# Patient Record
Sex: Male | Born: 1935 | Race: White | Hispanic: No | Marital: Married | Smoking: Never smoker
Health system: Southern US, Community
[De-identification: ages and names within clinical notes are randomized; demographics above are authoritative.]

## PROBLEM LIST (undated history)

## (undated) DIAGNOSIS — I712 Thoracic aortic aneurysm, without rupture, unspecified: Secondary | ICD-10-CM

## (undated) DIAGNOSIS — Z952 Presence of prosthetic heart valve: Secondary | ICD-10-CM

## (undated) DIAGNOSIS — Z9289 Personal history of other medical treatment: Secondary | ICD-10-CM

## (undated) DIAGNOSIS — Z950 Presence of cardiac pacemaker: Secondary | ICD-10-CM

## (undated) DIAGNOSIS — I719 Aortic aneurysm of unspecified site, without rupture: Secondary | ICD-10-CM

## (undated) DIAGNOSIS — K922 Gastrointestinal hemorrhage, unspecified: Secondary | ICD-10-CM

## (undated) DIAGNOSIS — Z8739 Personal history of other diseases of the musculoskeletal system and connective tissue: Secondary | ICD-10-CM

## (undated) DIAGNOSIS — R972 Elevated prostate specific antigen [PSA]: Secondary | ICD-10-CM

## (undated) DIAGNOSIS — G453 Amaurosis fugax: Secondary | ICD-10-CM

## (undated) DIAGNOSIS — E039 Hypothyroidism, unspecified: Secondary | ICD-10-CM

## (undated) DIAGNOSIS — I471 Supraventricular tachycardia, unspecified: Secondary | ICD-10-CM

## (undated) DIAGNOSIS — I351 Nonrheumatic aortic (valve) insufficiency: Secondary | ICD-10-CM

## (undated) DIAGNOSIS — I509 Heart failure, unspecified: Secondary | ICD-10-CM

## (undated) DIAGNOSIS — G5600 Carpal tunnel syndrome, unspecified upper limb: Secondary | ICD-10-CM

## (undated) DIAGNOSIS — I4891 Unspecified atrial fibrillation: Secondary | ICD-10-CM

## (undated) DIAGNOSIS — N400 Enlarged prostate without lower urinary tract symptoms: Secondary | ICD-10-CM

## (undated) DIAGNOSIS — D649 Anemia, unspecified: Secondary | ICD-10-CM

## (undated) HISTORY — DX: Amaurosis fugax: G45.3

## (undated) HISTORY — PX: INGUINAL HERNIA REPAIR: SUR1180

## (undated) HISTORY — DX: Elevated prostate specific antigen (PSA): R97.20

## (undated) HISTORY — DX: Supraventricular tachycardia: I47.1

## (undated) HISTORY — DX: Hypothyroidism, unspecified: E03.9

## (undated) HISTORY — PX: JOINT REPLACEMENT: SHX530

## (undated) HISTORY — PX: CARPAL TUNNEL RELEASE: SHX101

## (undated) HISTORY — PX: TOTAL KNEE ARTHROPLASTY: SHX125

## (undated) HISTORY — DX: Carpal tunnel syndrome, unspecified upper limb: G56.00

## (undated) HISTORY — DX: Nonrheumatic aortic (valve) insufficiency: I35.1

## (undated) HISTORY — DX: Aortic aneurysm of unspecified site, without rupture: I71.9

## (undated) HISTORY — DX: Thoracic aortic aneurysm, without rupture: I71.2

## (undated) HISTORY — DX: Thoracic aortic aneurysm, without rupture, unspecified: I71.20

## (undated) HISTORY — DX: Supraventricular tachycardia, unspecified: I47.10

## (undated) HISTORY — PX: INSERT / REPLACE / REMOVE PACEMAKER: SUR710

## (undated) HISTORY — DX: Presence of prosthetic heart valve: Z95.2

## (undated) HISTORY — DX: Benign prostatic hyperplasia without lower urinary tract symptoms: N40.0

## (undated) HISTORY — PX: MEDIAN STERNOTOMY: SUR860

---

## 1998-04-20 ENCOUNTER — Other Ambulatory Visit: Admission: RE | Admit: 1998-04-20 | Discharge: 1998-04-20 | Payer: Self-pay | Admitting: Family Medicine

## 2000-07-26 ENCOUNTER — Ambulatory Visit (HOSPITAL_BASED_OUTPATIENT_CLINIC_OR_DEPARTMENT_OTHER): Admission: RE | Admit: 2000-07-26 | Discharge: 2000-07-26 | Payer: Self-pay | Admitting: Orthopedic Surgery

## 2001-04-30 ENCOUNTER — Other Ambulatory Visit: Admission: RE | Admit: 2001-04-30 | Discharge: 2001-04-30 | Payer: Self-pay | Admitting: Urology

## 2001-04-30 ENCOUNTER — Encounter (INDEPENDENT_AMBULATORY_CARE_PROVIDER_SITE_OTHER): Payer: Self-pay

## 2005-01-04 ENCOUNTER — Ambulatory Visit: Payer: Self-pay | Admitting: Cardiology

## 2005-01-13 ENCOUNTER — Ambulatory Visit: Payer: Self-pay

## 2005-01-14 ENCOUNTER — Encounter: Admission: RE | Admit: 2005-01-14 | Discharge: 2005-01-14 | Payer: Self-pay | Admitting: Cardiology

## 2005-01-17 ENCOUNTER — Ambulatory Visit: Payer: Self-pay | Admitting: Cardiology

## 2005-01-19 ENCOUNTER — Encounter (INDEPENDENT_AMBULATORY_CARE_PROVIDER_SITE_OTHER): Payer: Self-pay | Admitting: Specialist

## 2005-01-19 ENCOUNTER — Inpatient Hospital Stay (HOSPITAL_COMMUNITY): Admission: AD | Admit: 2005-01-19 | Discharge: 2005-01-27 | Payer: Self-pay | Admitting: Cardiology

## 2005-01-19 DIAGNOSIS — Z952 Presence of prosthetic heart valve: Secondary | ICD-10-CM

## 2005-01-19 HISTORY — PX: MECHANICAL AORTIC VALVE REPLACEMENT: SHX2013

## 2005-01-19 HISTORY — DX: Presence of prosthetic heart valve: Z95.2

## 2005-01-19 HISTORY — PX: ASCENDING AORTIC ANEURYSM REPAIR W/ MECHANICAL AORTIC VALVE REPLACEMENT: SHX1192

## 2005-01-24 ENCOUNTER — Encounter: Payer: Self-pay | Admitting: Cardiology

## 2005-02-10 ENCOUNTER — Ambulatory Visit: Payer: Self-pay | Admitting: Cardiology

## 2005-02-11 ENCOUNTER — Encounter: Payer: Self-pay | Admitting: Cardiology

## 2005-02-11 ENCOUNTER — Inpatient Hospital Stay (HOSPITAL_COMMUNITY): Admission: EM | Admit: 2005-02-11 | Discharge: 2005-02-17 | Payer: Self-pay | Admitting: Emergency Medicine

## 2005-02-21 ENCOUNTER — Ambulatory Visit: Payer: Self-pay | Admitting: Internal Medicine

## 2005-02-21 ENCOUNTER — Ambulatory Visit: Payer: Self-pay | Admitting: Cardiology

## 2005-03-23 ENCOUNTER — Ambulatory Visit: Payer: Self-pay | Admitting: Cardiology

## 2005-04-20 ENCOUNTER — Ambulatory Visit: Payer: Self-pay | Admitting: Cardiology

## 2005-06-09 ENCOUNTER — Ambulatory Visit: Payer: Self-pay | Admitting: Internal Medicine

## 2005-06-16 ENCOUNTER — Ambulatory Visit: Payer: Self-pay | Admitting: Cardiology

## 2005-07-11 ENCOUNTER — Ambulatory Visit: Payer: Self-pay | Admitting: Internal Medicine

## 2005-07-26 ENCOUNTER — Ambulatory Visit: Payer: Self-pay | Admitting: Internal Medicine

## 2005-08-16 ENCOUNTER — Ambulatory Visit: Payer: Self-pay | Admitting: Internal Medicine

## 2005-08-25 ENCOUNTER — Ambulatory Visit (HOSPITAL_COMMUNITY): Admission: RE | Admit: 2005-08-25 | Discharge: 2005-08-25 | Payer: Self-pay | Admitting: Neurology

## 2005-08-25 ENCOUNTER — Ambulatory Visit: Payer: Self-pay

## 2005-09-01 ENCOUNTER — Ambulatory Visit: Payer: Self-pay | Admitting: Cardiology

## 2005-09-15 ENCOUNTER — Ambulatory Visit: Payer: Self-pay | Admitting: Cardiology

## 2006-01-11 ENCOUNTER — Ambulatory Visit: Payer: Self-pay | Admitting: Cardiology

## 2006-03-23 ENCOUNTER — Ambulatory Visit: Payer: Self-pay

## 2006-08-21 ENCOUNTER — Ambulatory Visit: Payer: Self-pay | Admitting: Cardiology

## 2006-08-31 ENCOUNTER — Encounter: Payer: Self-pay | Admitting: Cardiology

## 2006-08-31 ENCOUNTER — Ambulatory Visit: Payer: Self-pay

## 2006-10-11 ENCOUNTER — Ambulatory Visit: Payer: Self-pay | Admitting: Internal Medicine

## 2007-01-03 ENCOUNTER — Ambulatory Visit: Payer: Self-pay | Admitting: Internal Medicine

## 2007-04-04 ENCOUNTER — Ambulatory Visit: Payer: Self-pay | Admitting: Internal Medicine

## 2007-07-04 ENCOUNTER — Ambulatory Visit: Payer: Self-pay | Admitting: Internal Medicine

## 2007-08-03 ENCOUNTER — Ambulatory Visit: Payer: Self-pay | Admitting: Cardiology

## 2007-08-10 ENCOUNTER — Ambulatory Visit: Payer: Self-pay

## 2007-08-10 ENCOUNTER — Encounter: Payer: Self-pay | Admitting: Cardiology

## 2007-08-23 ENCOUNTER — Ambulatory Visit: Payer: Self-pay | Admitting: Cardiology

## 2007-10-02 ENCOUNTER — Ambulatory Visit: Payer: Self-pay | Admitting: Internal Medicine

## 2008-01-02 ENCOUNTER — Ambulatory Visit: Payer: Self-pay | Admitting: Internal Medicine

## 2008-03-04 ENCOUNTER — Ambulatory Visit: Payer: Self-pay | Admitting: Cardiology

## 2008-04-02 ENCOUNTER — Ambulatory Visit: Payer: Self-pay | Admitting: Internal Medicine

## 2008-07-02 ENCOUNTER — Ambulatory Visit: Payer: Self-pay | Admitting: Internal Medicine

## 2008-08-01 ENCOUNTER — Ambulatory Visit: Payer: Self-pay | Admitting: Cardiology

## 2008-08-12 ENCOUNTER — Ambulatory Visit: Payer: Self-pay

## 2008-08-12 ENCOUNTER — Encounter: Payer: Self-pay | Admitting: Cardiology

## 2008-08-12 ENCOUNTER — Ambulatory Visit: Payer: Self-pay | Admitting: Cardiology

## 2008-10-01 ENCOUNTER — Ambulatory Visit: Payer: Self-pay

## 2008-12-31 ENCOUNTER — Ambulatory Visit: Payer: Self-pay | Admitting: Internal Medicine

## 2009-04-01 ENCOUNTER — Ambulatory Visit: Payer: Self-pay | Admitting: Internal Medicine

## 2009-04-07 ENCOUNTER — Encounter: Payer: Self-pay | Admitting: Internal Medicine

## 2009-04-10 ENCOUNTER — Ambulatory Visit: Payer: Self-pay | Admitting: Internal Medicine

## 2009-04-22 ENCOUNTER — Ambulatory Visit (HOSPITAL_COMMUNITY): Admission: RE | Admit: 2009-04-22 | Discharge: 2009-04-22 | Payer: Self-pay | Admitting: Cardiology

## 2009-05-26 ENCOUNTER — Telehealth: Payer: Self-pay | Admitting: Cardiology

## 2009-07-01 ENCOUNTER — Ambulatory Visit: Payer: Self-pay | Admitting: Internal Medicine

## 2009-07-09 ENCOUNTER — Encounter: Payer: Self-pay | Admitting: Internal Medicine

## 2009-09-05 DIAGNOSIS — H3400 Transient retinal artery occlusion, unspecified eye: Secondary | ICD-10-CM

## 2009-09-05 DIAGNOSIS — I359 Nonrheumatic aortic valve disorder, unspecified: Secondary | ICD-10-CM | POA: Insufficient documentation

## 2009-09-05 DIAGNOSIS — Z8679 Personal history of other diseases of the circulatory system: Secondary | ICD-10-CM | POA: Insufficient documentation

## 2009-09-05 DIAGNOSIS — E039 Hypothyroidism, unspecified: Secondary | ICD-10-CM | POA: Insufficient documentation

## 2009-09-05 DIAGNOSIS — Z9889 Other specified postprocedural states: Secondary | ICD-10-CM

## 2009-09-05 DIAGNOSIS — I509 Heart failure, unspecified: Secondary | ICD-10-CM | POA: Insufficient documentation

## 2009-09-05 DIAGNOSIS — I712 Thoracic aortic aneurysm, without rupture: Secondary | ICD-10-CM

## 2009-09-05 DIAGNOSIS — I079 Rheumatic tricuspid valve disease, unspecified: Secondary | ICD-10-CM | POA: Insufficient documentation

## 2009-09-05 DIAGNOSIS — I517 Cardiomegaly: Secondary | ICD-10-CM

## 2009-09-05 DIAGNOSIS — I10 Essential (primary) hypertension: Secondary | ICD-10-CM | POA: Insufficient documentation

## 2009-09-05 DIAGNOSIS — Z9861 Coronary angioplasty status: Secondary | ICD-10-CM

## 2009-09-05 DIAGNOSIS — Z87898 Personal history of other specified conditions: Secondary | ICD-10-CM

## 2009-09-05 DIAGNOSIS — R0602 Shortness of breath: Secondary | ICD-10-CM | POA: Insufficient documentation

## 2009-09-05 DIAGNOSIS — I498 Other specified cardiac arrhythmias: Secondary | ICD-10-CM

## 2009-09-05 DIAGNOSIS — I080 Rheumatic disorders of both mitral and aortic valves: Secondary | ICD-10-CM

## 2009-09-05 DIAGNOSIS — Z96659 Presence of unspecified artificial knee joint: Secondary | ICD-10-CM

## 2009-09-05 DIAGNOSIS — Z950 Presence of cardiac pacemaker: Secondary | ICD-10-CM

## 2009-09-07 ENCOUNTER — Ambulatory Visit: Payer: Self-pay | Admitting: Cardiology

## 2009-09-07 DIAGNOSIS — Z954 Presence of other heart-valve replacement: Secondary | ICD-10-CM | POA: Insufficient documentation

## 2009-09-07 DIAGNOSIS — I428 Other cardiomyopathies: Secondary | ICD-10-CM | POA: Insufficient documentation

## 2009-10-06 ENCOUNTER — Ambulatory Visit: Payer: Self-pay | Admitting: Internal Medicine

## 2009-10-06 ENCOUNTER — Ambulatory Visit (HOSPITAL_COMMUNITY): Admission: RE | Admit: 2009-10-06 | Discharge: 2009-10-06 | Payer: Self-pay | Admitting: Cardiology

## 2009-10-06 ENCOUNTER — Encounter: Payer: Self-pay | Admitting: Cardiology

## 2009-10-06 ENCOUNTER — Ambulatory Visit: Payer: Self-pay | Admitting: Cardiology

## 2009-10-06 ENCOUNTER — Ambulatory Visit: Payer: Self-pay

## 2010-01-06 ENCOUNTER — Ambulatory Visit: Payer: Self-pay | Admitting: Internal Medicine

## 2010-01-12 ENCOUNTER — Encounter: Payer: Self-pay | Admitting: Internal Medicine

## 2010-04-07 ENCOUNTER — Ambulatory Visit: Payer: Self-pay | Admitting: Internal Medicine

## 2010-04-07 ENCOUNTER — Encounter: Payer: Self-pay | Admitting: Internal Medicine

## 2010-04-14 ENCOUNTER — Encounter: Payer: Self-pay | Admitting: Internal Medicine

## 2010-07-08 ENCOUNTER — Ambulatory Visit: Payer: Self-pay | Admitting: Internal Medicine

## 2010-07-15 ENCOUNTER — Encounter: Payer: Self-pay | Admitting: Internal Medicine

## 2010-09-20 ENCOUNTER — Ambulatory Visit: Payer: Self-pay | Admitting: Cardiology

## 2010-10-19 ENCOUNTER — Ambulatory Visit: Payer: Self-pay | Admitting: Internal Medicine

## 2010-10-28 ENCOUNTER — Ambulatory Visit: Payer: Self-pay | Admitting: Cardiology

## 2010-10-28 ENCOUNTER — Encounter: Payer: Self-pay | Admitting: Cardiology

## 2010-10-28 ENCOUNTER — Ambulatory Visit (HOSPITAL_COMMUNITY): Admission: RE | Admit: 2010-10-28 | Discharge: 2010-10-28 | Payer: Self-pay | Admitting: Cardiology

## 2010-10-28 ENCOUNTER — Ambulatory Visit: Payer: Self-pay

## 2011-01-20 ENCOUNTER — Encounter: Payer: Self-pay | Admitting: Internal Medicine

## 2011-01-20 ENCOUNTER — Ambulatory Visit
Admission: RE | Admit: 2011-01-20 | Discharge: 2011-01-20 | Payer: Self-pay | Source: Home / Self Care | Attending: Internal Medicine | Admitting: Internal Medicine

## 2011-01-23 LAB — CONVERTED CEMR LAB
Basophils Relative: 0.8 % (ref 0.0–3.0)
Eosinophils Absolute: 0.2 10*3/uL (ref 0.0–0.7)
Eosinophils Relative: 2.6 % (ref 0.0–5.0)
Hemoglobin: 15.3 g/dL (ref 13.0–17.0)
Lymphocytes Relative: 22.9 % (ref 12.0–46.0)
MCHC: 34.2 g/dL (ref 30.0–36.0)
Monocytes Relative: 10.4 % (ref 3.0–12.0)
Neutro Abs: 4 10*3/uL (ref 1.4–7.7)
Neutrophils Relative %: 63.3 % (ref 43.0–77.0)
RBC: 4.82 M/uL (ref 4.22–5.81)
TSH: 2.84 microintl units/mL (ref 0.35–5.50)
WBC: 6.4 10*3/uL (ref 4.5–10.5)

## 2011-01-27 NOTE — Cardiovascular Report (Signed)
Summary: Office Visit Remote  Office Visit Remote   Imported By: Roderic Ovens 04/14/2010 14:57:25  _____________________________________________________________________  External Attachment:    Type:   Image     Comment:   External Document

## 2011-01-27 NOTE — Cardiovascular Report (Signed)
Summary: Office Visit   Office Visit   Imported By: Roderic Ovens 10/29/2010 09:52:15  _____________________________________________________________________  External Attachment:    Type:   Image     Comment:   External Document

## 2011-01-27 NOTE — Assessment & Plan Note (Signed)
Summary: [employer]/saf   Visit Type:  Follow-up   History of Present Illness: [name] [name] is a gentleman who has a history of Bentall procedure with a St. Jude aortic valve replacement as well as pacemaker placement  in 2006.  His most recent echocardiogram was performed in October of 2010.  At that time, he was found to have an ejection fraction of 50-55%.  He denies any dyspnea on exertion, orthopnea, PND, pedal edema, palpitations, syncope, chest pain or bleeding. He is without complaint.   Current Medications (verified): 1)  Aspirin 81 Mg Tbec (Aspirin) .... Take One Tablet By Mouth Daily 2)  Cardizem Cd 240 Mg Xr24h-Cap (Diltiazem Hcl Coated Beads) .... One Tablet By Mouth Once Daily 3)  Warfarin Sodium 6 Mg Tabs (Warfarin Sodium) .... Use As Directed By Anticoagulation Clinic 4)  Synthroid 50 Mcg Tabs (Levothyroxine Sodium) .... Take 1 By Mouth Once Daily 5)  Doxycycline Hyclate 100 Mg Solr (Doxycycline Hyclate) .... Two Times A Day  Allergies (verified): No Known Drug Allergies  Past History:  Past Medical History: Last updated: 09/20/2010 ELEVATED PROSTATE SPECIFIC ANTIGEN, HX OF (ICD-790.93) AORTIC INSUFFICIENCY (ICD-424.1) THORACIC AORTIC ANEURYSM,HX OF (ICD-441.2) CARPAL TUNNEL SYNDROME, RIGHT (ICD-354.0) SUPRAVENTRICULAR TACHYCARDIA, HX OF (ICD-V12.59) AMAUROSIS FUGAX (ICD-362.34) BENIGN PROSTATIC HYPERTROPHY, HX OF (ICD-V13.8) HYPOTHYROIDISM (ICD-244.9)  Past Surgical History: Last updated: 09/20/2010 PACEMAKER, PERMANENT (ICD-V45.01) 02/14/2005 * MEDIAN STERNOTOMY FOR BENTALL A//ORTIC ROOT REPLACEMENT,HX OF 01/19/2005 INGUINAL HERNIORRHAPHY , HX OF ( TIMES 3) (ICD-V45.89) TOTAL KNEE REPLACEMENT, RIGHT, HX OF (ICD-V43.65) CARPAL TUNNEL RELEASE, RIGHT, HX OF (ICD-V45.89) 07/26/2000  Review of Systems  The patient denies chest pain, syncope, dyspnea on exertion, and peripheral edema.    Vital Signs:  Patient profile:   77 year old male Height:      72  inches Weight:      187 pounds BMI:     25.45 Pulse rate:   67 / minute BP sitting:   120 / 70  (left arm)  Vitals Entered By: [name] [name] CMA (October 19, 2010 2:34 PM)  Physical Exam  General:  Well-developed well-nourished in no acute distress.  Skin is warm and dry.  HEENT is normal.  Neck is supple. No thyromegaly.  Chest is clear to auscultation with normal expansion.  Well healed PPM incision. Cardiovascular exam is regular rate and rhythm. crisp mechanical valve sounds Abdominal exam nontender or distended. No masses palpated. Extremities show no edema. neuro grossly intact    PPM Specifications Following MD:  [name] Bunting, MD     PPM Vendor:  [employer]     PPM Model Number:  [id number]     PPM Serial Number:  [id number] PPM DOI:  02/14/2005     PPM Implanting MD:  [name] Bunting, MD  Lead 1    Location: RA     DOI: 02/14/2005     Model #: [id number]     Serial #: [id number]     Status: active Lead 2    Location: RV     DOI: 02/14/2005     Model #: [id number]     Serial #: [id number]     Status: active  Magnet Response Rate:  BOL 85 ERI 65  Indications:  Tachy-brady syndrome  Explantation Comments:  Carelink  PPM Follow Up Battery Voltage:  2.99 V     Pacer Dependent:  No       PPM Device Measurements Atrium  Amplitude: 3.2 mV, Impedance: 640 ohms, Threshold: 0.5 V at 0.4 msec Right Ventricle  Amplitude:  7.5 mV, Impedance: 544 ohms, Threshold: 1.0 V at 0.4 msec  Episodes MS Episodes:  0     Coumadin:  Yes Ventricular High Rate:  39     Atrial Pacing:  89.1%     Ventricular Pacing:  1.1%  Parameters Mode:  MVP     Lower Rate Limit:  60     Upper Rate Limit:  130 Paced AV Delay:  180     Sensed AV Delay:  150 Next Remote Date:  01/20/2011     Next Cardiology Appt Due:  09/26/2011 Tech Comments:  39 MONITORED VT EPISODES--LONGEST WAS 2 SECONDS.  NORMAL DEVICE FUNCTION.  NO CHANGES MADE. CARELINK 01-20-11 AND ROV IN 12 MTHS W/GT. [name] [name]  October 19, 2010 2:44 PM MD  Comments:  Agree with above.  Impression & Recommendations:  Problem # 1:  PACEMAKER, PERMANENT (ICD-V45.01) His device is working normally.  Will recheck in several months. His AV conduction is present.  Problem # 2:  ATRIAL FIBRILLATION, PAROXYSMAL, HX OF (ICD-V12.50) HIs symptoms are well controlled and he is on coumadin.  Will follow.  Problem # 3:  HYPERTENSION (ICD-401.9) I have asked him to maintain a low sodium diet. His updated medication list for this problem includes:    Aspirin 81 Mg Tbec (Aspirin) .[name]... Take one tablet by mouth daily    Cardizem Cd 240 Mg Xr24h-cap (Diltiazem hcl coated beads) ..... One tablet by mouth once daily  Patient Instructions: 1)  Your physician wants you to follow-up in: 12 months with Dr Court Joy will receive a reminder letter in the mail two months in advance. If you don't receive a letter, please call our office to schedule the follow-up appointment. 2)  [place] transmission 01/20/10

## 2011-01-27 NOTE — Cardiovascular Report (Signed)
Summary: Office Visit Remote   Office Visit Remote   Imported By: Roderic Ovens 07/15/2010 16:07:20  _____________________________________________________________________  External Attachment:    Type:   Image     Comment:   External Document

## 2011-01-27 NOTE — Letter (Signed)
Summary: Remote Device Check  [employer], Main Office  [address]   [place], [place] [address]   Phone: [phone]  Fax: [phone]     January 12, 2010 MRN: [id number]   [facility] [address] [place], [place]  [address]   Dear Mr. Bolin,   Your remote transmission was recieved and reviewed by your physician.  All diagnostics were within normal limits for you.  __X___Your next transmission is scheduled for:  April 07, 2010.  Please transmit at any time this day.  If you have a wireless device your transmission will be sent automatically.     Sincerely,  Proofreader

## 2011-01-27 NOTE — Assessment & Plan Note (Signed)
Summary: F1M/ECHO AT 1PM/DM   History of Present Illness: [name] [name] is a gentleman who has a history of Bentall procedure with a St. Jude aortic valve replacement as well as pacemaker placement  in 2006.  His most recent echocardiogram was performed in October of 2010.  At that time, he was found to have an ejection fraction of 50-55%.  There was a St. Jude mechanical prosthesis with mild aortic insufficiency.  The left atrium was mildly dilated.  The right ventricle and right atrium were also mildly dilated, and there was mild MR. I last saw him in Sept 2011. He was complaining of fatigue. TSH and hemoglobin were normal. We weaned his Coreg to off and substituted Cardizem. Since then the patient denies any dyspnea on exertion, orthopnea, PND, pedal edema, palpitations, syncope or chest pain. He feels his fatigue may be somewhat better.   Current Medications (verified): 1)  Aspirin 81 Mg Tbec (Aspirin) .... Take One Tablet By Mouth Daily 2)  Cardizem Cd 240 Mg Xr24h-Cap (Diltiazem Hcl Coated Beads) .... One Tablet By Mouth Once Daily 3)  Warfarin Sodium 6 Mg Tabs (Warfarin Sodium) .... Use As Directed By Anticoagulation Clinic 4)  Synthroid 50 Mcg Tabs (Levothyroxine Sodium) .... Take 1 By Mouth Once Daily  Allergies: No Known Drug Allergies  Past History:  Past Medical History: Reviewed history from 09/20/2010 and no changes required. ELEVATED PROSTATE SPECIFIC ANTIGEN, HX OF (ICD-790.93) AORTIC INSUFFICIENCY (ICD-424.1) THORACIC AORTIC ANEURYSM,HX OF (ICD-441.2) CARPAL TUNNEL SYNDROME, RIGHT (ICD-354.0) SUPRAVENTRICULAR TACHYCARDIA, HX OF (ICD-V12.59) AMAUROSIS FUGAX (ICD-362.34) BENIGN PROSTATIC HYPERTROPHY, HX OF (ICD-V13.8) HYPOTHYROIDISM (ICD-244.9)  Past Surgical History: Reviewed history from 09/20/2010 and no changes required. PACEMAKER, PERMANENT (ICD-V45.01) 02/14/2005 * MEDIAN STERNOTOMY FOR BENTALL A//ORTIC ROOT REPLACEMENT,HX OF 01/19/2005 INGUINAL HERNIORRHAPHY , HX OF  ( TIMES 3) (ICD-V45.89) TOTAL KNEE REPLACEMENT, RIGHT, HX OF (ICD-V43.65) CARPAL TUNNEL RELEASE, RIGHT, HX OF (ICD-V45.89) 07/26/2000  Social History: Reviewed history from 09/04/2009 and no changes required. 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  Alcohol Use - no Regular Exercise - no  Review of Systems       no fevers or chills, productive cough, hemoptysis, dysphasia, odynophagia, melena, hematochezia, dysuria, hematuria, rash, seizure activity, orthopnea, PND, pedal edema, claudication. Remaining systems are negative.   Vital Signs:  Patient profile:   77 year old male Height:      72 inches Weight:      187 pounds BMI:     25.45 Pulse rate:   70 / minute Resp:     12 per minute BP sitting:   154 / 70  (left arm)  Vitals Entered By: Kem Parkinson (October 28, 2010 2:34 PM)  Physical Exam  General:  Well-developed well-nourished in no acute distress.  Skin is warm and dry.  HEENT is normal.  Neck is supple. No thyromegaly.  Chest is clear to auscultation with normal expansion.  Cardiovascular exam is regular rate and rhythm. Crisp mechanical valve sounds Abdominal exam nontender or distended. No masses palpated. Extremities show no edema. neuro grossly intact    PPM Specifications Following MD:  Lewayne Bunting, MD     PPM Vendor:  [employer]     PPM Model Number:  [id number]     PPM Serial Number:  [id number] PPM DOI:  02/14/2005     PPM Implanting MD:  Lewayne Bunting, MD  Lead 1    Location: RA     DOI: 02/14/2005     Model #: 8413  Serial #: [id number]     Status: active Lead 2    Location: RV     DOI: 02/14/2005     Model #: [id number]     Serial #: [id number]     Status: active  Magnet Response Rate:  BOL 85 ERI 65  Indications:  Tachy-brady syndrome  Explantation Comments:  Carelink  PPM Follow Up Pacer Dependent:  No      Episodes Coumadin:  Yes  Parameters Mode:  MVP     Lower Rate Limit:  60     Upper Rate Limit:  130 Paced AV Delay:   180     Sensed AV Delay:  150  Impression & Recommendations:  Problem # 1:  CARDIOMYOPATHY (ICD-425.4)  Most recent echo showed improved LV function. He is scheduled for repeat study today. His updated medication list for this problem includes:    Aspirin 81 Mg Tbec (Aspirin) .[name]... Take one tablet by mouth daily    Cardizem Cd 240 Mg Xr24h-cap (Diltiazem hcl coated beads) ..... One tablet by mouth once daily    Warfarin Sodium 6 Mg Tabs (Warfarin sodium) ..... Use as directed by anticoagulation clinic  His updated medication list for this problem includes:    Aspirin 81 Mg Tbec (Aspirin) .[name]... Take one tablet by mouth daily    Cardizem Cd 240 Mg Xr24h-cap (Diltiazem hcl coated beads) ..... One tablet by mouth once daily    Warfarin Sodium 6 Mg Tabs (Warfarin sodium) ..... Use as directed by anticoagulation clinic  Problem # 2:  AORTIC VALVE REPLACEMENT, HX OF (ICD-V43.3) Continue SBE prophylaxis. Repeat echocardiogram today.  Problem # 3:  PACEMAKER, PERMANENT (ICD-V45.01) Management per electrophysiology.  Problem # 4:  THORACIC AORTIC ANEURYSM,HX OF (ICD-441.2)  Problem # 5:  SUPRAVENTRICULAR TACHYCARDIA, HX OF (ICD-V12.59)  His updated medication list for this problem includes:    Aspirin 81 Mg Tbec (Aspirin) .[name]... Take one tablet by mouth daily    Cardizem Cd 240 Mg Xr24h-cap (Diltiazem hcl coated beads) ..... One tablet by mouth once daily    Warfarin Sodium 6 Mg Tabs (Warfarin sodium) ..... Use as directed by anticoagulation clinic  His updated medication list for this problem includes:    Aspirin 81 Mg Tbec (Aspirin) .[name]... Take one tablet by mouth daily    Cardizem Cd 240 Mg Xr24h-cap (Diltiazem hcl coated beads) ..... One tablet by mouth once daily    Warfarin Sodium 6 Mg Tabs (Warfarin sodium) ..... Use as directed by anticoagulation clinic  Problem # 6:  HYPERTENSION (ICD-401.9)  Blood pressure mildly elevated. He will follow this at home and we will increase Cardizem  as needed. His updated medication list for this problem includes:    Aspirin 81 Mg Tbec (Aspirin) .[name]... Take one tablet by mouth daily    Cardizem Cd 240 Mg Xr24h-cap (Diltiazem hcl coated beads) ..... One tablet by mouth once daily  His updated medication list for this problem includes:    Aspirin 81 Mg Tbec (Aspirin) .[name]... Take one tablet by mouth daily    Cardizem Cd 240 Mg Xr24h-cap (Diltiazem hcl coated beads) ..... One tablet by mouth once daily  Problem # 7:  HYPOTHYROIDISM (ICD-244.9)  His updated medication list for this problem includes:    Synthroid 50 Mcg Tabs (Levothyroxine sodium) .[name]... Take 1 by mouth once daily  His updated medication list for this problem includes:    Synthroid 50 Mcg Tabs (Levothyroxine sodium) .[name]... Take 1 by mouth once daily  Problem #  8:  COUMADIN THERAPY (ICD-V58.61) Followed by his primary care.  Patient Instructions: 1)  Your physician recommends that you schedule a follow-up appointment in: YEAR WITH DR CRENSHAW 2)  Your physician recommends that you continue on your current medications as directed. Please refer to the Current Medication list given to you today.

## 2011-01-27 NOTE — Letter (Signed)
Summary: Remote Device Check  [employer], Main Office  [address]   [place], [place] [address]   Phone: [phone]  Fax: [phone]     April 14, 2010 MRN: [id number]   [facility] [address] [place], [place]  [address]   Dear Mr. Meadow,   Your remote transmission was recieved and reviewed by your physician.  All diagnostics were within normal limits for you.  __X___Your next transmission is scheduled for:  July 08, 2010.  Please transmit at any time this day.  If you have a wireless device your transmission will be sent automatically.     Sincerely,  Proofreader

## 2011-01-27 NOTE — Letter (Signed)
Summary: Remote Device Check  [employer], Main Office  [address]   [place], [place] [address]   Phone: [phone]  Fax: [phone]     July 15, 2010 MRN: [id number]   [facility] [address] [place], [place]  [address]   Dear [name] [name],   Your remote transmission was recieved and reviewed by your physician.  All diagnostics were within normal limits for you.  __X___Your next transmission is scheduled for:   10-14-2010.  Please transmit at any time this day.  If you have a wireless device your transmission will be sent automatically.   Sincerely,  Vella Kohler

## 2011-01-27 NOTE — Assessment & Plan Note (Signed)
Summary: F1Y/DM   CC:  check up.  History of Present Illness: [name] [name] is a gentleman who has a history of Bentall procedure with a St. Jude aortic valve replacement as well as pacemaker placement  in 2006.  His most recent echocardiogram was performed in October of 2010.  At that time, he was found to have an ejection fraction of 50-55%.  There was a St. Jude mechanical prosthesis with mild aortic insufficiency.  The left atrium was mildly dilated.  The right ventricle and right atrium were also mildly dilated, and there was mild MR. I last saw him in Sept 2010. Since then he denies any dyspnea on exertion, orthopnea, PND, pedal edema, palpitations, syncope, chest pain or bleeding. He is complaining of increased fatigue.   Current Medications (verified): 1)  Aspirin 81 Mg Tbec (Aspirin) .... Take One Tablet By Mouth Daily 2)  Carvedilol 25 Mg Tabs (Carvedilol) .... Take One Tablets By Mouth Twice A Day 3)  Warfarin Sodium 5 Mg Tabs (Warfarin Sodium) .... Use As Directed By Anticoagulation Clinic 4)  Synthroid 50 Mcg Tabs (Levothyroxine Sodium) .... Take 1 By Mouth Once Daily  Allergies: No Known Drug Allergies  Past History:  Past Medical History: ELEVATED PROSTATE SPECIFIC ANTIGEN, HX OF (ICD-790.93) AORTIC INSUFFICIENCY (ICD-424.1) THORACIC AORTIC ANEURYSM,HX OF (ICD-441.2) CARPAL TUNNEL SYNDROME, RIGHT (ICD-354.0) SUPRAVENTRICULAR TACHYCARDIA, HX OF (ICD-V12.59) AMAUROSIS FUGAX (ICD-362.34) BENIGN PROSTATIC HYPERTROPHY, HX OF (ICD-V13.8) HYPOTHYROIDISM (ICD-244.9)  Past Surgical History: PACEMAKER, PERMANENT (ICD-V45.01) 02/14/2005 * MEDIAN STERNOTOMY FOR BENTALL A//ORTIC ROOT REPLACEMENT,HX OF 01/19/2005 INGUINAL HERNIORRHAPHY , HX OF ( TIMES 3) (ICD-V45.89) TOTAL KNEE REPLACEMENT, RIGHT, HX OF (ICD-V43.65) CARPAL TUNNEL RELEASE, RIGHT, HX OF (ICD-V45.89) 07/26/2000  Social History: Reviewed history from 09/04/2009 and no changes required. 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  Alcohol Use - no Regular Exercise - no  Review of Systems       Pain in his shoulders and fatigue but no fevers or chills, productive cough, hemoptysis, dysphasia, odynophagia, melena, hematochezia, dysuria, hematuria, rash, seizure activity, orthopnea, PND, pedal edema, claudication. Remaining systems are negative.   Vital Signs:  Patient profile:   77 year old male Height:      72 inches Weight:      186 pounds BMI:     25.32 Pulse rate:   66 / minute Resp:     12 per minute BP sitting:   154 / 89  (left arm)  Vitals Entered By: Kem Parkinson (September 20, 2010 3:14 PM)  Physical Exam  General:  Well-developed well-nourished in no acute distress.  Skin is warm and dry.  HEENT is normal.  Neck is supple. No thyromegaly.  Chest is clear to auscultation with normal expansion.  Cardiovascular exam is regular rate and rhythm. crisp mechanical valve sounds Abdominal exam nontender or distended. No masses palpated. Extremities show no edema. neuro grossly intact    EKG  Procedure date:  09/20/2010  Findings:      Atrial paced rhythm at a rate of 66. Left axis deviation. Nonspecific ST changes.  PPM Specifications Following MD:  Lewayne Bunting, MD     PPM Vendor:  [employer]     PPM Model Number:  [id number]     PPM Serial Number:  [id number] PPM DOI:  02/14/2005     PPM Implanting MD:  Lewayne Bunting, MD  Lead 1    Location: RA     DOI: 02/14/2005     Model #: 4098  Serial #: [id number]     Status: active Lead 2    Location: RV     DOI: 02/14/2005     Model #: 8469     Serial #: [id number]     Status: active  Magnet Response Rate:  BOL 85 ERI 65  Indications:  Tachy-brady syndrome  Explantation Comments:  Carelink  PPM Follow Up Pacer Dependent:  No      Episodes Coumadin:  Yes  Parameters Mode:  DDDR+     Lower Rate Limit:  60     Upper Rate Limit:  130 Paced AV Delay:  180     Sensed AV Delay:  150  Impression &  Recommendations:  Problem # 1:  AORTIC VALVE REPLACEMENT, HX OF (ICD-V43.3)  Continue SBE prophylaxis and Coumadin. Repeat echocardiogram.  Orders: Echocardiogram (Echo)  Problem # 2:  CARDIOMYOPATHY (ICD-425.4) LV function normal on most recent echocardiogram. He is complaining of fatigue. Check hemoglobin and TSH. Wean Coreg to off and substitute Cardizem. His updated medication list for this problem includes:    Aspirin 81 Mg Tbec (Aspirin) .[name]... Take one tablet by mouth daily    Cardizem Cd 240 Mg Xr24h-cap (Diltiazem hcl coated beads) ..... One tablet by mouth once daily    Warfarin Sodium 5 Mg Tabs (Warfarin sodium) ..... Use as directed by anticoagulation clinic  Problem # 3:  PACEMAKER, PERMANENT (ICD-V45.01) Management her EP.  Problem # 4:  THORACIC AORTIC ANEURYSM,HX OF (ICD-441.2)  Problem # 5:  COUMADIN THERAPY (ICD-V58.61) Managed per primary care.  Problem # 6:  HYPERTENSION (ICD-401.9) Wean Coreg to off and add Cardizem as described above. His updated medication list for this problem includes:    Aspirin 81 Mg Tbec (Aspirin) .[name]... Take one tablet by mouth daily    Cardizem Cd 240 Mg Xr24h-cap (Diltiazem hcl coated beads) ..... One tablet by mouth once daily  Problem # 7:  HYPOTHYROIDISM (ICD-244.9)  His updated medication list for this problem includes:    Synthroid 50 Mcg Tabs (Levothyroxine sodium) .[name]... Take 1 by mouth once daily  His updated medication list for this problem includes:    Synthroid 50 Mcg Tabs (Levothyroxine sodium) .[name]... Take 1 by mouth once daily  Other Orders: TLB-TSH (Thyroid Stimulating Hormone) (84443-TSH) TLB-CBC Platelet - w/Differential (85025-CBCD)  Patient Instructions: 1)  Your physician recommends that you schedule a follow-up appointment in: 4-6 WEEKS 2)  Your physician has recommended you make the following change in your medication:DECREASE CARVEDALOL 1/2 TABLET TWICE DAILY X 3 DAYS THEN DECREASE CARVEDALOL TO 1/4 TABLET  TWICE DAILY X 3 DAYS THEN 1/8 TABLET TWICE DAILY X 3 DAYS THEN STOP 3)  WHEN YOU START THE 1/4 TABLET TWICE DAILY START CARDIZEM CD 240MG  ONCE DAILY 4)  Your physician has requested that you have an echocardiogram.  Echocardiography is a painless test that uses sound waves to create images of your heart. It provides your doctor with information about the size and shape of your heart and how well your heart's chambers and valves are working.  This procedure takes approximately one hour. There are no restrictions for this procedure. Prescriptions: CARDIZEM CD 240 MG XR24H-CAP (DILTIAZEM HCL COATED BEADS) one tablet by mouth once daily  #30 x 12   Entered by:   Deliah Goody, RN   Authorized by:   Ferman Hamming, MD, [facility]   Signed by:   Deliah Goody, RN on 09/20/2010   Method used:   Faxed to .Marland KitchenMarland Kitchen  [facility] (retail)       [address]       [place], [place]  [address]       Ph: [phone] or [phone]       Fax: [phone]   RxID:   [id number] CARDIZEM CD 240 MG XR24H-CAP (DILTIAZEM HCL COATED BEADS) one tablet by mouth once daily  #30 x 12   Entered by:   Deliah Goody, RN   Authorized by:   Ferman Hamming, MD, [facility]   Signed by:   Deliah Goody, RN on 09/20/2010   Method used:   Print then Give to Patient   RxID:   [id number]

## 2011-01-27 NOTE — Cardiovascular Report (Signed)
Summary: Office Visit Remote   Office Visit Remote   Imported By: Roderic Ovens 01/13/2010 11:56:27  _____________________________________________________________________  External Attachment:    Type:   Image     Comment:   External Document

## 2011-02-06 ENCOUNTER — Encounter (INDEPENDENT_AMBULATORY_CARE_PROVIDER_SITE_OTHER): Payer: Self-pay | Admitting: *Deleted

## 2011-02-16 NOTE — Cardiovascular Report (Signed)
Summary: Office Visit Remote   Office Visit Remote   Imported By: Roderic Ovens 02/08/2011 14:54:47  _____________________________________________________________________  External Attachment:    Type:   Image     Comment:   External Document

## 2011-02-16 NOTE — Letter (Signed)
Summary: Remote Device Check  [employer], Main Office  [address]   [place], [place] [address]   Phone: [phone]  Fax: [phone]     February 06, 2011 MRN: [id number]   [facility] [address] [place], [place]  [address]   Dear Mr. Ventresca,   Your remote transmission was recieved and reviewed by your physician.  All diagnostics were within normal limits for you.  __X___Your next transmission is scheduled for: 04-21-2011.  Please transmit at any time this day.  If you have a wireless device your transmission will be sent automatically.   Sincerely,  Vella Kohler

## 2011-04-21 ENCOUNTER — Ambulatory Visit (INDEPENDENT_AMBULATORY_CARE_PROVIDER_SITE_OTHER): Payer: Medicare Other | Admitting: *Deleted

## 2011-04-21 DIAGNOSIS — Z950 Presence of cardiac pacemaker: Secondary | ICD-10-CM

## 2011-04-21 DIAGNOSIS — I495 Sick sinus syndrome: Secondary | ICD-10-CM

## 2011-05-01 NOTE — Progress Notes (Signed)
Pacer remote check  

## 2011-05-10 NOTE — Assessment & Plan Note (Signed)
[facility]                            CARDIOLOGY OFFICE NOTE   NAME:[name] [name]                       MRN:          [id number]  DATE:08/01/2008                            DOB:          25-Oct-1928    [name] [name] is a gentleman who has a history of Bentall procedure with a  St. Jude aortic valve replacement as well as pacemaker placement, this  was in 2006.  His most recent echocardiogram was performed on August 10, 2007.  At that time, he was found to have normal LV function.  There was  a St. Jude mechanical prosthesis with trivial aortic insufficiency.  It  was felt that the aortic arch was dilated at 5 cm.  The left atrium was  moderately dilated.  The right ventricle was also moderately dilated,  and there was mild pulmonic insufficiency.  There was a 4 x 6 cm  anechoic mass in the liver.  We subsequently scheduled the patient to  have a CTA of his aorta.  This showed mild ectasia of the ascending and  transverse aorta with no dissection.  There was scattered lung nodules  and a followup was recommended.  Note, limited abdominal imaging showed  left-sided hepatic cysts.  Since that time, he denies any dyspnea, chest  pain, palpitations, or syncope.  There is no pedal edema.   MEDICATIONS:  1. Aspirin 81 mg daily.  2. Jantoven.  3. Coumadin.  4. Coreg 25 mg p.o. b.i.d.  5. Synthroid 50 mcg p.o. daily.   PHYSICAL EXAMINATION:  GENERAL:  Shows a blood pressure of 136/85 and  his pulse is 75.  He weighs 187 pounds.  HEENT:  Normal.  NECK:  Supple.  CHEST:  Clear.  CARDIOVASCULAR:  Reveals a crisp mechanical valve sound.  I cannot  appreciate a diastolic murmur.  ABDOMEN:  Shows no tenderness.  EXTREMITIES:  Show no edema.   His electrocardiogram shows a sinus rhythm at a rate of 65.  There is  left axis deviation.  There is nonspecific T-wave changes.   DIAGNOSES:  1. History of Bentall procedure and aortic valve replacement - we  will      plan to repeat his echocardiogram to assess his aortic valve and      his aortic root.  Note, his recent CT did not show significant      dilatation of the aortic arch.  The proximal descending aorta      measured 3.4 cm.  He will continue with subacute bacterial      endocarditis prophylaxis.  2. Chronic Coumadin therapy - this is being managed by Dr. Purnell [name] with      goal INR of 2.5 to 3.  3. Hypothyroidism - he will continue on Synthroid.  4. Abnormal chest CT - we will plan to repeat his chest CT without      contrast and also scan through his liver given his history of      nodules.  5. History of benign prostatic hypertrophy.  6. Hypertension - his blood pressure is adequately controlled.  7. Dr. Purnell [name]  is following his laboratories.  We will see him back in 1      year.     Madolyn Frieze Jens Som, MD, [facility]  Electronically Signed    BSC/MedQ  DD: 08/01/2008  DT: 08/02/2008  Job #: [id number]   cc:   [name] [name], M.D.

## 2011-05-10 NOTE — Assessment & Plan Note (Signed)
[facility]                         ELECTROPHYSIOLOGY OFFICE NOTE   JAYLENN, ALTIER                       MRN:          [id number]  DATE:10/02/2007                            DOB:          11-03-1928    Mr. Gildner returns today for followup.  He is a very pleasant elderly  man with a history of symptomatic bradycardia and atrial fibrillation,  who has been suppressed very nicely in the last several months and has  maintained sinus rhythm.  He returns today for followup.  He denies  chest pain.  He denies shortness of breath.  He had no specific  complaints today.   MEDICATIONS:  1. Aspirin 81 mg daily.  2. Synthroid 50 mcg daily.  3. Jantoven as directed.  4. Coreg 25 twice daily.  5. He was previously on ACE inhibitor, but this was discontinued      secondary to cough.   EXAM:  .  He is a pleasant, elderly man in no distress.  Blood pressure was 140/90, the pulse 81 and regular.  Respirations were  16, and the weight was 190 pounds.  NECK:  No jugular venous distention.  LUNGS:  Clear bilaterally to auscultation.  No wheezes, rales, or  rhonchi are present.  CARDIOVASCULAR:  Regular rate and rhythm with normal S1 and S2.  EXTREMITIES:  No edema.   Interrogation of his pacemaker demonstrates a Medtronic EnRhythm with P  and R waves of 3 and 8, respectively.  The impedance 552 on the atrium  and 624 on the ventricle with a threshold volt of 0.4 in both atrium and  ventricle.  The battery voltage was 2.99 volts.  There were 14 mode  switches over 0% of the total rhythm.   IMPRESSION:  1. Symptomatic bradycardia.  2. Paroxysmal atrial fibrillation.  3. Status post pacemaker insertion.   DISCUSSION:  Overall, Mr. Richens is stable.  He has had no significant  atrial fibrillation in the last year.  We will plan to see him back in  the office for pacemaker followup in a year.     Doylene Canning. Ladona Ridgel, MD  Electronically Signed    GWT/MedQ  DD: 10/02/2007  DT: 10/02/2007  Job #: [id number]

## 2011-05-10 NOTE — Assessment & Plan Note (Signed)
[facility]                            CARDIOLOGY OFFICE NOTE   NAME:[name], [name]                       MRN:          [id number]  DATE:08/03/2007                            DOB:          [birth year]    [name] [name] is a very pleasant gentleman who has a history of a Bentall  procedure with St. Jude aortic valve replacement, as well as pacemaker  placement.  This procedure was performed in January of 2006.  Since I  last saw him, the patient is doing well.  He denies any dyspnea, chest  pain, palpitations, or syncope.  There is no pedal edema.   MEDICATIONS:  1. Aspirin 81 mg p.o. daily.  2. Synthroid 50 mcg p.o. daily.  3. Jantoven.  4. Coreg 25 mg p.o. b.i.d.   PHYSICAL EXAM:  Blood pressure 110/68, pulse 68.  His HEENT is normal.  NECK:  Supple with no bruits.  CHEST:  Clear.  CARDIOVASCULAR:  Regular rate and rhythm.  There is a crisp mechanical  valve sound, and I cannot appreciate a diastolic murmur.  ABDOMEN:  Shows no pulsatile masses.  No bruits.  EXTREMITIES:  No edema.   ELECTROCARDIOGRAM:  Sinus rhythm at a rate of 68.  There is left axis  deviation and an incomplete right bundle branch block is noted.  There  are nonspecific ST changes.   DIAGNOSES:  1. History of Bentall procedure and aortic valve replacement - we will      plan to repeat his echocardiogram to assess his aortic valve and      his aortic root.  He will most likely need a CT or an MRI next year      when I see him back.  2. Chronic Coumadin therapy - this is being followed by Dr. Purnell Shoemaker.      Our goal INR should be between 2.5 to 3.  3. Hypothyroidism - he will continue on his Synthroid.  4. History of benign prostatic hypertrophy.  5. Hypertension - his blood pressure is well-controlled on his present      medications.   He will continue with SBE prophylaxis and we will see him back in 1  year.    Madolyn Frieze Jens Som, MD, [facility]  Electronically Signed   BSC/MedQ  DD: 08/03/2007  DT: 08/03/2007  Job #: [id number]   cc:   Lianne Bushy, M.D.

## 2011-05-13 NOTE — Discharge Summary (Signed)
NAMEIZIAH, CATES NO.:  [id number]   MEDICAL RECORD NO.:  [id number]          PATIENT TYPE:  INP   LOCATION:  2016                         FACILITY:  [facility]   PHYSICIAN:  Salvatore Decent. Cornelius Moras, M.D. DATE OF BIRTH:  16-Aug-1928   DATE OF ADMISSION:  01/18/2005  DATE OF DISCHARGE:  01/27/2005                                 DISCHARGE SUMMARY   REFERRING PHYSICIAN:  Charlies Constable, M.D.   HISTORY OF PRESENT ILLNESS:  Patient is a very pleasant 77 year old  gentleman who was recently evaluated for a cardiac murmur.  It was felt to  be consistent with aortic insufficiency.  He was scheduled for  echocardiogram  with adenosine Myoview.  Left ventricular function was felt  to be mildly reduced in the 45 to 50% range with moderate left ventricular  enlargement.  His aortic root was severely dilated measuring 6.9 cm.  There  was moderate aortic insufficiency.  There was mild mitral regurgitation.  He  was felt to be a candidate for further evaluation and treatment and  scheduled for cardiac catheterization  and further delineation of his  surgical care would be based on these findings.   MEDICATIONS ON ADMISSION:  1.  Hytrin 5 mg daily.  2.  Aspirin 81 mg daily.  3.  Synthroid 0.05 mg daily.  4.  Relafen.   PAST MEDICAL HISTORY:  1.  Thoracic aortic aneurysm.  2.  Moderate aortic insufficiency.  3.  Hypertension.  4.  Hypothyroidism.   For family history, social history, review of systems and physical  examination please see history and physical done at the time of admission.   HOSPITAL COURSE:  Patient was admitted on January 18, 2005, taken to the  cardiac catheterization lab where he was found to have a large aneurysm of  the aortic root with 3+ aortic insufficiency.  The coronary vessels were  quite unremarkable.  He was seen in consultation by Salvatore Decent. Cornelius Moras, M.D.,  who evaluated the patient and studies and recommended to proceed with  resection and grafting  of the thoracic aneurysm with aortic valve  replacement including Bentall procedure.  Patient was scheduled and on  January 19, 2005, he was taken to the operating room and underwent the  procedure.  Patient tolerated the procedure well, requiring no blood  products.  He came off cardiopulmonary bypass with AV pacing and no  inotropic agent.   POSTOPERATIVE HOSPITAL COURSE:  Patient has done quite well.  He has  remained hemodynamically stable but did have some issues of bradycardia.  Some thought of possible need for permanent pacing was undertaken but this  was subsequently determined not to be required.  He additionally had  postoperative atrial fibrillation which did revert to a normal sinus rhythm.  Additionally, the patient did have some minor difficulties with  postoperative urinary retention but these issues resolved over time as well.  He was seen by Dr. Vonita Moss and recommended just to continue on his Hytrin.  His incision showed good healing without evidence of infection.  He  tolerated routine advancement and activity commensurate for level of  postoperative convalescence.  His INR was managed and dosage was adjusted  daily.  He was weaned from oxygen and maintained good saturations on room  air.  He remained afebrile.  His examination was felt to be quite stable and  he was discharged on January 27, 2005.   DISCHARGE MEDICATIONS:  1.  Aspirin 81 mg daily.  2.  Coumadin 5 mg daily.  3.  Synthroid 50 mcg daily.  4.  Hytrin 10 mg q.h.s.  5.  For pain, Tylox one or two q.4-6h. p.r.n. as needed.   DISCHARGE INSTRUCTIONS:  Patient received written instructions in regard to  medications, activity, diet, wound care and follow-up.  Follow-up included  INR follow-up at Dr. Harlene Salts office, cardiology follow-up at Dr. Ludwig Clarks,  Dr. Cornelius Moras to see the patient February 21, 2005.  He is also to follow up with  Dr. Vernie Ammons, his regular urologist, Monday, January 31, 2005.  Dr. Purnell Shoemaker  was  to follow the Coumadin.   FINAL DIAGNOSIS:  Thoracic aortic aneurysm, now status post resection and  grafting with replacement and Bentall procedure.   OTHER DIAGNOSES:  1.  Postoperative urinary retention secondary to benign prostatic      hyperplasia.  2.  Postoperative bradyarrhythmias.  3.  Postoperative atrial fibrillation.  4.  Other diagnoses as previous listed per the dictation.      WEG/MEDQ  D:  04/27/2005  T:  04/27/2005  Job:  [id number]   cc:   Charlies Constable, M.D. [facility]   Maisie Fus D. Riley Kill, M.D. [facility]

## 2011-05-13 NOTE — Consult Note (Signed)
NAMEMADHAV, [name] [name] NO.:  [id number]   MEDICAL RECORD NO.:  [id number]          PATIENT TYPE:  INP   LOCATION:  2016                         FACILITY:  [facility]   PHYSICIAN:  Maretta Bees. Vonita Moss, M.D.DATE OF BIRTH:  [birth year]   DATE OF CONSULTATION:  01/26/2005  DATE OF DISCHARGE:                                   CONSULTATION   REASON FOR CONSULTATION:  I was asked to see this 77 year old white male for  evaluation of postoperative problems voiding with increased residual urine.  He is an old patient of Dr. Loraine Leriche C. Ottelin but has not seen him since 2002.  Dr. Veverly Fells. Ottelin saw him back then for PSA elevation and he had a  prostate biopsy in 2001 because of PSA that got up to 13.5 with a biopsy  that was benign. The PSA of 5.28 was noted in November 2002 which is the  last time Dr. Loraine Leriche C. Ottelin saw him. Apparently, an outside PSA of 5.32 in  June of 2003 was done on Dr. Loreli Dollar office. This gentleman has not  returned for follow up since.   He has been on 10 mg of Hytrin for years because of bladder outlet  obstructive symptoms and says he was voiding fairly well. He underwent major  cardiac surgery, and after his Foley catheter was removed, he was found to  have an increased residual urine and it was also noted yesterday that he had  been off his Hytrin. It was restarted at 10 mg dosage q.h.s. Residual urine  today was 571 cc. He feels like he is voiding better than that would imply.  He is having no bladder pain. He is having no dysuria or hematuria.   His recent surgery was for aortic insufficiency and ascending thoracic  aortic aneurysm and those were repaired on January 19, 2005.   PAST MEDICAL HISTORY:  1.  Some congestive heart failure.  2.  He also has hypertension.  3.  Hypothyroidism.   PAST SURGICAL HISTORY:  1.  He has had right total knee replacement.  2.  Three inguinal hernia repairs.  3.  Carpal tunnel procedure.   MEDICATIONS:  1.  Hytrin.  2.  Aspirin 81 mg.  3.  Synthroid.  4.  Relafen p.r.n.   SOCIAL HISTORY:  He does not smoke and drinks little alcohol.   PHYSICAL EXAMINATION:  GENERAL:  He is alert and oriented.  In no acute  distress.  SKIN:  Warm and dry.  ABDOMEN:  I cannot feel a distended bladder.  No inguinal nodes.  GENITOURINARY:  Penis, urethra, meatus, testes, and epididymes are without  any significant lesions.  Perineum is normal.  RECTAL:  Sphincter is normal.  Prostate feels benign and smooth.  No seminal  vesicle tissue palpated.   IMPRESSION:  Benign prostatic hypertrophy and prostatism with increased  residual urine and a past history of PSA elevations.   PLAN:  Continue on 10 mg of Hytrin at bed time and check PVR every shift  with in and out catheterization.  We will have Dr. Loraine Leriche C. Vernie Ammons  check on  him in follow-up to try and resolve these current voiding problems.      LJP/MEDQ  D:  01/26/2005  T:  01/27/2005  Job:  [id number]   cc:   Salvatore Decent. Cornelius Moras, M.D.  [address]  [place]  [place] [address]   Lianne Bushy, M.D.  [address]  [place]  [place] [address]  Fax: [phone]   Olga Millers, M.D. [facility]

## 2011-05-13 NOTE — Consult Note (Signed)
NAMETRIGG, DELAROCHA NO.:  [id number]   MEDICAL RECORD NO.:  [id number]          [name] TYPE:  INP   LOCATION:  3743                         FACILITY:  [facility]   PHYSICIAN:  Doylene Canning. Ladona Ridgel, M.D.  DATE OF BIRTH:  20-Feb-1928   DATE OF CONSULTATION:  02/11/2005  DATE OF DISCHARGE:                                   CONSULTATION   INDICATIONS FOR CONSULTATION:  Evaluation of symptomatic tachybrady  syndrome.   HISTORY OF PRESENT ILLNESS:  [name] [name] is a very pleasant 77 year old man,  with a history of aortic insufficiency and aortic aneurysm; status post  Bentall procedure with mechanical aortic valve placed and aortic root  reconstruction several weeks ago.  [name] [name]'s postoperative course was  complicated by tachybrady syndrome, but eventually improved by [name] time of  discharge.  There was consideration as to whether [name] [name] might need a  pacemaker, but it was thought with resolution of his atrial arrhythmias that  he would be better.  He initially did well after surgery and after  discharge.  However, over [name] last several days he has had symptomatic  fatigue and weakness.  He has found that his heart rate is either very  decreased or very increased.  It has been documented in [name] high 30s and low  40s, and also up in [name] 140 range.  He was seen by his primary physician,  Dr. Lianne Bushy, and admitted for additional evaluation.  [name] [name]  denies syncope.  He denies chest pain.  He does have some dyspnea and  fatigue.   PAST MEDICAL HISTORY:  As previously noted.  There is also a history of  hypertension.   SOCIAL HISTORY:  [name] [name] is retired.  He worked as a Engineer, maintenance for  many years.  He denies tobacco or ethanol use.   FAMILY HISTORY:  Notable for mother dying at age 29 of heart failure, and  father dying of stroke at age 76.   REVIEW OF SYSTEMS:  Notable for a 15 pound weight loss since his surgery.  He denies fevers or chills.  He  denies headache, vision or hearing problems.  He denies skin rashes or lesions.  He denies cough or hemoptysis.  He does  have dizziness and palpitations, with associated presyncope.  He denies  dysuria, hematuria; but does have occasional nocturia.  He does have  generalized weakness associated with bradycardia.  He denies nausea,  vomiting, diarrhea or constipation.  [name] rest of his review of systems were  checked and negative.   PHYSICAL EXAMINATION:  GENERAL:  He is a pleasant 76 year old man in no  distress.  VITAL SIGNS:  Blood pressure 138/82, pulse 140 and regular, respirations 18.  HEENT:  Normocephalic and atraumatic.  [name] pupils are equal and round.  Oropharynx was moist.  Sclerae anicteric.  NECK:  Reveals no jugular venous distention.  There was no thyromegaly.  [name]  trachea was midline.  CARDIOVASCULAR:  Revealed regular tachycardia.  CHEST:  Revealed a well-healed median sternotomy incision.  LUNGS:  Revealed rales in [name] bases bilaterally.  ABDOMEN:  Soft and nontender, nondistended.  There was no organomegaly.  EXTREMITIES:  Demonstrated no clubbing, cyanosis or edema.  NEUROLOGIC:  Revealed normal neurologic function, with no focal findings.  His cranial nerves II-XII were grossly intact, and his strength was 5/5 and  symmetric.   EKG:  Demonstrates atrial flutter with a rapid ventricular response, as well  as sinus bradycardia.   IMPRESSION:  1.  Symptomatic tachybrady syndrome.  2.  Status post aortic valve replacement, with Bentall aortic      reconstruction.  3.  History of aortic insufficiency and thoracic aortic aneurysm, status      post surgery.  4.  Elevated dysfunction, with an EF of 35-40% by most recent      echocardiogram.   DISCUSSION:  I have discussed [name] treatment options with [name] [name].  He  has symptomatic tachybrady syndrome.  Permanent pacemaker insertion is  warranted.  [name] risks, benefits, goals and expectations of [name] procedure  have  been discussed with [name] [name], and he would like to proceed with  this.  Will plan to schedule this as soon as his INR becomes less than 2.5.      GWT/MEDQ  D:  02/11/2005  T:  02/13/2005  Job:  [id number]

## 2011-05-13 NOTE — Discharge Summary (Signed)
[name] [name], [name] [name] NO.:  [id number]   MEDICAL RECORD NO.:  [id number]          PATIENT TYPE:  INP   LOCATION:  3743                         FACILITY:  [facility]   PHYSICIAN:  [name] [name], M.D. LHCDATE OF BIRTH:  1928/11/23   DATE OF ADMISSION:  02/10/2005  DATE OF DISCHARGE:                           DISCHARGE SUMMARY - REFERRING   SUMMARY OF HISTORY:  [name] [name] is a 77 year old gentleman who recently  underwent a Bentall procedure with a replacement of his ascending aortic  root and a placement in that root of a mechanical aortic prosthesis  performed by Dr. Cornelius [name].  He has done very well but postoperatively, he did  have some bradycardia and PAF.  He has been evaluated by Dr. Ladona [name] and at  that time did not feel that he needed a pacemaker.  However, since  discharge, the patient has felt fatigued.  He saw Dr. Purnell [name] on the day of  admission in the office and his heart ranged from 40 up to 125  intermittently.  Thus, he was admitted for further evaluation.   His history is also notable for hypothyroidism and Coumadin therapy with his  valve replacement.   LABORATORY DATA:  Chest x-ray on 02/10/05 showed status post valve  replacement, cardiomegaly, left basilar atelectasis without edema.  On  02/15/05, chest x-ray showed left pacer without pneumothorax, decreasing left  lobe atelectasis, and small effusions.  EKG on admission showed sinus  bradycardia with some PACs and decreased T-wave inversion in the precordial  leads.  Subsequent EKGs showed sinus tachycardia, atrial flutter, and atrial  fibrillation.  Echocardiogram performed on 02/11/05 was compared to 01/24/05.  There was no significant change.  LV function was difficult to grade  secondary to atrial fibrillation and septal motion abnormalities.  His EF  was 35-40% paradoxical septal measurements, severe hypokinesis, and akinesis  of the anterolateral and anteroseptal walls, moderate LVH, prosthetic  aortic  valve, trivial AI, mild aortic root dilatation, mild MR, left atrial  dilatation, right ventricle dilatation, with moderately reduced RV function,  moderate right atrial enlargement.   LABORATORY DATA:  Admission hemoglobin 11.1, hematocrit 32.2, normal  indices, platelets 299,000, WBC 7.0.  Hematology at the time of discharge  showed a hemoglobin of 9.9, hematocrit 28.9, normal indices, platelets  242,000, WBC 6.6.  Admission PT was 27, with an INR of 3.6, and a PTT of 48.  INR on 02/12/05 was 3.8, on 02/13/05 was 3.5, on 02/13/05 was 3.2, on 02/14/05  was 2.4, on 02/15/05 was 1.8, on 02/16/05 was 1.8, on 02/17/05 was 2.1.  Admission sodium was 137, potassium 4.2, BUN 27, creatinine 1.7, glucose 91,  normal LFTs.  On 02/17/05, prior to discharge sodium was 132, potassium 3.8,  BUN 32, creatinine 1.9.  Admission CK-MB and troponin were negative for  myocardial infarction.  TSH was 2.499.   HOSPITAL COURSE:  Dr. Eden [name] saw the patient.  Given his brady-tachy, felt  that he should undergo pacer insertion and begun on amiodarone.  EP was  asked to evaluate as well.  Dr. Myrtis [name] saw the patient on 02/10/05.  Dr. Myrtis [name]  notes in his notes that the patient's recent Hytrin for his BPH was  discontinued by Dr. Vernie [name] secondary to hypotension.  The patient has also  been tried on Flomax, however, the patient has continued this medication.  Dr. Myrtis [name] reviewed the echocardiogram of 01/24/05 and felt that this should be  repeated given the decreased EF and wall motion abnormalities.  This was  performed as previously described.  EP consult was also obtained with Dr.  Mosetta [name] and Dr. Ladona [name].  Dr. Ladona [name] felt that he should have a  pacemaker.  Coumadin was placed on hold.  Over the weekend, INR increased.  Dr. Eden [name] on 02/13/05 gave the patient some vitamin K.  On 02/14/05, Dr.  Ladona [name] inserted a Medtronic EnRhythm pacemaker via the left subclavian vein  without difficulty.  Nursing noted the patient  continued to have episodes of  tachycardia, which was felt to be atrial flutter.  The patient used IV  Lopressor p.r.n. for increased heart rate while undergoing amiodarone  loading.  Heparin was restarted with the anticipation of resuming Coumadin.  Case management became involved to assist with any discharge needs.  On  02/15/05, the patient was evaluated with Dr. Mosetta [name].  Dr. Jens [name]  noted that the patient had a low grade fever, however, the patient did not  have any evidence of infection when UA was checked.  Due to his elevated BUN  and creatinine, his lisinopril was held and Coreg was titrated up.  In the  next couple of days, the patient continued to have some intermittent atrial  flutter.  On 02/16/05, Dr. Jens [name] noted a small pocket hematoma at the  pacemaker site.  However, the patient was at this time afebrile.  Dr.  Antoine [name] [name] noted that he will reattempt ACE inhibitor as an outpatient as  renal function improves.  Dr. Graciela [name] noted the pocket hematoma and  discontinued his heparin.  Nursing noted that the patient did miss one dose  of amiodarone on 02/17/05.  Dr. Jens [name] re-assessed the patient on 02/17/05  and felt that the patient could be discharged home.  He thought the pocket  hematoma had improved.  Just prior to discharge, the patient did have some  slight dizziness.  With ambulation, blood pressure was 90/54.  However,  after review with Dr. Jens [name] and further ambulation, Dr. Jens [name] gave  permission for the patient to be discharged home.   DISCHARGE DIAGNOSES:  1.  Brady-tachy with intermittent atrial fibrillation and flutter, status      post Medtronic pacemaker as previously described.  2. Hypotension.  3.      Renal insufficiency, lisinopril discontinued.  4. Decreased ejection      fraction with wall motion abnormalities.   DISPOSITION:  The patient is discharged home.  His new medications include: 1. Coreg 6.25 mg b.i.d.  2. Amiodarone 400 mg daily.  3.  Digoxin 0.125 mg  daily.  4. He was asked to continue aspirin 81 mg daily.  5. His Coumadin  dose was decreased to 5 mg daily.  6. He was asked to resume his Synthroid  0.05 mg daily.  7. Tylenol for discomfort.   Mobility sheet was filled out by Nadine Counts, maintain low salt/cholesterol diet.  He was asked to keep his incision dry for the next seven days and sponge  bathe until Monday, 02/21/05.  He will follow up in the pacer clinic on  Thursday, 02/24/05 at 11:30 a.m.  At that time, a BNP and a PT/INR will be  checked.  He will also follow up with Dr. Cornelius [name] on 02/21/05 at 12:45 p.m.      EW/MEDQ  D:  02/17/2005  T:  02/17/2005  Job:  [id number]   cc:   [name] [name], M.D. [facility]   Lianne Bushy, M.D.  [address]  [place]  [place] [address]  Fax: [phone]

## 2011-05-13 NOTE — Assessment & Plan Note (Signed)
[facility]                           ELECTROPHYSIOLOGY OFFICE NOTE   ASIAH, BEFORT                       MRN:          [id number]  DATE:10/11/2006                            DOB:          Jun 01, 1928    [name] [name] returns today for followup.  He is a very pleasant elderly male  with a history of symptomatic bradycardia and complete heart block after  aortic valve replacement.  The patient developed postoperative bradycardia  and subsequently had a pacemaker placed back in February 2006.  He returns  today for followup.  He is overall doing well.  He notes some arthritic  complaints in his left and right shoulder.  He also notes that he will  intermittently have loss of vision in his right eye which might last for a  minute or two and this has been present for several years.  It was initial  more severe when he was on ACE inhibitors and these were discontinued, and  his spells decreased but still present and last 1-2 minutes and typically  occur once every 2 or 3 months.  Multiple evaluations have been unrevealing.  He denies palpitations.   EXAMINATION:  GENERAL:  He is a pleasant, well-appearing elderly man in no  acute distress.  VITAL SIGNS:  The blood pressure today was 128/66, the pulse 64 and regular,  respirations 18, the weight was 186 pounds.  NECK:  Revealed no jugular venous distention.  LUNGS:  Clear bilaterally to auscultation.  CARDIOVASCULAR:  Revealed a regular rate and rhythm with normal S1 and S2.  He does have a mechanical aortic closure sound.  EXTREMITIES:  Demonstrated no edema.   IMPRESSION:  1. Symptomatic bradycardia following aortic valve replacement.  2. Chronic Coumadin therapy.  3. Paroxysmal atrial fibrillation.   DISCUSSION:  Overall, [name] [name] is stable.  His pacemaker today was  interrogated, demonstrating P and R waves of 2 and 6 respectively with an  impedance of 568 and 640 in the atrium and the  ventricle, and thresholds of  0.5 at 0.4 in both atrium and ventricle.  He had very few mode switching  episodes.   We will plan to see the patient back in the office in 1 year for pacemaker  check.  I have asked that if his symptoms of transient right loss of vision  persist that he should go to the emergency room.  He will continue on  Coumadin.            ______________________________  Doylene Canning. Ladona Ridgel, MD   GWT/MedQ DD:  10/11/2006 DT:  10/12/2006 Job #:  [id number]

## 2011-05-13 NOTE — Assessment & Plan Note (Signed)
[facility]                              CARDIOLOGY OFFICE NOTE   NAME:[name] [name], [name] [name]                       MRN:          [id number]  DATE:08/21/2006                            DOB:          [birth year]   Mr. Venard is a very pleasant gentleman who has a history of Bentall  procedure with a mechanical St. Jude aortic valve, as well as pacemaker  placement.  Since I last saw him, he denies any dyspnea, chest pain,  palpitations, pedal edema or syncope.  He has had no recurrent visual  disturbances.   MEDICATIONS:  Include:  1. Aspirin 81 mg p.o. daily.  2. Synthroid 50 mcg p.o. daily.  3. Coumadin as directed.  4. Lanoxin 0.125 mg p.o. q. day.  5. Coreg 12.5 mg p.o. b.i.d.   PHYSICAL EXAMINATION:  VITAL SIGNS:  Today, shows a blood pressure 121/70.  His pulse is 65.  NECK:  Supple without no bruits.  CHEST:  Clear.  CARDIOVASCULAR:  Reveals a regular rate and rhythm.  He has a crisp  mechanical valve with a 1/6 systolic murmur, but no diastolic murmur is  noted.  EXTREMITIES:  Showed no edema.   His electrocardiogram today shows a sinus rhythm with anterolateral T-wave  inversion.  With magnet, this shows appropriate ventricular pacing.   DIAGNOSES:  1. Status post Bentall with mechanical St. Jude aortic valve and aortic      root replacement.  2. Coumadin therapy.  3. Hypothyroidism.  4. History of benign prostatic hypertrophy.  5. Hypertension.   PLAN:  Mr. Bradly is doing extremely well from a symptomatic standpoint.  We will plan to repeat his echocardiogram to reset his aortic valve.  His LV  function by most recent echocardiogram was back to normal, and we will  therefore discontinue his Digoxin.  He will continue on Coumadin and  aspirin, and his Coumadin is being followed by Dr. Purnell Shoemaker.  Dr.  Purnell Shoemaker is also following his other lab reports.  He will continue with his  SBE prophylaxis, and I will see him back in 1 year.                          Madolyn Frieze Jens Som, MD, [facility]  BSC/MedQ  DD:  08/21/2006 DT:  08/22/2006 Job #:  [id number]   cc:   Lianne Bushy, MD

## 2011-05-13 NOTE — Op Note (Signed)
NAMESHEEHAN, STACEY NO.:  [id number]   MEDICAL RECORD NO.:  [id number]          PATIENT TYPE:  INP   LOCATION:  3743                         FACILITY:  [facility]   PHYSICIAN:  Doylene Canning. Ladona Ridgel, M.D.  DATE OF BIRTH:  [birth year]   DATE OF PROCEDURE:  02/14/2005  DATE OF DISCHARGE:                                 OPERATIVE REPORT   PROCEDURE PERFORMED:  Implantation of dual-chamber pacemaker.   INDICATIONS:  Symptomatic tachy-brady syndrome.   INTRODUCTION:  The patient is a 76-year man who is status post aortic valve  replacement and aortic root replacement with a Bentall procedure several  weeks ago. He had tachy-brady syndrome. He was doing well for approximately  two weeks and then developed recurrent severe fatigue, presented to Dr.  Harlene Salts office where he was found to have heart rates in the 40s up to 125-  130 range. He was readmitted to the hospital. He is now referred for  permanent pacemaker insertion.   PROCEDURE PERFORMED:  After informed consent was obtained, the patient was  taken to the diagnostic EP lab in fasting state.  After the usual  preparation, draping, intravenous fentanyl and Midazolam were given for  sedation. 30 cc of lidocaine was infiltrated in the left infraclavicular  region. 10 cc of contrast was injected into the left upper extremity venous  system and electrocautery utilized to dissect down to the fascial plane. The  left subclavian vein was punctured x 2 and the Medtronic model 5076 58-cm  active fixation pacing lead serial number [id number] was advanced into the  right ventricle and a Medtronic model 5076 52-cm fixation pacing lead serial  number [id number] was advanced to the right atrium.  Mapping was carried in  the right ventricle the final site of the RV apex R-waves measured 9 mV the  pacing threshold 0.3 volts at 0.5 milliseconds with the lead actively fixed.  10 V pacing did not stimulate the diaphragm. At this point  attention was  then turned placement of the atrial lead.  It was placed in the right atrium  where flutter/fibrillation waves measured 1 mV and the atrial pacing  impedance was 475 ohms with lead actively fixed.  10 V pacing in the atrium  again did not dilate the diaphragm. With both atrial and ventricular leads  in satisfactory position, they were secured to the subpectoralis fascia with  a figure-of-eight silk suture. In addition the sewing sleeve was secured  with silk suture. Kanamycin irrigation was utilized to irrigate the pocket.  Electrocautery utilized to assure hemostasis. The Medtronic EnRhythm model  [id number], serial number [id number] was connected to the atrial and  ventricular pacing leads and placed in the subcutaneous pocket.  Generator  was secured with silk suture. Additional kanamycin was utilized to irrigate  the pocket. The incision closed with layer of 2-0 Vicryl followed by 3-0  Vicryl followed by layer of 4-0 Vicryl.  Benzoin was painted on the skin,  Steri-Strips were applied and a pressure dressing placed. The patient was  returned to his room in satisfactory condition.   COMPLICATIONS:  There were  no immediate procedure complications.   RESULTS:  This demonstrates successful implantation of a Medtronic dual-  chamber pacing system in a patient with symptomatic tachy-brady syndrome.      GWT/MEDQ  D:  02/14/2005  T:  02/14/2005  Job:  [id number]   cc:   Lianne Bushy, M.D.  [address]  [place]  [place] [address]  Fax: [phone]   Olga Millers, M.D. [facility]

## 2011-05-13 NOTE — Consult Note (Signed)
NAMELIEF, PALMATIER NO.:  [id number]   MEDICAL RECORD NO.:  [id number]          PATIENT TYPE:  OIB   LOCATION:  2899                         FACILITY:  [facility]   PHYSICIAN:  Salvatore Decent. Cornelius Moras, M.D. DATE OF BIRTH:  [birth year]   DATE OF CONSULTATION:  DATE OF DISCHARGE:                                   CONSULTATION   REQUESTING PHYSICIAN:  Dr. Olga Millers.   PRIMARY CARE PHYSICIAN:  Dr. Lianne Bushy.   REASON FOR CONSULTATION:  Aortic insufficiency and ascending thoracic aortic  aneurysm.   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  The patient has been remarkably health for  all of his life.  He was recently noted to have a murmur on physical  examination.  This prompted referral to Dr. Olga Millers for elective  consultation.  The patient describes a six month history of mild,  progressive exertional shortness of breath.  He has otherwise been  asymptomatic.  He denies any history of resting shortness of breath, chest  pain, PND, orthopnea, lower-extremity edema, palpitations or syncope.  He  was initially evaluated by Dr. Olga Millers in the office on January 04, 2005.  Baseline electrocardiogram demonstrates normal sinus rhythm with left  ventricular hypertrophy and occasional premature atrial contractions.  He  subsequently underwent a 2-D echocardiogram on January 19.  This  demonstrated severely-dilated aortic root and ascending thoracic aorta with  at least moderate aortic insufficiency, mild mitral regurgitation, mild  tricuspid regurgitation, and mild global left ventricular dysfunction  (baseline ejection fraction estimated at 45-50).  Mr. Villella subsequently  underwent a CT angiogram which confirmed the presence of a large  aneurysm  involving the entire ascending thoracic aorta.  The proximal ascending  thoracic aorta measures nearly 7 cm in diameter, and it tapers down to 4 cm  at the level of the innominate artery.  The aorta becomes normal in the  descending thoracic aorta.  There is no sign of aortic dissection or other  significant abnormalities.  Mr. Colson was subsequently brought in for  elective left and right heart catheterization today.  This was performed by  Dr. Charlies Constable.  Findings at the time of catheterization confirmed the  presence of aneurysmal involvement of the ascending thoracic aorta with at  least moderate aortic insufficiency.  There were luminal irregularities in  the coronary arteries, but no flow-limiting coronary artery disease  appreciated.  There was mild-to-moderate pulmonary hypertension with normal  cardiac output.  Cardiac surgical consultation has been requested.   REVIEW OF SYSTEMS:  GENERAL:  The patient reports feeling well otherwise.  He has a good appetite.  He has not been gaining or losing weight.  CARDIAC:  As noted previously, notable for the absence of symptoms of resting  shortness of breath or chest pain.  RESPIRATORY:  Notable only for  intermittent dry cough.  The patient denies productive cough,  hemoptysis and  wheezing.  GASTROINTESTINAL:  Negative.  The patient reports having had an  episode of hematochezia in the distant past, but none for quite some time.  He apparently had a colonoscopy at that time.  He has no difficulty  swallowing.  He does not have problems with reflux or frequent indigestion.  He denies problems with constipation or diarrhea.  GU:  Negative.  HEMATOLOGIC:  Negative.  NEUROLOGIC:  Negative.  HEENT:  Negative.  The  patient sees his dentist regularly, and has no loose teeth or painful teeth.  He has partial lower dentures.  He has no recent changes in his eye sight.  PSYCHIATRIC:  Negative.   PAST MEDICAL HISTORY:  1.   Hypertension.  2.  Hypothyroidism.   PAST SURGICAL HISTORY:  1.  Right total knee replacement.  2.  Inguinal hernia repair x3.  3.  Right carpal tunnel release.   FAMILY HISTORY:  Notable for the absence of premature coronary artery  disease.  The patient's brother had bypass surgery, but remains alive at age  75.   SOCIAL HISTORY:  The patient is married, and lives [place] his [place]  He is a retired Visual merchandiser, but remains quite active  physically.  He is a nonsmoker, and denies any significant alcohol  consumption.   CURRENT MEDICATIONS:  1.  Hytrin 5 mg daily.  2.  Aspirin 81 mg daily.  3.  Synthroid 0.05 mg daily.  4.  Relafen as needed for arthritis pain.   PHYSICAL EXAMINATION:  GENERAL:  The patient is a well-appearing white male,  who appears stated age, or perhaps even younger, in no acute distress.  VITAL SIGNS:  Systolic blood pressure has been averaging 150 to 160 as he  has been here in the catheterization laboratory today.  He does not have  diastolic hypertension.  He is afebrile.  He is normal sinus rhythm.  HEENT:  Exam is grossly unrevealing.  He has good dentition.  NECK:  Supple.  There is no cervical or supraclavicular lymphadenopathy.  There is no jugular venous distension.  No carotid bruits are noted.  CHEST:  Auscultation of the chest includes clear and symmetrical breath  sounds bilaterally.  No wheezes or rhonchi are noted.  CARDIOVASCULAR:  Exam includes a regular rate and rhythm.  There is a grade  2-3/6 diastolic murmur heard best along the left sternal border.  No  systolic murmurs are appreciated.  ABDOMEN:  Soft, nontender.  The liver edge is not palpable.  Bowel sounds  are present.  There are no palpable masses.  EXTREMITIES:  Warm and well perfused.  There is no lower-extremity edema.  Pulses are strong and symmetrical in all four extremities.  There is no  venous insufficiency. RECTAL/GU:  Exams are both deferred.   NEUROLOGIC:  Examination is grossly nonfocal and symmetrical throughout.   STUDIES:  1.  CT angiogram performed January 20, is reviewed.  Notable for aneurysmal      dilatation of the ascending thoracic aorta with findings as described      previously.  No other significant abnormalities are identified.  2.  Cardiac catheterization performed today by Dr. Juanda Chance is reviewed.  This      demonstrates right dominant coronary circulation.  There is some      atherosclerotic plaque in the proximal and mid left anterior descending      coronary artery with perhaps at most 30% stenosis.  There are no flow-  limiting lesions.  There is short segment plaque with 20-30% stenosis in      the mid right coronary artery.  No other significant atherosclerotic      lesions are noted.  Pulmonary artery pressures are mild-to-moderately      elevated and measure 45/19 with a pulmonary capillary wedge pressure of      41.  Baseline cardiac output is 4.5 liters per minute.   IMPRESSION:  Ascending thoracic aortic aneurysm with at least moderate  aortic insufficiency and progressive symptoms of exertional shortness of  breath.  Mr. Molner aneurysm is quite large, and I believe that without  surgical intervention, he would remain at considerable risk for acute aortic  dissection and/or rupture of his aneurysm.  He also may suffer increasing  risk related to his underlying aortic insufficiency.  I believe that he is  an excellent candidate for surgical intervention for aortic root  replacement, resection and grafting of his ascending thoracic aortic  aneurysm.   PLAN:  I have outlined the options at length with Mr. Savitz, his wife, and  his daughter.  Alternative treatment strategies have been discussed in  detail.  They understand and accept all associated risks of surgery  including but not limited to death, stroke, myocardial infarction,  congestive heart failure, respiratory failure, renal failure,  pneumonia,  bleeding requiring blood transfusion, arrhythmia, heart block, and  bradycardia requiring permanent pacemaker.  We have discussed alternatives  with respect to surgical management of his aortic valve and aortic root, and  in particular, discussed relative risks and benefits of a mechanical  prosthesis with need for life-long Coumadin versus use of a bioprosthetic  tissue valve or human allograft valve conduit with associated possible risk  of late valve degeneration or failure.  After considerable discussion,  Mr.  Bonczek requests specifically that we use a mechanical prosthetic valve and  valve conduit in an effort to decrease his life-long risk of late valve  degeneration or failure.  Given his excellent health status and extremely  large dilated aortic root, I certainly concur with this request.  We  tentatively plan to proceed with surgery tomorrow morning for Bentall aortic root replacement using a mechanical prosthetic valve conduit.  All their  questions have been addressed.      CHO/MEDQ  D:  01/18/2005  T:  01/19/2005  Job:  [id number]   cc:   Olga Millers, M.D. [facility]   Lianne Bushy, M.D.  [address]  [place]  [place] [address]  Fax: [phone]

## 2011-05-13 NOTE — Cardiovascular Report (Signed)
NAMEKEENA, HEESCH NO.:  [id number]   MEDICAL RECORD NO.:  [id number]          PATIENT TYPE:  OIB   LOCATION:  2899                         FACILITY:  [facility]   PHYSICIAN:  Charlies Constable, M.D. [facility] DATE OF BIRTH:  [birth year]   DATE OF PROCEDURE:  DATE OF DISCHARGE:                              CARDIAC CATHETERIZATION   CLINICAL HISTORY:  Mr. Wainer is 77 years old, and was referred by Dr.  Purnell Shoemaker by Dr. Jens Som for evaluation of a heart murmur.  He had an  echocardiogram that showed aortic insufficiency, and a large thoracic  aneurysm measuring 6 to 7 cm.  He had a CT scan performed which confirmed a  large aneurysm, and was seen in consultation by __________ and scheduled for  a preoperative evaluation today.   PROCEDURE:  The procedure was performed by the right femoral artery using an  arterial sheath and 6-French preformed coronary catheters.  A front-wall  arterial punch was performed, and Omnipaque contrast was used.  We first  performed an aortic root injection.  We had a great deal of difficulty  selectively entering the left coronary artery.  We were finally able to able  to enter selectively the circumflex and sub selectively the LAD with a  Gibson Ramp AS2. Following completion of the left heart catheterization,  coronary angiography we made the decision to do right heart catheterization.  This was performed percutaneous in the right femoral vein using a V sheath  and Swan-Ganz thermodilution catheter.  The right femoral artery was closed  at the end of the procedure.  The patient tolerated the procedure well, and  left the laboratory in satisfactory condition.   RESULTS:  1.  The right atrial pressure was 6 mean.  The pulmonary artery pressure was      45/19 with a mean of 30.  The pulmonary wedge pressure was 21 mean.      Left ventricular pressure was 138/18.  The aortic pressure was 138/48      with a mean of 77.  2.  Cardiac output/ cardiac index  was 4.5/ 2.1 liters/ minute/ meters      squared by thermodilution.   ANGIOGRAPHIC DATA:  1.  The left main coronary artery:  A short vessel that is free of      significant disease.  2.  Left anterior descending artery:  Gave rise to four diagonal branches      and two septal perforators.  This was a sub selective injection, and      there appeared to be irregularities in the LAD, but no significant      obstruction.  3.  Circumflex artery.  Gave rise to a large marginal branch and small AV      branch.  The marginal branch had four sub branches, all of which were      free of significant disease.  4.  Right coronary artery.  A moderate-size vessel that gave rise to a conus      branch, a right ventricular branch, posterior descending branch and two      posterolateral branches.  There was 30% narrowing in the proximal right      coronary artery.  There were irregularities throughout the right      coronary artery.  5.  An aortic root injection:  Showed very large aneurysmal dilatation of      the ascending aorta extending up into the arch.  There appeared to be 3+      aortic insufficiency.   LEFT VENTRICULOGRAM:  No left ventriculogram was performed.   CONCLUSION:  1.  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  2.  Moderate-to moderately-severe aortic insufficiency by angiography.  3.  Nonobstructive coronary disease.  4.  Mild-to-moderate elevation of the pulmonary wedge and pulmonary artery      pressures.   RECOMMENDATIONS:  The patient is scheduled for surgery for repair of his  thoracic aneurysm tomorrow.      BB/MEDQ  D:  01/18/2005  T:  01/18/2005  Job:  [id number]   cc:   Olga Millers, M.D. [facility]   Cardiopulmonary Lab   Lianne Bushy, M.D.  [address]  [place]  [place] [address]  Fax: [phone]

## 2011-05-13 NOTE — H&P (Signed)
[name] [name], [name] [name] NO.:  [id number]   MEDICAL RECORD NO.:  [id number]          PATIENT TYPE:  INP   LOCATION:  1823                         FACILITY:  [facility]   PHYSICIAN:  Willa Rough, M.D.     DATE OF BIRTH:  1928-08-28   DATE OF ADMISSION:  02/10/2005  DATE OF DISCHARGE:                                HISTORY & PHYSICAL   HISTORY OF PRESENT ILLNESS:  [name] [name] is a very pleasant 77 year old  gentleman who recently underwent a Bentall procedure with the replacement of  his ascending aortic root, and the placement in that root of a mechanical  aortic prosthesis.  The surgery was done by Dr. Cornelius Moras.  The patient did very  well.  During that hospitalization, the patient did have some bradycardia,  and there was also some paroxysmal atrial fibrillation.  He was ultimately  evaluated by Dr. Lewayne Bunting of the EP service, and it was felt that he did  not need a pacemaker.  He went home.  He has done relatively well.  However,  at times he has felt fatigued.  He saw Dr. Purnell Shoemaker today, and it was noted  that his heart rate ranged from 40 up to 125 intermittently.  He brought  information with him to the emergency room.  I am now seeing him for his  cardiac admission.   PAST MEDICAL HISTORY:   ALLERGIES:  No known drug allergies.   MEDICATIONS:  1.  Synthroid 0.05 mg daily.  2.  Aspirin 81 mg daily.  3.  Coumadin.  He is on 1-1/2 tablets a day, but I do not know the exact      strength of the tablets.   OTHER MEDICAL PROBLEMS:  See the complete list below.   SOCIAL HISTORY:  The patient does not smoke, and he does not drink alcohol.  He lives with his wife.   FAMILY HISTORY:  There is no strong family history of coronary disease.   REVIEW OF SYSTEMS:  Today, overall, he is feeling relatively well.  He just  has periods of fatigue.  He is not having any headaches or major eye  difficulties.  He is eating, and has no GI symptoms, and is moving his  bowels  without difficulty.  The patient does have BPH.  He is not having any  voiding difficulties.  His medicines have been adjusted, and see the  discussion below.  Otherwise, his review of systems is negative.   PHYSICAL EXAMINATION:  VITAL SIGNS:  Here at this time, his blood pressure  is stable.  His respiratory rate is 19.  His pulse ranges from 40 to 125.  Temperature is 98.4.  LUNGS:  The lungs reveal a few scattered rhonchi.  NECK:  Normal.  HEENT:  No marked abnormalities.  CARDIAC:  His chest is nicely healed.  There is a crisp closure sound of his  aortic prosthesis, and there is a soft flow murmur.  ABDOMEN:  Benign.  He has no significant peripheral edema.  There are no  major musculoskeletal deformities.   There are multiple EKGs showing  rhythms that include sinus rhythm, and a  rapid regular supraventricular tachycardia at a rate of 125.  The patient  has diffuse inferolateral T wave inversions.  These were present during his  hospitalization.  Today's chest x-ray and labs are pending.   PROBLEMS:  1.  Status post Bentall procedure with replacement of the aortic root with a      mechanical aortic valve in the prosthesis.  2.  Coumadin therapy.  His INR is pending.  3.  Thyroid treatment.  His follow up TSH is pending.  4.  History of very minimal coronary disease at the time of his      catheterization.  There were no bypasses done at the time of surgery.  5.  BPH (benign prostatic hypertrophy).  The patient was seen by Dr.      Vonita Moss and Dr. Vernie Ammons.  The patient went home on Hytrin.  He had some      decreased pressure, and Dr. Purnell Shoemaker stopped the Hytrin.  Flomax was tried,      and I believe the patient took only one dose and felt poorly, and Dr.      Purnell Shoemaker stopped the Flomax.  The patient says his urinary output has been      okay.  6.  History of total knee replacement in the past.  7.  *Bradycardia-tachycardia syndrome currently with rates alternating      between  40 and 125.  I will not be adding any medications this evening,      and this will be assessed further in the morning.  8.  Creatinine history in the 1.5 to 1.7 range.  9.  Left ventricular dysfunction.  By echo, prior to his surgery, the office      echo was read as showing an ejection fraction in the 45% to 55% range      with aortic insufficiency and moderate increase in left ventricular      size.  There was a postoperative two-dimensional echocardiogram done on      January 24, 2005 in the hospital.  The findings are not discussed at      length in the cart at that time.  However, the ejection fraction then      was read as 25% to 35% with an akinesis of the anteroseptal wall, and      severe anterolateral hypokinesis.  There was also mild to moderate right      ventricular dysfunction.  It is not clear if there was any injury around      the time of surgery, but this change in the patient's ejection fraction      will be followed up.   The patient will be admitted and monitored.  I will have the EP team see him  in the morning.  Labs will be obtained.  We will be checking with his wife  to get the exact dosing of his Coumadin.  Repeat 2-D echo will be ordered  for the morning.      JK/MEDQ  D:  02/11/2005  T:  02/11/2005  Job:  [id number]   cc:   Olga Millers, M.D. [facility]   Lianne Bushy, M.D.  [address]  [place]  [place] [address]  Fax: [phone]

## 2011-05-13 NOTE — Op Note (Signed)
NAMEHANDSOME, [name] [name]              ACCOUNT NO.:  [id number]   MEDICAL RECORD NO.:  [id number]          PATIENT TYPE:  INP   LOCATION:  2313                         FACILITY:  [facility]   PHYSICIAN:  [name] [name], M.D. DATE OF BIRTH:  1928/01/03   DATE OF PROCEDURE:  01/19/2005  DATE OF DISCHARGE:                                 OPERATIVE REPORT   PREOPERATIVE DIAGNOSIS:  Aortic insufficiency and ascending thoracic aortic  aneurysm.   POSTOPERATIVE DIAGNOSIS:  Aortic insufficiency and ascending thoracic aortic  aneurysm.   PROCEDURE:  Median sternotomy for Bentall aortic root replacement with  resection and grafting of the ascending thoracic aortic aneurysm (27 mm  CarboMedics mechanical valve conduit)   SURGEON:  [name] [name], M.D.   ASSISTANT:  [name] [name], P.A.-C.   ANESTHESIA:  General.   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  The patient was recently noted to have a murmur on physical examination by  his primary care physician and he was referred for elective cardiac  evaluation. 2-D echocardiogram demonstrates moderate aortic insufficiency  with a large aneurysm involving the aortic root. The patient subsequently  underwent CT angiogram which confirmed the presence of a very large aneurysm  involving the entire ascending thoracic aorta. The patient subsequently  underwent elective left and right heart catheterization. This demonstrated  the absence of any clinically significant coronary artery disease. There is  moderate pulmonary hypertension. A full consultation note has been dictated  previously.   OPERATIVE CONSENT:  The patient and his family have been counseled at length  regarding the indications, risks, and potential benefits of surgery.  Alternative treatment strategies  have been discussed. They understand and  accept all associated risks and desired to proceed as described. After  considerable discussion, [name] has also specifically requested that  mechanical prosthesis be utilized for replacement of the aortic valve. He  understands the need for lifelong anticoagulation with Coumadin and all  associated risks.   OPERATIVE NOTE IN DETAIL:  The patient was brought to the operating room and  above-mentioned date and central monitoring was established by the  anesthesia service under the care and direction of Dr. Diamantina Monks.  Specifically, a Swan-Ganz catheter was placed through the right internal  jugular approach.  A radial arterial line is placed.  Intravenous  antibiotics were administered. Following induction with general endotracheal  anesthesia, a Foley catheter was placed. The patient's chest, abdomen, both  groins, and both lower extremities were prepared, draped in sterile manner.   Baseline transesophageal echocardiogram was performed by Dr. Katrinka Blazing. This  confirms the presence of the least moderate aortic insufficiency. There is  mild mitral regurgitation. There is an extremely large aneurysm involving  the entirety ascending thoracic aorta and the aortic root itself was quite  dilated. The left ventricle was somewhat dilated but otherwise appears to  have normal systolic function.   A median sternotomy incision was performed. The  pericardium was opened. The  ascending aorta is notably extremely dilated and very thin-walled. The  largest portion of the aneurysm involves the aortic root itself which  measures in excess of 7 cm in transverse diameter. The aorta is quite  elongated and remains dilated throughout the majority of the ascending aorta  becoming more normal in caliber at the level of the innominate artery where  it measures approximately 4 cm in transverse diameter. There is no sign of  any atherosclerotic plaque, but rather the  aorta itself was extremely thin-  walled and appears fragile. Due to the large size of the aneurysm extending  all the way to the innominate artery, femoral artery cannulation was chosen  for use for cardiopulmonary bypass.   A small oblique incision is made in the left groin. The left common femoral  artery was dissected from associated structures using sharp dissection.  The  femoral artery is normal in appearance and without any palpable plaques or  calcification. The patient was heparinized systemically. After  heparinization, the left femoral artery was cannulated using a 22-French  straight femoral artery cannula.   A two-stage venous cannula was placed through the right atrium. A retrograde  cardioplegic catheter was placed through the right atrium into the coronary  sinus. Adequate heparinization is verified. Cardiopulmonary bypass was  begun. The heart was notably enlarged with mild to moderate left ventricular  hypertrophy and moderate global biventricular dilatation. The pulmonary  artery is quite enlarged as well. Left ventricular vent was placed through  the right superior pulmonary vein.   The patient is cooled to 28 degrees systemic temperature. The aortic  crossclamp was applied immediately below the level of the innominate artery.  Cardioplegia was initially administered retrograde through the coronary  sinus catheter. Additional cardioplegia was subsequently administered  antegrade through the aortic root.. The initial cardioplegic arrest and  myocardial cooling are felt to be excellent. Iced saline slush was applied  for topical hypothermia. The ascending aortic aneurysm was now transected.  An additional dose of cardioplegia was administered directly into the left  main and the right coronary artery using handheld cardioplegia device.  Repeat doses of cardioplegia were administered intermittently throughout the crossclamp portion of the operation retrograde to the  coronary sinus  catheter and antegrade directly in the left main and right coronary arteries  as needed to maintain septal temperature below 15 degrees centigrade.   The aortic aneurysm was resected to approximately 1 cm below the level of  the aortic clamp distally. The aortic valve was now inspected. Aortic  annulus was quite dilated. The aortic valve was tricuspid and all leaflets  were quite thin. There is no annular calcification. The left main coronary  artery and the right coronary artery were mobilized on small buttons of  aortic wall. Aortic valve was then excised sharply. The aortic annulus was  sized to accept a 27 mm aortic valve prosthesis.   Bentall aortic root replacement was performed using a CarboMedic valve  conduit (model number CPA - 027, serial number [id number] - B). The proximal  suture line was constructed using interrupted horizontal mattress 2-0  Ethibond pledgeted sutures with pledgets in the supra-annular position.  After completion of the proximal suture line, the suture line was reinforced  with BioGlue. The left main coronary artery was then reimplanted onto the  proximal aspect of the valve conduit. Thermal cautery was utilized to create  a small hole in the conduit itself and the button of the  left main coronary  artery was trimmed to appropriate size. The left main was then reimplanted  using running 6-0 Prolene suture. Similarly, the right coronary artery was  reimplanted on the proximal aspect of the graft after creating another small  hole in the graft with thermal cautery. This was again performed with  running 6-0 Prolene suture. The distal end of the graft was now trimmed to  appropriate length and beveled correspondingly to create the distal  anastomoses. The deep portion of the anastomosis was begun by placing  interrupted pledgeted horizontal mattress 2-0 Ethibond sutures with pledgets  on the native distal aortic side. The remainder of the distal  anastomosis  was completed using a two-layer closure of running 4-0 Prolene suture. All  remaining suture lines were reinforced with BioGlue.   A McGoon needle was placed directly in the ascending aortic graft to  facilitate evacuation of air through the aortic root. The patient was placed  in Trendelenburg position. One final dose of warm retrograde hot shot  cardioplegia was administered. The lungs were ventilated and the heart  allowed to fill to facilitate evacuation of all residual air. Aortic  crossclamp was removed after a total crossclamp time of 127 minutes.   The heart was defibrillated and subsequently a junctional rhythm resumed.  All suture lines were inspected for hemostasis. Epicardial pacing wires were  fixed to the right ventricular outflow tract and to the right atrial  appendage. The patient is rewarmed to 37 degrees centigrade temperature. The left ventricular vent was removed. The retrograde cardioplegic catheter was  removed.   The patient was weaned from cardiopulmonary bypass without difficulty. The  patient's rhythm at separation from bypass was sinus rhythm with second  degree AV block. AV sequential pacing was employed. No inotropic support was  required. Total cardiopulmonary bypass time the operation 147 minutes.   Follow-up transesophageal echocardiogram performed by Dr. Katrinka Blazing after  separation from bypass demonstrates preserved left ventricular function.  There was insignificant residual air within a few minutes after separation  from bypass. No other significant abnormalities were identified. The  prosthetic valve appears to be functioning normally. The aortic root vent  was now removed. The venous cannula was removed.   The left femoral cannula was removed. The femoral artery was repaired using  running 6-0 Prolene suture. The left lower extremity was then reperfused  after a total ischemic time of 174 minutes. Protamine was administered to  reverse  the anticoagulation.   The mediastinum was irrigated with saline solution containing vancomycin.  Meticulous surgical hemostasis was ascertained. The mediastinum was drained  using two chest tubes exited through separate stab incisions inferiorly. The  median sternotomy was closed in routine fashion. The soft tissues anterior  to the sternum were closed in multiple layers and the skin was closed with a  running subcuticular skin closure.   The left groin incision was irrigated with saline solution and inspected for  hemostasis. The groin incision was then closed using multiple layers with  running absorbable suture followed by running subcuticular skin closure.   The patient tolerated procedure well and was transported to the surgical  intensive care unit in stable condition. There were no intraoperative  complications. All sponge, instrument and needle counts were verified  correct at completion of the operation. No blood products were administered.      CHO/MEDQ  D:  01/19/2005  T:  01/19/2005  Job:  [id number]   cc:   Patrica Duel, M.D.  [phone]  [address], [address]  [place]  [place] [address]  Fax: [phone]   Lianne Bushy, M.D.  [address]  [place]  [place] [address]  Fax: [phone]

## 2011-05-13 NOTE — Op Note (Signed)
[facility]  Patient:    [name] [name], [name] [name]                       MRN: [id number] Proc. Date: 07/26/00 Adm. Date:  [id number] Attending:  Marlowe Sh[place] CC:         Artist Pais Mina Marble, M.D. x 2                           Operative Report  PREOPERATIVE DIAGNOSIS:  Right carpal tunnel syndrome.  POSTOPERATIVE DIAGNOSIS:  Right carpal tunnel syndrome.  PROCEDURE:  Right carpal tunnel release.  SURGEON:  Artist Pais. Mina Marble, M.D.  ASSISTANT:  Junius Roads. I[place]on, P.A.C.  ANESTHESIA:  General anesthesia.  TOURNIQUET TIME:  11 minutes.  COMPLICATIONS:  None.  DESCRIPTION OF PROCEDURE:  The patient was taken to the operating room and after the induction of adequate general anesthesia, the right upper extremity was prepped and draped in the usual sterile fashion.  An Esmarch was used to exsanguinate the limb and tourniquet was inflated to 250 mmHg.  At this point in time, a 1.5 to 2 cm incision was made in the palmar aspect of the right hand in line with the radial border of the ring finger in the area of the thenar crease.  The incision was taken down through the skin and subcutaneous tissues until the palmar fascia was identified.  The palmar fascia was split longitudinally thus exposing the distal 1/3 of the transverse carpal ligament.  The distal 1/3 of the transverse carpal ligament was divided with a 15 blade thus exposing the underlying median nerve and contents of the carpal canal. At this point in time using the [employer] security carpal tunnel release system, the remaining 2/3 of the transverse carpal ligament was divided in its entirety.  Visualization of the canal revealed no osseous lesions or ganglions present.  The wound was thoroughly irrigated and then closed with a running 3-0 Prolene subcuticular stitch.  Steri-Strips, 4 x 4s, fluffs, and a compressive dressing was applied.  5 cc of Marcaine was also injected into the wound for  postoperative pain control.  The patient tolerated the procedure well and went to the recovery room in stable condition. DD:  07/26/00 TD:  07/26/00 Job: [id number]

## 2011-05-15 ENCOUNTER — Encounter: Payer: Self-pay | Admitting: *Deleted

## 2011-07-21 ENCOUNTER — Other Ambulatory Visit: Payer: Self-pay | Admitting: Internal Medicine

## 2011-07-21 ENCOUNTER — Ambulatory Visit (INDEPENDENT_AMBULATORY_CARE_PROVIDER_SITE_OTHER): Payer: Medicare Other | Admitting: *Deleted

## 2011-07-21 DIAGNOSIS — I428 Other cardiomyopathies: Secondary | ICD-10-CM

## 2011-07-21 DIAGNOSIS — Z950 Presence of cardiac pacemaker: Secondary | ICD-10-CM

## 2011-07-21 DIAGNOSIS — I495 Sick sinus syndrome: Secondary | ICD-10-CM

## 2011-07-22 LAB — REMOTE PACEMAKER DEVICE
AL AMPLITUDE: 2.889 mv
ATRIAL PACING PM: 67.43
BAMS-0001: 170 {beats}/min
RV LEAD IMPEDENCE PM: 536 Ohm

## 2011-07-26 NOTE — Progress Notes (Signed)
Pacer remote check  

## 2011-08-02 ENCOUNTER — Encounter: Payer: Self-pay | Admitting: *Deleted

## 2011-09-26 ENCOUNTER — Encounter: Payer: Self-pay | Admitting: Internal Medicine

## 2011-09-27 ENCOUNTER — Encounter: Payer: Self-pay | Admitting: Internal Medicine

## 2011-09-27 ENCOUNTER — Ambulatory Visit (INDEPENDENT_AMBULATORY_CARE_PROVIDER_SITE_OTHER): Payer: Medicare Other | Admitting: Internal Medicine

## 2011-09-27 DIAGNOSIS — Z950 Presence of cardiac pacemaker: Secondary | ICD-10-CM

## 2011-09-27 DIAGNOSIS — I495 Sick sinus syndrome: Secondary | ICD-10-CM

## 2011-09-27 DIAGNOSIS — I428 Other cardiomyopathies: Secondary | ICD-10-CM

## 2011-09-27 DIAGNOSIS — I10 Essential (primary) hypertension: Secondary | ICD-10-CM

## 2011-09-27 LAB — PACEMAKER DEVICE OBSERVATION
AL IMPEDENCE PM: 544 Ohm
AL THRESHOLD: 0.5 V
RV LEAD AMPLITUDE: 6.5 mv
RV LEAD IMPEDENCE PM: 568 Ohm
RV LEAD THRESHOLD: 1 V

## 2011-09-27 NOTE — Assessment & Plan Note (Signed)
His device is working normally. We'll plan to recheck in several months. 

## 2011-09-27 NOTE — Patient Instructions (Signed)
Your physician wants you to follow-up in: 12 months with Dr Stevan Born will receive a reminder letter in the mail two months in advance. If you don't receive a letter, please call our office to schedule the follow-up appointment.  Your physician wants you to follow-up in: 12/29/2011 You will receive a reminder letter in the mail two months in advance. If you don't receive a letter, please call our office to schedule the follow-up appointment.

## 2011-09-27 NOTE — Assessment & Plan Note (Signed)
His blood pressure is elevated today. I discussed the importance of maintaining a low-sodium diet and continue his current medical therapy.

## 2011-09-27 NOTE — Progress Notes (Signed)
HPI [name] returns today for followup. He is a very pleasant 77 year old man with a history of aortic valve replacement, symptomatic tachybradycardia syndrome, status post pacemaker insertion, hypertension, and paroxysmal atrial fibrillation. The patient denies syncope, chest pain, or shortness of breath. He continues to remain fairly active despite his advanced age. No Known Allergies   Current Outpatient Prescriptions  Medication Sig Dispense Refill  . aspirin 81 MG tablet Take 81 mg by mouth daily.        [name] diltiazem (CARDIZEM CD) 240 MG 24 hr capsule Take 240 mg by mouth daily.        [name] levothyroxine (SYNTHROID, LEVOTHROID) 50 MCG tablet Take 50 mcg by mouth daily.        [name] warfarin (COUMADIN) 6 MG tablet Take 5.5 mg by mouth. As directed         Past Medical History  Diagnosis Date  . Elevated PSA   . Aortic insufficiency   . Thoracic aortic aneurysm   . Carpal tunnel syndrome   . SVT (supraventricular tachycardia)   . Amaurosis fugax   . BPH (benign prostatic hypertrophy)   . Hypothyroidism   . Cardiac pacemaker     Medtronic    ROS:   All systems reviewed and negative except as noted in the HPI.   Past Surgical History  Procedure Date  . Insert / replace / remove pacemaker     Medtronic  . Median sternotomy   . Total knee arthroplasty     right  . Carpal tunnel release     right     No family history on file.   History   Social History  . Marital Status: Married    Spouse Name: N/A    Number of Children: 3  . Years of Education: N/A   Occupational History  . retired     Visual merchandiser   Social History Main Topics  . Smoking status: Never Smoker   . Smokeless tobacco: Not on file  . Alcohol Use: No  . Drug Use: No  . Sexually Active: Not on file   Other Topics Concern  . Not on file   Social History Narrative  . No narrative on file     BP 157/90  Pulse 63  Ht 6' (1.829 m)  Wt 192 lb 6.4 oz (87.272 kg)  BMI 26.09 kg/m2  Physical  Exam:  Well appearing elderly man, NAD HEENT: Unremarkable Neck:  No JVD, no thyromegally Lymphatics:  No adenopathy Back:  No CVA tenderness Lungs:  Clear with no wheezes, rales, or rhonchi. HEART:  Regular rate rhythm, no murmurs, no rubs, no clicks Abd:  soft, positive bowel sounds, no organomegally, no rebound, no guarding Ext:  2 plus pulses, no edema, no cyanosis, no clubbing Skin:  No rashes no nodules Neuro:  CN II through XII intact, motor grossly intact  DEVICE  Normal device function.  See PaceArt for details.   Assess/Plan:

## 2011-09-28 ENCOUNTER — Other Ambulatory Visit: Payer: Self-pay | Admitting: *Deleted

## 2011-09-29 ENCOUNTER — Other Ambulatory Visit: Payer: Self-pay

## 2011-09-30 ENCOUNTER — Other Ambulatory Visit: Payer: Self-pay | Admitting: *Deleted

## 2011-09-30 MED ORDER — DILTIAZEM HCL ER COATED BEADS 240 MG PO CP24: 240.0000 mg | ORAL_CAPSULE | Freq: Every day | ORAL | Status: DC

## 2011-11-01 ENCOUNTER — Ambulatory Visit (INDEPENDENT_AMBULATORY_CARE_PROVIDER_SITE_OTHER): Payer: Medicare Other | Admitting: Cardiology

## 2011-11-01 ENCOUNTER — Encounter: Payer: Self-pay | Admitting: Cardiology

## 2011-11-01 DIAGNOSIS — Z8679 Personal history of other diseases of the circulatory system: Secondary | ICD-10-CM

## 2011-11-01 DIAGNOSIS — I10 Essential (primary) hypertension: Secondary | ICD-10-CM

## 2011-11-01 DIAGNOSIS — I428 Other cardiomyopathies: Secondary | ICD-10-CM

## 2011-11-01 DIAGNOSIS — Z950 Presence of cardiac pacemaker: Secondary | ICD-10-CM

## 2011-11-01 DIAGNOSIS — I712 Thoracic aortic aneurysm, without rupture: Secondary | ICD-10-CM

## 2011-11-01 NOTE — Assessment & Plan Note (Signed)
Blood pressure elevated but he states typically 130/80. Continue present medications and monitor.

## 2011-11-01 NOTE — Assessment & Plan Note (Signed)
Improved on most recent echocardiogram. 

## 2011-11-01 NOTE — Patient Instructions (Signed)
Your physician wants you to follow-up in: 1 year. You will receive a reminder letter in the mail two months in advance. If you don't receive a letter, please call our office to schedule the follow-up appointment.  

## 2011-11-01 NOTE — Assessment & Plan Note (Signed)
Continue Cardizem. 

## 2011-11-01 NOTE — Assessment & Plan Note (Signed)
Plan repeat CT scan of previous repair when he returns in one year.

## 2011-11-01 NOTE — Assessment & Plan Note (Signed)
Continued SBE prophylaxis. Repeat echocardiogram when he returns in one year. 

## 2011-11-01 NOTE — Progress Notes (Signed)
HPI:[name] [name] is a gentleman who has a history of Bentall procedure with a St. Jude aortic valve replacement as well as pacemaker placement  in 2006.  His most recent echocardiogram was performed in Nov 2011.  At that time, he was found to have an ejection fraction of 50-55%.  There was a St. Jude mechanical prosthesis with mild aortic insufficiency; there was mild MR. There was moderate to severe biatrial enlargement, mild RVE and reduced RV Function; moderate TR. I last saw him in Nov 2011. Since then, the patient denies any dyspnea on exertion, orthopnea, PND, pedal edema, palpitations, syncope or chest pain.   Current Outpatient Prescriptions  Medication Sig Dispense Refill  . aspirin 81 MG tablet Take 81 mg by mouth daily.        [name] diltiazem (CARDIZEM CD) 240 MG 24 hr capsule Take 1 capsule (240 mg total) by mouth daily.  30 capsule  12  . levothyroxine (SYNTHROID, LEVOTHROID) 50 MCG tablet Take 50 mcg by mouth daily.        [name] warfarin (COUMADIN) 6 MG tablet Take 5.5 mg by mouth. As directed         Past Medical History  Diagnosis Date  . Elevated PSA   . Aortic insufficiency   . Thoracic aortic aneurysm   . Carpal tunnel syndrome   . SVT (supraventricular tachycardia)   . Amaurosis fugax   . BPH (benign prostatic hypertrophy)   . Hypothyroidism   . Cardiac pacemaker     Medtronic    Past Surgical History  Procedure Date  . Insert / replace / remove pacemaker     Medtronic  . Median sternotomy   . Total knee arthroplasty     right  . Carpal tunnel release     right  . Aortic valve replacement     History   Social History  . Marital Status: Married    Spouse Name: N/A    Number of Children: 3  . Years of Education: N/A   Occupational History  . retired     Visual merchandiser   Social History Main Topics  . Smoking status: Never Smoker   . Smokeless tobacco: Not on file  . Alcohol Use: No  . Drug Use: No  . Sexually Active: Not on file   Other Topics Concern  . Not  on file   Social History Narrative  . No narrative on file    ROS: fatigue but no fevers or chills, productive cough, hemoptysis, dysphasia, odynophagia, melena, hematochezia, dysuria, hematuria, rash, seizure activity, orthopnea, PND, pedal edema, claudication. Remaining systems are negative.  Physical Exam: Well-developed well-nourished in no acute distress.  Skin is warm and dry.  HEENT is normal.  Neck is supple. No thyromegaly.  Chest is clear to auscultation with normal expansion.  Cardiovascular exam is regular rate and rhythm. Crisp mechanical valve Abdominal exam nontender or distended. No masses palpated. Extremities show no edema. neuro grossly intact  ECG normal sinus rhythm, left anterior fascicular block.

## 2011-11-01 NOTE — Assessment & Plan Note (Signed)
Management per electrophysiology. 

## 2011-12-29 ENCOUNTER — Encounter: Payer: Self-pay | Admitting: Internal Medicine

## 2011-12-29 ENCOUNTER — Other Ambulatory Visit: Payer: Self-pay | Admitting: Internal Medicine

## 2011-12-29 ENCOUNTER — Ambulatory Visit (INDEPENDENT_AMBULATORY_CARE_PROVIDER_SITE_OTHER): Payer: Medicare Other | Admitting: *Deleted

## 2011-12-29 DIAGNOSIS — I428 Other cardiomyopathies: Secondary | ICD-10-CM

## 2011-12-31 LAB — REMOTE PACEMAKER DEVICE
AL IMPEDENCE PM: 600 Ohm
ATRIAL PACING PM: 65.05
ATRIAL PACING PM: 65.05
BATTERY VOLTAGE: 2.96 V
BATTERY VOLTAGE: 2.96 V
RV LEAD AMPLITUDE: 4.4 mv
RV LEAD IMPEDENCE PM: 568 Ohm

## 2012-01-05 NOTE — Progress Notes (Signed)
Remote pacer check  

## 2012-01-11 ENCOUNTER — Encounter: Payer: Self-pay | Admitting: *Deleted

## 2012-03-29 ENCOUNTER — Encounter: Payer: Self-pay | Admitting: Internal Medicine

## 2012-03-29 ENCOUNTER — Ambulatory Visit (INDEPENDENT_AMBULATORY_CARE_PROVIDER_SITE_OTHER): Payer: Medicare Other | Admitting: *Deleted

## 2012-03-29 DIAGNOSIS — I428 Other cardiomyopathies: Secondary | ICD-10-CM

## 2012-03-29 LAB — REMOTE PACEMAKER DEVICE
AL AMPLITUDE: 2 mv
ATRIAL PACING PM: 60.45
BAMS-0001: 170 {beats}/min
BRDY-0002RA: 60 {beats}/min
BRDY-0003RA: 130 {beats}/min
RV LEAD IMPEDENCE PM: 536 Ohm

## 2012-04-04 ENCOUNTER — Encounter: Payer: Self-pay | Admitting: *Deleted

## 2012-04-12 NOTE — Progress Notes (Signed)
Remote pacer check  

## 2012-06-29 ENCOUNTER — Encounter: Payer: Self-pay | Admitting: Internal Medicine

## 2012-06-29 ENCOUNTER — Ambulatory Visit (INDEPENDENT_AMBULATORY_CARE_PROVIDER_SITE_OTHER): Payer: Medicare Other | Admitting: *Deleted

## 2012-06-29 DIAGNOSIS — I498 Other specified cardiac arrhythmias: Secondary | ICD-10-CM

## 2012-06-29 DIAGNOSIS — Z950 Presence of cardiac pacemaker: Secondary | ICD-10-CM

## 2012-06-29 LAB — REMOTE PACEMAKER DEVICE
AL AMPLITUDE: 2.0266 mv
BAMS-0001: 170 {beats}/min
RV LEAD AMPLITUDE: 5.4579 mv

## 2012-07-20 ENCOUNTER — Encounter: Payer: Self-pay | Admitting: *Deleted

## 2012-09-26 ENCOUNTER — Encounter: Payer: Self-pay | Admitting: *Deleted

## 2012-10-02 ENCOUNTER — Encounter: Payer: Self-pay | Admitting: Internal Medicine

## 2012-10-02 ENCOUNTER — Ambulatory Visit (INDEPENDENT_AMBULATORY_CARE_PROVIDER_SITE_OTHER): Payer: Medicare Other | Admitting: Internal Medicine

## 2012-10-02 VITALS — BP 140/90 | HR 74 | Ht 72.0 in | Wt 197.5 lb

## 2012-10-02 DIAGNOSIS — I498 Other specified cardiac arrhythmias: Secondary | ICD-10-CM

## 2012-10-02 DIAGNOSIS — Z950 Presence of cardiac pacemaker: Secondary | ICD-10-CM

## 2012-10-02 DIAGNOSIS — I10 Essential (primary) hypertension: Secondary | ICD-10-CM

## 2012-10-02 LAB — PACEMAKER DEVICE OBSERVATION
AL AMPLITUDE: 2.501 mv
AL THRESHOLD: 0.5 V
RV LEAD IMPEDENCE PM: 536 Ohm
RV LEAD THRESHOLD: 0.5 V

## 2012-10-02 NOTE — Assessment & Plan Note (Signed)
His Medtronic dual-chamber pacemaker is working normally. We'll plan to recheck in several months. 

## 2012-10-02 NOTE — Progress Notes (Signed)
HPI Mr. Marro returns today for followup. He is a very pleasant 78 year old man with a history of chronic bradycardia, status post permanent pacemaker insertion, paroxysmal atrial fibrillation, and hypertension. He has a history of aortic insufficiency and is status post aortic valve replacement. He denies chest pain or shortness of breath or peripheral edema. His only complaint today is bilateral shoulder pain and left knee pain both due to arthritis. No Known Allergies   Current Outpatient Prescriptions  Medication Sig Dispense Refill  . allopurinol (ZYLOPRIM) 100 MG tablet Take 200 mg by mouth daily.       [name] aspirin 81 MG tablet Take 81 mg by mouth daily.        [name] diltiazem (CARDIZEM CD) 240 MG 24 hr capsule Take 1 capsule (240 mg total) by mouth daily.  30 capsule  12  . levothyroxine (SYNTHROID, LEVOTHROID) 50 MCG tablet Take 50 mcg by mouth daily.        [name] warfarin (COUMADIN) 6 MG tablet Take 5.5 mg by mouth. As directed         Past Medical History  Diagnosis Date  . Elevated PSA   . Aortic insufficiency   . Thoracic aortic aneurysm   . Carpal tunnel syndrome   . SVT (supraventricular tachycardia)   . Amaurosis fugax   . BPH (benign prostatic hypertrophy)   . Hypothyroidism   . Cardiac pacemaker     Medtronic    ROS:   All systems reviewed and negative except as noted in the HPI.   Past Surgical History  Procedure Date  . Insert / replace / remove pacemaker     Medtronic  . Median sternotomy   . Total knee arthroplasty     right  . Carpal tunnel release     right  . Aortic valve replacement      No family history on file.   History   Social History  . Marital Status: Married    Spouse Name: N/A    Number of Children: 3  . Years of Education: N/A   Occupational History  . retired     Visual merchandiser   Social History Main Topics  . Smoking status: Never Smoker   . Smokeless tobacco: Not on file  . Alcohol Use: No  . Drug Use: No  . Sexually Active: Not  on file   Other Topics Concern  . Not on file   Social History Narrative  . No narrative on file     BP 140/90  Pulse 74  Ht 6' (1.829 m)  Wt 197 lb 8 oz (89.585 kg)  BMI 26.79 kg/m2  Physical Exam:  Well appearing elderly man, NAD HEENT: Unremarkable Neck:  No JVD, no thyromegally Lungs:  Clear with no wheezes, rales, or rhonchi. HEART:  Regular rate rhythm, no murmurs, no rubs, no clicks Abd:  soft, positive bowel sounds, no organomegally, no rebound, no guarding Ext:  2 plus pulses, no edema, no cyanosis, no clubbing Skin:  No rashes no nodules Neuro:  CN II through XII intact, motor grossly intact  DEVICE  Normal device function.  See PaceArt for details.   Assess/Plan:

## 2012-10-02 NOTE — Patient Instructions (Addendum)
Your physician wants you to follow-up in: 12 months with DrTAylor You will receive a reminder letter in the mail two months in advance. If you don't receive a letter, please call our office to schedule the follow-up appointment.   Remote monitoring is used to monitor your Pacemaker of ICD from home. This monitoring reduces the number of office visits required to check your device to one time per year. It allows [place] to keep an eye on the functioning of your device to ensure it is working properly. You are scheduled for a device check from home on 01/08/12. You may send your transmission at any time that day. If you have a wireless device, the transmission will be sent automatically. After your physician reviews your transmission, you will receive a postcard with your next transmission date.

## 2012-10-02 NOTE — Assessment & Plan Note (Signed)
His blood pressure remains fairly well-controlled. He checks it on a regular basis at home and notes it typically has systolics in the 120s. He is encouraged to maintain a low-sodium diet.

## 2012-10-10 ENCOUNTER — Encounter: Payer: Self-pay | Admitting: Internal Medicine

## 2012-10-22 ENCOUNTER — Other Ambulatory Visit: Payer: Self-pay | Admitting: Cardiology

## 2012-11-01 ENCOUNTER — Encounter: Payer: Self-pay | Admitting: Cardiology

## 2012-11-01 ENCOUNTER — Ambulatory Visit (INDEPENDENT_AMBULATORY_CARE_PROVIDER_SITE_OTHER): Payer: Medicare Other | Admitting: Cardiology

## 2012-11-01 VITALS — BP 124/82 | HR 77 | Wt 192.0 lb

## 2012-11-01 DIAGNOSIS — Z8679 Personal history of other diseases of the circulatory system: Secondary | ICD-10-CM

## 2012-11-01 DIAGNOSIS — Z9889 Other specified postprocedural states: Secondary | ICD-10-CM

## 2012-11-01 DIAGNOSIS — I359 Nonrheumatic aortic valve disorder, unspecified: Secondary | ICD-10-CM

## 2012-11-01 DIAGNOSIS — R0989 Other specified symptoms and signs involving the circulatory and respiratory systems: Secondary | ICD-10-CM | POA: Insufficient documentation

## 2012-11-01 DIAGNOSIS — Z954 Presence of other heart-valve replacement: Secondary | ICD-10-CM

## 2012-11-01 DIAGNOSIS — I712 Thoracic aortic aneurysm, without rupture: Secondary | ICD-10-CM

## 2012-11-01 LAB — BASIC METABOLIC PANEL
Calcium: 8.9 mg/dL (ref 8.4–10.5)
GFR: 42.95 mL/min — ABNORMAL LOW (ref >60.00)
Potassium: 4.5 mEq/L (ref 3.5–5.1)
Sodium: 136 mEq/L (ref 135–145)

## 2012-11-01 MED ORDER — DILTIAZEM HCL ER COATED BEADS 240 MG PO CP24: 240.0000 mg | ORAL_CAPSULE | Freq: Every day | ORAL | Status: DC

## 2012-11-01 NOTE — Assessment & Plan Note (Signed)
Blood pressure controlled. Continue present medications. 

## 2012-11-01 NOTE — Assessment & Plan Note (Signed)
Management per electrophysiology. 

## 2012-11-01 NOTE — Assessment & Plan Note (Signed)
Continue Cardizem. 

## 2012-11-01 NOTE — Progress Notes (Signed)
   HPI: [name] [name] is a gentleman who has a history of Bentall procedure with a St. Jude aortic valve replacement as well as pacemaker placement in 2006. His most recent echocardiogram was performed in Nov 2011. At that time, he was found to have an ejection fraction of 50-55%. There was a St. Jude mechanical prosthesis with mild aortic insufficiency; there was mild MR. There was moderate to severe biatrial enlargement, mild RVE and reduced RV Function; moderate TR. I last saw him in Nov 2012. Since then, the patient denies any dyspnea on exertion, orthopnea, PND, pedal edema, palpitations, syncope or chest pain.   Current Outpatient Prescriptions  Medication Sig Dispense Refill  . allopurinol (ZYLOPRIM) 100 MG tablet Take 200 mg by mouth daily.       [name] aspirin 81 MG tablet Take 81 mg by mouth daily.        [name] diltiazem (CARDIZEM CD) 240 MG 24 hr capsule TAKE 1 CAPSULE ONCE A DAY--GENERIC FOR CARDIZEM CD  30 capsule  0  . levothyroxine (SYNTHROID, LEVOTHROID) 50 MCG tablet Take 50 mcg by mouth daily.        [name] warfarin (COUMADIN) 5 MG tablet Take 5 mg by mouth as directed.         Past Medical History  Diagnosis Date  . Elevated PSA   . Aortic insufficiency   . Thoracic aortic aneurysm   . Carpal tunnel syndrome   . SVT (supraventricular tachycardia)   . Amaurosis fugax   . BPH (benign prostatic hypertrophy)   . Hypothyroidism   . Cardiac pacemaker     Medtronic    Past Surgical History  Procedure Date  . Insert / replace / remove pacemaker     Medtronic  . Median sternotomy   . Total knee arthroplasty     right  . Carpal tunnel release     right  . Aortic valve replacement     History   Social History  . Marital Status: Married    Spouse Name: N/A    Number of Children: 3  . Years of Education: N/A   Occupational History  . retired     Visual merchandiser   Social History Main Topics  . Smoking status: Never Smoker   . Smokeless tobacco: Not on file  . Alcohol Use: No  .  Drug Use: No  . Sexually Active: Not on file   Other Topics Concern  . Not on file   Social History Narrative  . No narrative on file    ROS: problems with arthralgias but no fevers or chills, productive cough, hemoptysis, dysphasia, odynophagia, melena, hematochezia, dysuria, hematuria, rash, seizure activity, orthopnea, PND, pedal edema, claudication. Remaining systems are negative.  Physical Exam: Well-developed well-nourished in no acute distress.  Skin is warm and dry.  HEENT is normal.  Neck is supple.  Chest is clear to auscultation with normal expansion.  Cardiovascular exam is regular rate and rhythm. Crisp mechanical valve sounds. Abdominal exam nontender or distended. No masses palpated. Extremities show no edema. neuro grossly intact  ECG sinus rhythm at a rate of 77. Left axis deviation. Nonspecific ST changes.

## 2012-11-01 NOTE — Patient Instructions (Addendum)
Your physician wants you to follow-up in: ONE YEAR WITH DR Shelda Pal will receive a reminder letter in the mail two months in advance. If you don't receive a letter, please call our office to schedule the follow-up appointment.   Your physician has requested that you have an echocardiogram. Echocardiography is a painless test that uses sound waves to create images of your heart. It provides your doctor with information about the size and shape of your heart and how well your heart's chambers and valves are working. This procedure takes approximately one hour. There are no restrictions for this procedure.   Non-Cardiac CT Angiography (CTA), is a special type of CT scan that uses a computer to produce multi-dimensional views of major blood vessels throughout the body. In CT angiography, a contrast material is injected through an IV to help visualize the blood vessels CTA OF CHEST TO FOLLOW UP THORACIC ANEURYSM REPAIR  Non-Cardiac CT Angiography (CTA), is a special type of CT scan that uses a computer to produce multi-dimensional views of major blood vessels throughout the body. In CT angiography, a contrast material is injected through an IV to help visualize the blood vessels CTA OF ABDOMIN/PELVIS FOR ABDOMINAL BRUIT, R/O ANEURYSM  Your physician recommends that you HAVE LAB WORK TODAY

## 2012-11-01 NOTE — Assessment & Plan Note (Addendum)
Plan repeat CTA of his thoracic aorta. Patient also with abdominal bruit. Will continue scan to abdominal aorta to exclude aneurysm.

## 2012-11-01 NOTE — Assessment & Plan Note (Signed)
Repeat echocardiogram. Continue SBE prophylaxis. 

## 2012-11-05 ENCOUNTER — Encounter: Payer: Self-pay | Admitting: Cardiology

## 2012-11-06 ENCOUNTER — Ambulatory Visit (HOSPITAL_COMMUNITY): Payer: Medicare Other | Attending: Cardiology

## 2012-11-06 ENCOUNTER — Ambulatory Visit (INDEPENDENT_AMBULATORY_CARE_PROVIDER_SITE_OTHER)
Admission: RE | Admit: 2012-11-06 | Discharge: 2012-11-06 | Disposition: A | Discharge status: Home / Self Care | Payer: Medicare Other | Source: Ambulatory Visit | Attending: Cardiology | Admitting: Cardiology

## 2012-11-06 ENCOUNTER — Other Ambulatory Visit: Payer: Medicare Other

## 2012-11-06 DIAGNOSIS — Z8679 Personal history of other diseases of the circulatory system: Secondary | ICD-10-CM

## 2012-11-06 DIAGNOSIS — I359 Nonrheumatic aortic valve disorder, unspecified: Secondary | ICD-10-CM | POA: Insufficient documentation

## 2012-11-06 DIAGNOSIS — I712 Thoracic aortic aneurysm, without rupture: Secondary | ICD-10-CM

## 2012-11-06 DIAGNOSIS — Z9889 Other specified postprocedural states: Secondary | ICD-10-CM

## 2012-11-06 DIAGNOSIS — Z954 Presence of other heart-valve replacement: Secondary | ICD-10-CM | POA: Insufficient documentation

## 2012-11-06 DIAGNOSIS — R0989 Other specified symptoms and signs involving the circulatory and respiratory systems: Secondary | ICD-10-CM

## 2012-11-06 MED ORDER — IOHEXOL 350 MG/ML SOLN
100.0000 mL | Freq: Once | INTRAVENOUS | Status: AC | PRN
  Administered 2012-11-06: 100 mL via INTRAVENOUS

## 2012-11-06 NOTE — Progress Notes (Signed)
Echocardiogram performed.  

## 2012-12-27 ENCOUNTER — Encounter: Payer: Self-pay | Admitting: Internal Medicine

## 2012-12-27 ENCOUNTER — Ambulatory Visit (INDEPENDENT_AMBULATORY_CARE_PROVIDER_SITE_OTHER): Payer: Medicare Other | Admitting: *Deleted

## 2012-12-27 DIAGNOSIS — I498 Other specified cardiac arrhythmias: Secondary | ICD-10-CM

## 2012-12-27 DIAGNOSIS — Z950 Presence of cardiac pacemaker: Secondary | ICD-10-CM

## 2012-12-28 LAB — REMOTE PACEMAKER DEVICE
AL IMPEDENCE PM: 536 Ohm
ATRIAL PACING PM: 39.02
BAMS-0001: 170 {beats}/min
RV LEAD IMPEDENCE PM: 512 Ohm
VENTRICULAR PACING PM: 0.35

## 2013-01-08 ENCOUNTER — Encounter: Payer: Self-pay | Admitting: *Deleted

## 2013-04-01 ENCOUNTER — Other Ambulatory Visit: Payer: Self-pay | Admitting: Internal Medicine

## 2013-04-01 ENCOUNTER — Ambulatory Visit (INDEPENDENT_AMBULATORY_CARE_PROVIDER_SITE_OTHER): Payer: Medicare Other | Admitting: *Deleted

## 2013-04-01 DIAGNOSIS — I498 Other specified cardiac arrhythmias: Secondary | ICD-10-CM

## 2013-04-01 DIAGNOSIS — Z950 Presence of cardiac pacemaker: Secondary | ICD-10-CM

## 2013-04-05 LAB — REMOTE PACEMAKER DEVICE
AL AMPLITUDE: 2.3 mv
BATTERY VOLTAGE: 2.93 V
RV LEAD AMPLITUDE: 4.1 mv

## 2013-04-23 ENCOUNTER — Encounter: Payer: Self-pay | Admitting: *Deleted

## 2013-04-25 ENCOUNTER — Encounter: Payer: Self-pay | Admitting: Internal Medicine

## 2013-07-08 ENCOUNTER — Ambulatory Visit (INDEPENDENT_AMBULATORY_CARE_PROVIDER_SITE_OTHER): Payer: Medicare Other | Admitting: *Deleted

## 2013-07-08 DIAGNOSIS — Z950 Presence of cardiac pacemaker: Secondary | ICD-10-CM

## 2013-07-08 DIAGNOSIS — I498 Other specified cardiac arrhythmias: Secondary | ICD-10-CM

## 2013-07-15 LAB — REMOTE PACEMAKER DEVICE
AL AMPLITUDE: 2.8 mv
AL IMPEDENCE PM: 568 Ohm
ATRIAL PACING PM: 28.35
BAMS-0001: 170 {beats}/min
RV LEAD AMPLITUDE: 4.8 mv
RV LEAD IMPEDENCE PM: 472 Ohm

## 2013-08-01 ENCOUNTER — Encounter: Payer: Self-pay | Admitting: Internal Medicine

## 2013-08-02 ENCOUNTER — Telehealth: Payer: Self-pay | Admitting: *Deleted

## 2013-08-02 NOTE — Telephone Encounter (Signed)
Spoke with pt wife, Follow up scheduled  

## 2013-08-02 NOTE — Telephone Encounter (Signed)
Left message for pt to call, we received a fax from [facility], the pt is need clearance for procedure. Per dr Jens Som, he would like to see the pt to give clearance. Pt to call to schedule.

## 2013-08-05 ENCOUNTER — Encounter: Payer: Self-pay | Admitting: Internal Medicine

## 2013-08-12 ENCOUNTER — Ambulatory Visit (INDEPENDENT_AMBULATORY_CARE_PROVIDER_SITE_OTHER): Payer: Medicare Other | Admitting: Cardiology

## 2013-08-12 ENCOUNTER — Encounter: Payer: Self-pay | Admitting: Cardiology

## 2013-08-12 VITALS — BP 132/90 | HR 67 | Ht 72.0 in | Wt 194.1 lb

## 2013-08-12 DIAGNOSIS — I712 Thoracic aortic aneurysm, without rupture: Secondary | ICD-10-CM

## 2013-08-12 DIAGNOSIS — I10 Essential (primary) hypertension: Secondary | ICD-10-CM

## 2013-08-12 DIAGNOSIS — Z8679 Personal history of other diseases of the circulatory system: Secondary | ICD-10-CM

## 2013-08-12 DIAGNOSIS — Z954 Presence of other heart-valve replacement: Secondary | ICD-10-CM

## 2013-08-12 DIAGNOSIS — Z950 Presence of cardiac pacemaker: Secondary | ICD-10-CM

## 2013-08-12 DIAGNOSIS — Z01810 Encounter for preprocedural cardiovascular examination: Secondary | ICD-10-CM | POA: Insufficient documentation

## 2013-08-12 NOTE — Assessment & Plan Note (Signed)
Repeat CTA November 2014.

## 2013-08-12 NOTE — Assessment & Plan Note (Signed)
Management per electrophysiology. 

## 2013-08-12 NOTE — Progress Notes (Signed)
   HPI: [name] is a gentleman who has a history of Bentall procedure with a St. Jude aortic valve replacement as well as pacemaker placement in 2006. His most recent echocardiogram was performed in Nov 2013. His ejection fraction was 45-50% with mild left ventricular hypertrophy. There was a bioprosthetic aortic valve with mild aortic insufficiency. Mild biatrial enlargement and moderate pulmonic insufficiency. CTA in November of 2013 showed aortic aneurysm of 4.6 cm which is stable; no abdominal aortic aneurysm noted. I last saw him in Nov 2013. Since then, the patient denies any dyspnea on exertion, orthopnea, PND, pedal edema, palpitations, syncope or chest pain. The patient will prior arthroscopic knee surgery and we were asked to evaluate preoperatively.   Current Outpatient Prescriptions  Medication Sig Dispense Refill  . allopurinol (ZYLOPRIM) 100 MG tablet Take 200 mg by mouth daily.       [name] aspirin 81 MG tablet Take 81 mg by mouth daily.        [name] diltiazem (CARDIZEM CD) 240 MG 24 hr capsule Take 1 capsule (240 mg total) by mouth daily.  30 capsule  12  . levothyroxine (SYNTHROID, LEVOTHROID) 50 MCG tablet Take 50 mcg by mouth daily.        [name] warfarin (COUMADIN) 5 MG tablet Take 5 mg by mouth as directed.       No current facility-administered medications for this visit.     Past Medical History  Diagnosis Date  . Elevated PSA   . Aortic insufficiency   . Thoracic aortic aneurysm   . Carpal tunnel syndrome   . SVT (supraventricular tachycardia)   . Amaurosis fugax   . BPH (benign prostatic hypertrophy)   . Hypothyroidism   . Cardiac pacemaker     Medtronic    Past Surgical History  Procedure Laterality Date  . Insert / replace / remove pacemaker      Medtronic  . Median sternotomy    . Total knee arthroplasty      right  . Carpal tunnel release      right  . Aortic valve replacement      History   Social History  . Marital Status: Married    Spouse Name: N/A     Number of Children: 3  . Years of Education: N/A   Occupational History  . retired     Visual merchandiser   Social History Main Topics  . Smoking status: Never Smoker   . Smokeless tobacco: Not on file  . Alcohol Use: No  . Drug Use: No  . Sexual Activity: Not on file   Other Topics Concern  . Not on file   Social History Narrative  . No narrative on file    ROS: no fevers or chills, productive cough, hemoptysis, dysphasia, odynophagia, melena, hematochezia, dysuria, hematuria, rash, seizure activity, orthopnea, PND, pedal edema, claudication. Remaining systems are negative.  Physical Exam: Well-developed well-nourished in no acute distress.  Skin is warm and dry.  HEENT is normal.  Neck is supple.  Chest is clear to auscultation with normal expansion.  Cardiovascular exam is regular rate and rhythm. Crisp mechanical valve sounds Abdominal exam nontender or distended. No masses palpated. Extremities show no edema. neuro grossly intact  ECG sinus rhythm at a rate of 67. Nonspecific ST changes.

## 2013-08-12 NOTE — Assessment & Plan Note (Signed)
Continue present medications. 

## 2013-08-12 NOTE — Assessment & Plan Note (Signed)
Given history of prosthetic aortic valve and mildly reduced LV function I think it would be best to plan Lovenox bridge at the time of his upcoming arthroscopic knee surgery. We will arrange.

## 2013-08-12 NOTE — Assessment & Plan Note (Signed)
Continue Cardizem. 

## 2013-08-12 NOTE — Assessment & Plan Note (Signed)
Continue SBE prophylaxis. 

## 2013-08-12 NOTE — Patient Instructions (Addendum)
Your physician recommends that you schedule a follow-up appointment in: AS SCHEDULED  CALL WITH DATE OF SURGERY SO LOVENOX CAN BE STARTED

## 2013-08-13 ENCOUNTER — Encounter: Payer: Self-pay | Admitting: Cardiology

## 2013-08-13 ENCOUNTER — Telehealth: Payer: Self-pay | Admitting: Cardiology

## 2013-08-13 NOTE — Telephone Encounter (Signed)
Received request from Nurse, documents faxed for surgical clearance. To: [facility] Fax number: [phone] Attention:Coni 08/13/13/KM

## 2013-08-16 ENCOUNTER — Encounter: Payer: Self-pay | Admitting: *Deleted

## 2013-08-28 ENCOUNTER — Ambulatory Visit: Payer: Medicare Other | Admitting: Cardiology

## 2013-09-30 ENCOUNTER — Encounter: Payer: Self-pay | Admitting: Internal Medicine

## 2013-10-07 ENCOUNTER — Encounter: Payer: Medicare Other | Admitting: Internal Medicine

## 2013-10-15 ENCOUNTER — Encounter: Payer: Self-pay | Admitting: Internal Medicine

## 2013-10-15 ENCOUNTER — Ambulatory Visit (INDEPENDENT_AMBULATORY_CARE_PROVIDER_SITE_OTHER): Payer: Medicare Other | Admitting: Internal Medicine

## 2013-10-15 VITALS — BP 162/92 | HR 88 | Ht 72.0 in | Wt 194.6 lb

## 2013-10-15 DIAGNOSIS — I4891 Unspecified atrial fibrillation: Secondary | ICD-10-CM

## 2013-10-15 DIAGNOSIS — Z950 Presence of cardiac pacemaker: Secondary | ICD-10-CM

## 2013-10-15 DIAGNOSIS — I498 Other specified cardiac arrhythmias: Secondary | ICD-10-CM

## 2013-10-15 LAB — PACEMAKER DEVICE OBSERVATION
AL AMPLITUDE: 4.2258 mv
AL IMPEDENCE PM: 600 Ohm
AL THRESHOLD: 0.5 V
BATTERY VOLTAGE: 2.92 V
RV LEAD AMPLITUDE: 8.528 mv
RV LEAD IMPEDENCE PM: 568 Ohm

## 2013-10-15 NOTE — Patient Instructions (Signed)

## 2013-10-16 ENCOUNTER — Encounter: Payer: Self-pay | Admitting: Internal Medicine

## 2013-10-16 DIAGNOSIS — I4891 Unspecified atrial fibrillation: Secondary | ICD-10-CM | POA: Insufficient documentation

## 2013-10-16 NOTE — Progress Notes (Signed)
HPI [name] returns today for followup. He is a very pleasant 79 year old man with a history of  Symptomatic sinus bradycardia secondary to sinus node dysfunction, status post permanent pacemaker insertion, paroxysmal atrial fibrillation, and hypertension. He has a history of aortic insufficiency and is status post aortic valve replacement. He denies chest pain or shortness of breath or peripheral edema. His only complaint today is that he has occasional spells where he becomes weak and fatigued and dizzy. He has had no frank syncope. No Known Allergies   Current Outpatient Prescriptions  Medication Sig Dispense Refill  . allopurinol (ZYLOPRIM) 100 MG tablet Take 200 mg by mouth daily.       [name] aspirin 81 MG tablet Take 81 mg by mouth daily.        [name] diltiazem (CARDIZEM CD) 240 MG 24 hr capsule Take 1 capsule (240 mg total) by mouth daily.  30 capsule  12  . levothyroxine (SYNTHROID, LEVOTHROID) 75 MCG tablet Take 75 mcg by mouth daily before breakfast.      . warfarin (COUMADIN) 5 MG tablet Take 5 mg by mouth as directed.       No current facility-administered medications for this visit.     Past Medical History  Diagnosis Date  . Elevated PSA   . Aortic insufficiency   . Thoracic aortic aneurysm   . Carpal tunnel syndrome   . SVT (supraventricular tachycardia)   . Amaurosis fugax   . BPH (benign prostatic hypertrophy)   . Hypothyroidism   . Cardiac pacemaker     Medtronic    ROS:   All systems reviewed and negative except as noted in the HPI.   Past Surgical History  Procedure Laterality Date  . Insert / replace / remove pacemaker      Medtronic  . Median sternotomy    . Total knee arthroplasty      right  . Carpal tunnel release      right  . Aortic valve replacement       No family history on file.   History   Social History  . Marital Status: Married    Spouse Name: N/A    Number of Children: 3  . Years of Education: N/A   Occupational History  .  retired     Visual merchandiser   Social History Main Topics  . Smoking status: Never Smoker   . Smokeless tobacco: Not on file  . Alcohol Use: No  . Drug Use: No  . Sexual Activity: Not on file   Other Topics Concern  . Not on file   Social History Narrative  . No narrative on file     BP 162/92  Pulse 88  Ht 6' (1.829 m)  Wt 194 lb 9.6 oz (88.27 kg)  BMI 26.39 kg/m2  Physical Exam:  Well appearing elderly man, NAD HEENT: Unremarkable Neck:  No JVD, no thyromegally Lungs:  Clear with no wheezes, rales, or rhonchi. Well-healed pacemaker incision HEART:  Regular rate rhythm, soft systolic murmur at the base, no rubs, no clicks Abd:  soft, positive bowel sounds, no organomegally, no rebound, no guarding Ext:  2 plus pulses, no edema, no cyanosis, no clubbing Skin:  No rashes no nodules Neuro:  CN II through XII intact, motor grossly intact  DEVICE  Normal device function.  See PaceArt for details.   Assess/Plan:

## 2013-10-16 NOTE — Assessment & Plan Note (Addendum)
He is out of rhythm approximately 3% of the time. He'll continue his calcium channel blocker. We'll continue systemic anticoagulation. His recent complaints of having spells of shortness of breath and weakness do not correlate with any increase in atrial fibrillation or documented tachycardia. I've asked the patient to record his blood pressure when he has those episodes to be certain that he is not experiencing hypotension.

## 2013-10-16 NOTE — Assessment & Plan Note (Signed)
His Medtronic dual-chamber pacemaker is working normally. We'll plan to recheck in several months. 

## 2013-11-07 ENCOUNTER — Ambulatory Visit (INDEPENDENT_AMBULATORY_CARE_PROVIDER_SITE_OTHER): Payer: Medicare Other | Admitting: Cardiology

## 2013-11-07 ENCOUNTER — Encounter: Payer: Self-pay | Admitting: Cardiology

## 2013-11-07 VITALS — BP 130/90 | HR 82 | Ht 73.0 in | Wt 198.0 lb

## 2013-11-07 DIAGNOSIS — I712 Thoracic aortic aneurysm, without rupture: Secondary | ICD-10-CM

## 2013-11-07 DIAGNOSIS — I4891 Unspecified atrial fibrillation: Secondary | ICD-10-CM

## 2013-11-07 DIAGNOSIS — R55 Syncope and collapse: Secondary | ICD-10-CM

## 2013-11-07 DIAGNOSIS — G459 Transient cerebral ischemic attack, unspecified: Secondary | ICD-10-CM

## 2013-11-07 DIAGNOSIS — R42 Dizziness and giddiness: Secondary | ICD-10-CM

## 2013-11-07 DIAGNOSIS — IMO0001 Reserved for inherently not codable concepts without codable children: Secondary | ICD-10-CM

## 2013-11-07 LAB — BASIC METABOLIC PANEL
BUN: 20 mg/dL (ref 6–23)
CO2: 26 mEq/L (ref 19–32)
Calcium: 9.2 mg/dL (ref 8.4–10.5)
Creatinine, Ser: 1.4 mg/dL (ref 0.4–1.5)
GFR: 50.59 mL/min — ABNORMAL LOW (ref >60.00)
Glucose, Bld: 88 mg/dL (ref 70–99)
Sodium: 139 mEq/L (ref 135–145)

## 2013-11-07 MED ORDER — DILTIAZEM HCL ER COATED BEADS 180 MG PO CP24: 180.0000 mg | ORAL_CAPSULE | Freq: Every day | ORAL | Status: DC

## 2013-11-07 NOTE — Progress Notes (Signed)
HPI: FU history of Bentall procedure with a St. Jude aortic valve replacement as well as pacemaker placement in 2006. His most recent echocardiogram was performed in Nov 2013. His ejection fraction was 45-50% with mild left ventricular hypertrophy. There was a prosthetic aortic valve with mild aortic insufficiency. Mild biatrial enlargement and moderate pulmonic insufficiency. CTA in November of 2013 showed aortic aneurysm of 4.6 cm which is stable; no abdominal aortic aneurysm noted. Patient last seen by Dr. Ladona Ridgel in October of 2014. On device check he was noted to have paroxysmal atrial fibrillation. He also had spells of shortness of breath and weakness that did not correlate with his rhythm disturbance. Dr Ladona Ridgel was concerned the spells may be related to hypotension. Note he apparently had a head CT in [place] that was unremarkable but I do not have those results available. Patient states these dizzy spells have been occurring for approximately one month. They occur at any time of the day. They last several seconds and resolve spontaneously. No associated chest pain, dyspnea or palpitations. They occur both sitting and standing. He did have one syncopal episode.   Current Outpatient Prescriptions  Medication Sig Dispense Refill  . allopurinol (ZYLOPRIM) 100 MG tablet Take 200 mg by mouth daily.       [name] aspirin 81 MG tablet Take 81 mg by mouth daily.        [name] diltiazem (CARDIZEM CD) 180 MG 24 hr capsule Take 1 capsule (180 mg total) by mouth daily.  30 capsule  12  . levothyroxine (SYNTHROID, LEVOTHROID) 75 MCG tablet Take 75 mcg by mouth daily before breakfast.      . warfarin (COUMADIN) 5 MG tablet Take 5 mg by mouth as directed.       No current facility-administered medications for this visit.     Past Medical History  Diagnosis Date  . Elevated PSA   . Aortic insufficiency   . Thoracic aortic aneurysm   . Carpal tunnel syndrome   . SVT (supraventricular tachycardia)   .  Amaurosis fugax   . BPH (benign prostatic hypertrophy)   . Hypothyroidism   . Cardiac pacemaker     Medtronic    Past Surgical History  Procedure Laterality Date  . Insert / replace / remove pacemaker      Medtronic  . Median sternotomy    . Total knee arthroplasty      right  . Carpal tunnel release      right  . Aortic valve replacement      History   Social History  . Marital Status: Married    Spouse Name: N/A    Number of Children: 3  . Years of Education: N/A   Occupational History  . retired     Visual merchandiser   Social History Main Topics  . Smoking status: Never Smoker   . Smokeless tobacco: Not on file  . Alcohol Use: No  . Drug Use: No  . Sexual Activity: Not on file   Other Topics Concern  . Not on file   Social History Narrative  . No narrative on file    ROS: no fevers or chills, productive cough, hemoptysis, dysphasia, odynophagia, melena, hematochezia, dysuria, hematuria, rash, seizure activity, orthopnea, PND, pedal edema, claudication. Remaining systems are negative.  Physical Exam: Well-developed well-nourished in no acute distress.  Skin is warm and dry.  HEENT is normal.  Neck is supple.  Chest is clear to auscultation with normal expansion.  Cardiovascular  exam is regular rate and rhythm. Crisp mechanical valve sounds Abdominal exam nontender or distended. No masses palpated. Extremities show no edema. neuro grossly intact  Electrocardiogram shows sinus rhythm, left anterior fascicular block and no ST changes.

## 2013-11-07 NOTE — Assessment & Plan Note (Signed)
Continue SBE prophylaxis. Repeat echocardiogram. 

## 2013-11-07 NOTE — Assessment & Plan Note (Signed)
Continue Cardizem and Coumadin. 

## 2013-11-07 NOTE — Assessment & Plan Note (Signed)
Repeat CTA of the thoracic aorta.

## 2013-11-07 NOTE — Assessment & Plan Note (Signed)
Followed by electrophysiology. Recently interrogated and functioning normally.

## 2013-11-07 NOTE — Assessment & Plan Note (Addendum)
Patient is having dizzy spells and one episode of syncope. Etiology is not clear to me. His pacemaker was interrogated and functioning normally. His episodes did not correlate with atrial fibrillation. He does not complain of orthostatic symptoms and his events can occur both sitting and standing. He is also not orthostatic in the office today. However he does state that his blood pressure is low with these episodes. Decrease Cardizem to 180 mg daily to see if this improves. Repeat echocardiogram for syncope. Note he has a crisp mechanical valve sound on exam. Doubt TIA. Apparently recent head CT negative. Schedule carotid Dopplers. Patient instructed not to drive.

## 2013-11-07 NOTE — Assessment & Plan Note (Signed)
Patient is describing dizzy spells. He states his systolic blood pressure is 100 during these episodes. Decrease Cardizem to 180 mg daily. Follow blood pressure.

## 2013-11-07 NOTE — Patient Instructions (Signed)
Your physician recommends that you schedule a follow-up appointment in: 6 WEEKS WITH DR CRENSHAW  DECREASE CARDIAZEM TO 180 MG ONCE DAILY  Your physician has requested that you have an echocardiogram. Echocardiography is a painless test that uses sound waves to create images of your heart. It provides your doctor with information about the size and shape of your heart and how well your heart's chambers and valves are working. This procedure takes approximately one hour. There are no restrictions for this procedure.   Your physician has requested that you have a carotid duplex. This test is an ultrasound of the carotid arteries in your neck. It looks at blood flow through these arteries that supply the brain with blood. Allow one hour for this exam. There are no restrictions or special instructions.   CTA OF THE CHEST W AND W/O TO FOLLOW UP THORACIC ANEURYSM

## 2013-11-07 NOTE — Assessment & Plan Note (Signed)
Continue Cardizem. 

## 2013-11-11 ENCOUNTER — Ambulatory Visit (HOSPITAL_COMMUNITY): Payer: Medicare Other | Attending: Cardiology

## 2013-11-11 ENCOUNTER — Encounter: Payer: Self-pay | Admitting: Cardiology

## 2013-11-11 DIAGNOSIS — I2510 Atherosclerotic heart disease of native coronary artery without angina pectoris: Secondary | ICD-10-CM | POA: Insufficient documentation

## 2013-11-11 DIAGNOSIS — R42 Dizziness and giddiness: Secondary | ICD-10-CM | POA: Insufficient documentation

## 2013-11-11 DIAGNOSIS — R55 Syncope and collapse: Secondary | ICD-10-CM

## 2013-11-11 DIAGNOSIS — I4891 Unspecified atrial fibrillation: Secondary | ICD-10-CM | POA: Insufficient documentation

## 2013-11-11 DIAGNOSIS — I6529 Occlusion and stenosis of unspecified carotid artery: Secondary | ICD-10-CM | POA: Insufficient documentation

## 2013-11-11 DIAGNOSIS — G459 Transient cerebral ischemic attack, unspecified: Secondary | ICD-10-CM

## 2013-11-11 DIAGNOSIS — I658 Occlusion and stenosis of other precerebral arteries: Secondary | ICD-10-CM | POA: Insufficient documentation

## 2013-11-11 DIAGNOSIS — I10 Essential (primary) hypertension: Secondary | ICD-10-CM | POA: Insufficient documentation

## 2013-11-13 ENCOUNTER — Ambulatory Visit (INDEPENDENT_AMBULATORY_CARE_PROVIDER_SITE_OTHER)
Admission: RE | Admit: 2013-11-13 | Discharge: 2013-11-13 | Disposition: A | Discharge status: Home / Self Care | Payer: Medicare Other | Source: Ambulatory Visit | Attending: Cardiology | Admitting: Cardiology

## 2013-11-13 DIAGNOSIS — IMO0001 Reserved for inherently not codable concepts without codable children: Secondary | ICD-10-CM

## 2013-11-13 DIAGNOSIS — I712 Thoracic aortic aneurysm, without rupture: Secondary | ICD-10-CM

## 2013-11-20 ENCOUNTER — Ambulatory Visit (HOSPITAL_COMMUNITY): Payer: Medicare Other | Attending: Cardiovascular Disease | Admitting: Radiology

## 2013-11-20 ENCOUNTER — Encounter: Payer: Self-pay | Admitting: Cardiovascular Disease

## 2013-11-20 DIAGNOSIS — I059 Rheumatic mitral valve disease, unspecified: Secondary | ICD-10-CM | POA: Insufficient documentation

## 2013-11-20 DIAGNOSIS — R42 Dizziness and giddiness: Secondary | ICD-10-CM | POA: Insufficient documentation

## 2013-11-20 DIAGNOSIS — I4891 Unspecified atrial fibrillation: Secondary | ICD-10-CM | POA: Insufficient documentation

## 2013-11-20 DIAGNOSIS — Z8673 Personal history of transient ischemic attack (TIA), and cerebral infarction without residual deficits: Secondary | ICD-10-CM | POA: Insufficient documentation

## 2013-11-20 DIAGNOSIS — G459 Transient cerebral ischemic attack, unspecified: Secondary | ICD-10-CM

## 2013-11-20 DIAGNOSIS — I712 Thoracic aortic aneurysm, without rupture: Secondary | ICD-10-CM

## 2013-11-20 DIAGNOSIS — R55 Syncope and collapse: Secondary | ICD-10-CM | POA: Insufficient documentation

## 2013-11-20 DIAGNOSIS — Z952 Presence of prosthetic heart valve: Secondary | ICD-10-CM | POA: Insufficient documentation

## 2013-11-20 NOTE — Progress Notes (Signed)
Echocardiogram performed.  

## 2013-12-20 ENCOUNTER — Encounter: Payer: Self-pay | Admitting: Cardiology

## 2013-12-20 ENCOUNTER — Ambulatory Visit (INDEPENDENT_AMBULATORY_CARE_PROVIDER_SITE_OTHER): Payer: Medicare Other | Admitting: Cardiology

## 2013-12-20 VITALS — BP 162/94 | HR 79 | Ht 72.0 in | Wt 193.0 lb

## 2013-12-20 DIAGNOSIS — I4891 Unspecified atrial fibrillation: Secondary | ICD-10-CM

## 2013-12-20 DIAGNOSIS — R55 Syncope and collapse: Secondary | ICD-10-CM

## 2013-12-20 DIAGNOSIS — Z950 Presence of cardiac pacemaker: Secondary | ICD-10-CM

## 2013-12-20 DIAGNOSIS — I712 Thoracic aortic aneurysm, without rupture: Secondary | ICD-10-CM

## 2013-12-20 DIAGNOSIS — Z954 Presence of other heart-valve replacement: Secondary | ICD-10-CM

## 2013-12-20 DIAGNOSIS — Z8679 Personal history of other diseases of the circulatory system: Secondary | ICD-10-CM

## 2013-12-20 DIAGNOSIS — I10 Essential (primary) hypertension: Secondary | ICD-10-CM

## 2013-12-20 NOTE — Assessment & Plan Note (Signed)
Continue SBE prophylaxis. 

## 2013-12-20 NOTE — Assessment & Plan Note (Signed)
No recurrent dizzy spells. Question related to low blood pressure previously. Continue lower dose of Cardizem.

## 2013-12-20 NOTE — Assessment & Plan Note (Signed)
Continue Cardizem and Coumadin. 

## 2013-12-20 NOTE — Assessment & Plan Note (Addendum)
Repeat CTA November 2015

## 2013-12-20 NOTE — Assessment & Plan Note (Signed)
Blood pressure is mildly elevated but it is controlled at home. Continue present medications.

## 2013-12-20 NOTE — Assessment & Plan Note (Signed)
Followed by electrophysiology. 

## 2013-12-20 NOTE — Assessment & Plan Note (Signed)
Continue Cardizem. 

## 2013-12-20 NOTE — Progress Notes (Signed)
HPI: FU history of Bentall procedure with a St. Jude aortic valve replacement as well as pacemaker placement in 2006. His most recent echocardiogram was performed in Nov 2014. His ejection fraction was 45-50%, grade 1 diastolic dysfunction, bioprosthetic aortic valve with mild aortic insufficiency, mild mitral regurgitation and mild left atrial enlargement. Carotid Dopplers in November of 2014 showed 1-39% bilateral stenosis and followup recommended in one year. CTA in November of 2014 showed stable aneurysmal dilatation of the native descending thoracic aorta distal to the replaced aortic root. Maximal dimension 4.7 cm.  Patient seen by Dr. Ladona Ridgel in October of 2014. On device check he was noted to have paroxysmal atrial fibrillation. He was complaining of dizzy spells that did not correlate with episodes of atrial fibrillation. There was also concern that hypotension was contributing to his symptoms. I decreased his Cardizem at last visit. Since that time, he denies dyspnea, chest pain or syncope. No recurrent dizzy spells.   Current Outpatient Prescriptions  Medication Sig Dispense Refill  . allopurinol (ZYLOPRIM) 100 MG tablet Take 200 mg by mouth daily.       [name] aspirin 81 MG tablet Take 81 mg by mouth daily.        [name] diltiazem (CARDIZEM CD) 180 MG 24 hr capsule Take 1 capsule (180 mg total) by mouth daily.  30 capsule  12  . levothyroxine (SYNTHROID, LEVOTHROID) 75 MCG tablet Take 75 mcg by mouth daily before breakfast.      . warfarin (COUMADIN) 4 MG tablet TAKE AS DIRECTED BY COUMADIN CLINIC DUE TO PT/INR RESULT       No current facility-administered medications for this visit.     Past Medical History  Diagnosis Date  . Elevated PSA   . Aortic insufficiency   . Thoracic aortic aneurysm   . Carpal tunnel syndrome   . SVT (supraventricular tachycardia)   . Amaurosis fugax   . BPH (benign prostatic hypertrophy)   . Hypothyroidism   . Cardiac pacemaker     Medtronic    Past  Surgical History  Procedure Laterality Date  . Insert / replace / remove pacemaker      Medtronic  . Median sternotomy    . Total knee arthroplasty      right  . Carpal tunnel release      right  . Aortic valve replacement      History   Social History  . Marital Status: Married    Spouse Name: N/A    Number of Children: 3  . Years of Education: N/A   Occupational History  . retired     Visual merchandiser   Social History Main Topics  . Smoking status: Never Smoker   . Smokeless tobacco: Not on file  . Alcohol Use: No  . Drug Use: No  . Sexual Activity: Not on file   Other Topics Concern  . Not on file   Social History Narrative  . No narrative on file    ROS: knee arthralgias but no fevers or chills, productive cough, hemoptysis, dysphasia, odynophagia, melena, hematochezia, dysuria, hematuria, rash, seizure activity, orthopnea, PND, pedal edema, claudication. Remaining systems are negative.  Physical Exam: Well-developed well-nourished in no acute distress.  Skin is warm and dry.  HEENT is normal.  Neck is supple.  Chest is clear to auscultation with normal expansion.  Cardiovascular exam is regular rate and rhythm.  Abdominal exam nontender or distended. No masses palpated. Extremities show no edema. neuro grossly intact

## 2013-12-20 NOTE — Patient Instructions (Signed)
Your physician wants you to follow-up in: 6 MONTHS WITH DR CRENSHAW You will receive a reminder letter in the mail two months in advance. If you don't receive a letter, please call our office to schedule the follow-up appointment.  

## 2014-01-16 ENCOUNTER — Ambulatory Visit (INDEPENDENT_AMBULATORY_CARE_PROVIDER_SITE_OTHER): Payer: Medicare Other | Admitting: *Deleted

## 2014-01-16 DIAGNOSIS — I4891 Unspecified atrial fibrillation: Secondary | ICD-10-CM

## 2014-01-17 LAB — MDC_IDC_ENUM_SESS_TYPE_REMOTE
Brady Statistic AP VP Percent: 0.29 %
Brady Statistic AP VS Percent: 35.61 %
Brady Statistic AS VS Percent: 63.46 %
Brady Statistic RV Percent Paced: 0.93 %
Lead Channel Impedance Value: 584 Ohm
Lead Channel Sensing Intrinsic Amplitude: 3.0615
Lead Channel Sensing Intrinsic Amplitude: 4.4346
Lead Channel Setting Pacing Amplitude: 2 V
Lead Channel Setting Pacing Pulse Width: 0.4 ms
Lead Channel Setting Sensing Sensitivity: 0.9 mV
MDC IDC MSMT BATTERY VOLTAGE: 2.9 V
MDC IDC MSMT LEADCHNL RV IMPEDANCE VALUE: 520 Ohm
MDC IDC SESS DTM: 20150122133204
MDC IDC SET LEADCHNL RV PACING AMPLITUDE: 2.5 V
MDC IDC STAT BRADY AS VP PERCENT: 0.64 %
MDC IDC STAT BRADY RA PERCENT PACED: 35.9 %
Zone Setting Detection Interval: 350 ms
Zone Setting Detection Interval: 400 ms

## 2014-01-21 ENCOUNTER — Encounter: Payer: Self-pay | Admitting: *Deleted

## 2014-01-23 ENCOUNTER — Encounter: Payer: Self-pay | Admitting: Internal Medicine

## 2014-04-18 ENCOUNTER — Encounter: Payer: Self-pay | Admitting: Cardiology

## 2014-04-18 NOTE — Telephone Encounter (Signed)
New problem ° ° °Pt returning your call. °

## 2014-04-18 NOTE — Telephone Encounter (Signed)
This encounter was created in error - please disregard.

## 2014-04-21 ENCOUNTER — Ambulatory Visit (INDEPENDENT_AMBULATORY_CARE_PROVIDER_SITE_OTHER): Payer: Medicare Other | Admitting: Cardiology

## 2014-04-21 ENCOUNTER — Ambulatory Visit (INDEPENDENT_AMBULATORY_CARE_PROVIDER_SITE_OTHER): Payer: Medicare Other | Admitting: *Deleted

## 2014-04-21 ENCOUNTER — Encounter: Payer: Self-pay | Admitting: Internal Medicine

## 2014-04-21 ENCOUNTER — Encounter: Payer: Self-pay | Admitting: Cardiology

## 2014-04-21 VITALS — BP 152/84 | HR 102 | Ht 72.0 in | Wt 198.0 lb

## 2014-04-21 DIAGNOSIS — Z01810 Encounter for preprocedural cardiovascular examination: Secondary | ICD-10-CM

## 2014-04-21 DIAGNOSIS — Z8679 Personal history of other diseases of the circulatory system: Secondary | ICD-10-CM

## 2014-04-21 DIAGNOSIS — I4891 Unspecified atrial fibrillation: Secondary | ICD-10-CM

## 2014-04-21 DIAGNOSIS — Z950 Presence of cardiac pacemaker: Secondary | ICD-10-CM

## 2014-04-21 DIAGNOSIS — I495 Sick sinus syndrome: Secondary | ICD-10-CM

## 2014-04-21 DIAGNOSIS — Z954 Presence of other heart-valve replacement: Secondary | ICD-10-CM

## 2014-04-21 DIAGNOSIS — I10 Essential (primary) hypertension: Secondary | ICD-10-CM

## 2014-04-21 DIAGNOSIS — I712 Thoracic aortic aneurysm, without rupture, unspecified: Secondary | ICD-10-CM

## 2014-04-21 LAB — MDC_IDC_ENUM_SESS_TYPE_REMOTE
Brady Statistic AP VS Percent: 21.66 %
Brady Statistic AS VS Percent: 76.67 %
Lead Channel Impedance Value: 464 Ohm
Lead Channel Impedance Value: 480 Ohm
Lead Channel Sensing Intrinsic Amplitude: 1.8542
Lead Channel Sensing Intrinsic Amplitude: 4.7757
Lead Channel Setting Pacing Amplitude: 2 V
Lead Channel Setting Pacing Amplitude: 2.5 V
Lead Channel Setting Sensing Sensitivity: 0.9 mV
MDC IDC MSMT BATTERY VOLTAGE: 2.89 V
MDC IDC SESS DTM: 20150427112622
MDC IDC SET LEADCHNL RV PACING PULSEWIDTH: 0.4 ms
MDC IDC SET ZONE DETECTION INTERVAL: 400 ms
MDC IDC STAT BRADY AP VP PERCENT: 0.59 %
MDC IDC STAT BRADY AS VP PERCENT: 1.09 %
MDC IDC STAT BRADY RA PERCENT PACED: 22.24 %
MDC IDC STAT BRADY RV PERCENT PACED: 1.67 %
Zone Setting Detection Interval: 350 ms

## 2014-04-21 NOTE — Assessment & Plan Note (Signed)
Management per electrophysiology. 

## 2014-04-21 NOTE — Assessment & Plan Note (Signed)
Repeat CT November 2015.

## 2014-04-21 NOTE — Patient Instructions (Signed)
Your physician wants you to follow-up in: November 2015 You will receive a reminder letter in the mail two months in advance. If you don't receive a letter, please call our office to schedule the follow-up appointment.

## 2014-04-21 NOTE — Assessment & Plan Note (Signed)
Continue SBE prophylaxis. 

## 2014-04-21 NOTE — Progress Notes (Signed)
HPI: FU history of Bentall procedure with a St. Jude aortic valve replacement as well as pacemaker placement in 2006. His most recent echocardiogram was performed in Nov 2014. His ejection fraction was 45-50%, grade 1 diastolic dysfunction, bioprosthetic aortic valve with mild aortic insufficiency, mild mitral regurgitation and mild left atrial enlargement. Carotid Dopplers in November of 2014 showed 1-39% bilateral stenosis and followup recommended in one year. CTA in November of 2014 showed stable aneurysmal dilatation of the native descending thoracic aorta distal to the replaced aortic root. Maximal dimension 4.7 cm. Patient seen by Dr. Ladona Ridgelaylor in October of 2014. On device check he was noted to have paroxysmal atrial fibrillation. He was complaining of dizzy spells that did not correlate with episodes of atrial fibrillation. There was also concern that hypotension was contributing to his symptoms. I decreased his Cardizem. Siince last seen, He has some dyspnea on exertion that he attributes to deconditioning because of arthralgias and limited mobility. No orthopnea, PND, pedal edema, chest pain or syncope.   Current Outpatient Prescriptions  Medication Sig Dispense Refill  . allopurinol (ZYLOPRIM) 100 MG tablet Take 200 mg by mouth daily.       [name]. aspirin 81 MG tablet Take 81 mg by mouth daily.        [name]. diltiazem (CARDIZEM CD) 180 MG 24 hr capsule Take 1 capsule (180 mg total) by mouth daily.  30 capsule  12  . levothyroxine (SYNTHROID, LEVOTHROID) 75 MCG tablet Take 75 mcg by mouth daily before breakfast.      . traMADol (ULTRAM) 50 MG tablet Take 50 mg by mouth 2 (two) times daily.      [name]. warfarin (COUMADIN) 4 MG tablet TAKE AS DIRECTED BY COUMADIN CLINIC DUE TO PT/INR RESULT       No current facility-administered medications for this visit.     Past Medical History  Diagnosis Date  . Elevated PSA   . Aortic insufficiency   . Thoracic aortic aneurysm   . Carpal tunnel syndrome     . SVT (supraventricular tachycardia)   . Amaurosis fugax   . BPH (benign prostatic hypertrophy)   . Hypothyroidism   . Cardiac pacemaker     Medtronic    Past Surgical History  Procedure Laterality Date  . Insert / replace / remove pacemaker      Medtronic  . Median sternotomy    . Total knee arthroplasty      right  . Carpal tunnel release      right  . Aortic valve replacement      History   Social History  . Marital Status: Married    Spouse Name: N/A    Number of Children: 3  . Years of Education: N/A   Occupational History  . retired     Visual merchandiserfarmer   Social History Main Topics  . Smoking status: Never Smoker   . Smokeless tobacco: Not on file  . Alcohol Use: No  . Drug Use: No  . Sexual Activity: Not on file   Other Topics Concern  . Not on file   Social History Narrative  . No narrative on file    ROS: no fevers or chills, productive cough, hemoptysis, dysphasia, odynophagia, melena, hematochezia, dysuria, hematuria, rash, seizure activity, orthopnea, PND, pedal edema, claudication. Remaining systems are negative.  Physical Exam: Well-developed well-nourished in no acute distress.  Skin is warm and dry.  HEENT is normal.  Neck is supple.  Chest is clear to  auscultation with normal expansion.  Cardiovascular exam is regular rate and rhythm. Crisp mechanical valve sounds Abdominal exam nontender or distended. No masses palpated. Extremities show no edema. neuro grossly intact  ECG Sinus rhythm at a rate of 102. Couplet. Nonspecific T-wave changes.

## 2014-04-21 NOTE — Assessment & Plan Note (Signed)
Patient had been considering left knee replacement.However he now states he would like to delay for several years if possible. I do not think his cardiac risk would be prohibitive. If he decides to proceed I would like to perform a nuclear study preoperatively. He is having some dyspnea which he attributes to deconditioning because of limited mobility related to arthralgias. He is not volume overloaded.

## 2014-04-21 NOTE — Assessment & Plan Note (Signed)
Continue Cardizem. 

## 2014-04-21 NOTE — Assessment & Plan Note (Signed)
Continue Cardizem and Coumadin. 

## 2014-04-21 NOTE — Assessment & Plan Note (Signed)
Blood pressure controlled. Continue present medications. 

## 2014-05-08 ENCOUNTER — Encounter: Payer: Self-pay | Admitting: Cardiology

## 2014-05-21 ENCOUNTER — Encounter: Payer: Self-pay | Admitting: Internal Medicine

## 2014-05-21 ENCOUNTER — Ambulatory Visit (INDEPENDENT_AMBULATORY_CARE_PROVIDER_SITE_OTHER): Payer: Medicare Other | Admitting: *Deleted

## 2014-05-21 DIAGNOSIS — I498 Other specified cardiac arrhythmias: Secondary | ICD-10-CM

## 2014-05-21 NOTE — Progress Notes (Signed)
Remote pacemaker transmission.   

## 2014-06-03 LAB — MDC_IDC_ENUM_SESS_TYPE_REMOTE
Brady Statistic AS VS Percent: 76.55 %
Brady Statistic RV Percent Paced: 1.4 %
Date Time Interrogation Session: 20150527124342
Lead Channel Impedance Value: 512 Ohm
Lead Channel Sensing Intrinsic Amplitude: 5.4579
Lead Channel Setting Pacing Amplitude: 2 V
MDC IDC MSMT BATTERY VOLTAGE: 2.9 V
MDC IDC MSMT LEADCHNL RA SENSING INTR AMPL: 2.6734
MDC IDC MSMT LEADCHNL RV IMPEDANCE VALUE: 472 Ohm
MDC IDC SET LEADCHNL RV PACING AMPLITUDE: 2.5 V
MDC IDC SET LEADCHNL RV PACING PULSEWIDTH: 0.4 ms
MDC IDC SET LEADCHNL RV SENSING SENSITIVITY: 0.9 mV
MDC IDC SET ZONE DETECTION INTERVAL: 350 ms
MDC IDC STAT BRADY AP VP PERCENT: 0.43 %
MDC IDC STAT BRADY AP VS PERCENT: 22.05 %
MDC IDC STAT BRADY AS VP PERCENT: 0.97 %
MDC IDC STAT BRADY RA PERCENT PACED: 22.48 %
Zone Setting Detection Interval: 400 ms

## 2014-06-03 NOTE — Addendum Note (Signed)
Addended by: Donalynn Furlong on: 06/03/2014 12:01 PM   Modules accepted: Level of Service

## 2014-06-17 ENCOUNTER — Encounter: Payer: Self-pay | Admitting: Cardiology

## 2014-06-23 ENCOUNTER — Ambulatory Visit (INDEPENDENT_AMBULATORY_CARE_PROVIDER_SITE_OTHER): Payer: Medicare Other | Admitting: *Deleted

## 2014-06-23 DIAGNOSIS — I498 Other specified cardiac arrhythmias: Secondary | ICD-10-CM

## 2014-06-23 LAB — MDC_IDC_ENUM_SESS_TYPE_REMOTE
Battery Voltage: 2.88 V
Brady Statistic RA Percent Paced: 18.19 %
Brady Statistic RV Percent Paced: 1.6 %
Date Time Interrogation Session: 20150629122054
Lead Channel Sensing Intrinsic Amplitude: 4.7757
Lead Channel Setting Sensing Sensitivity: 0.9 mV
MDC IDC MSMT LEADCHNL RA IMPEDANCE VALUE: 504 Ohm
MDC IDC MSMT LEADCHNL RA SENSING INTR AMPL: 3.1909
MDC IDC MSMT LEADCHNL RV IMPEDANCE VALUE: 496 Ohm
MDC IDC SET LEADCHNL RA PACING AMPLITUDE: 2 V
MDC IDC SET LEADCHNL RV PACING AMPLITUDE: 2.5 V
MDC IDC SET LEADCHNL RV PACING PULSEWIDTH: 0.4 ms
MDC IDC SET ZONE DETECTION INTERVAL: 350 ms
MDC IDC STAT BRADY AP VP PERCENT: 0.27 %
MDC IDC STAT BRADY AP VS PERCENT: 17.92 %
MDC IDC STAT BRADY AS VP PERCENT: 1.33 %
MDC IDC STAT BRADY AS VS PERCENT: 80.48 %
Zone Setting Detection Interval: 400 ms

## 2014-06-23 NOTE — Progress Notes (Signed)
Remote pacemaker transmission.   

## 2014-06-30 ENCOUNTER — Other Ambulatory Visit: Payer: Self-pay | Admitting: Cardiology

## 2014-07-01 ENCOUNTER — Telehealth: Payer: Self-pay | Admitting: *Deleted

## 2014-07-01 NOTE — Telephone Encounter (Signed)
Pt has his protimes followed by his PCP, dr Marina Goodellperry. Spoke with dr perry's office, they are not able to do the lovenox bridging. Left message for the [facility] to call me to discuss date of procedure.

## 2014-07-01 NOTE — Telephone Encounter (Signed)
Per fax pt is needing prostate ultrasound and biopsy. Per dr Jens Somcrenshaw the pt will require lovenox bridging and also will require SBE prophylaxis.

## 2014-07-02 ENCOUNTER — Encounter: Payer: Self-pay | Admitting: Cardiology

## 2014-07-02 NOTE — Telephone Encounter (Signed)
Spoke with [facility], they will let us know when procedure is scheduled so we can make plans regarding lovenox bridging. Orders given for SBE prior to the procedure. They are to call back with date and time.

## 2014-07-02 NOTE — Telephone Encounter (Signed)
Spoke with Koleen Nimrod[place] at [facility], the pt is scheduled for his procedure 07-14-14. The pt will come to the [place] office Monday 07-07-14 for the lovenox teaching and bridging. Spoke with pt wife, aware of above Also aware the pt needs antibiotics prior to the porcedure. This note will be faxed to the urology center @ [phone].

## 2014-07-07 ENCOUNTER — Ambulatory Visit (INDEPENDENT_AMBULATORY_CARE_PROVIDER_SITE_OTHER): Payer: Medicare Other | Admitting: Pharmacist

## 2014-07-07 DIAGNOSIS — I4891 Unspecified atrial fibrillation: Secondary | ICD-10-CM

## 2014-07-07 LAB — POCT INR: INR: 3.6

## 2014-07-07 MED ORDER — ENOXAPARIN SODIUM 80 MG/0.8ML ~~LOC~~ SOLN: 80.0000 mg | Freq: Two times a day (BID) | SUBCUTANEOUS | Status: DC

## 2014-07-07 NOTE — Patient Instructions (Signed)
7/13- Skip today's dose of Coumadin  7/14- No Coumadin or Lovenox 7/15- No Coumadin or Lovenox  7/16- Lovenox 80mg  in AM and PM 7/17- Lovenox 80 mg in AM and PM 7/18- Lovenox 80 mg in AM and PM 7/19- Take Lovenox 80mg  in AM only  7/20- Day of Procedure- No Coumadin or Lovenox 7/21- Coumadin 6mg  ( 1 1/2 tablets) AND Lovenox 80mg  in AM and PM 7/22- Coumadin 6mg  ( 1 1/2 tablets) AND Lovenox 80mg  in AM and PM 7/23- Coumadin 4mg  (1 tablet) AND Lovenox 80mg  in AM and PM 7/24- Recheck INR

## 2014-07-08 ENCOUNTER — Encounter: Payer: Self-pay | Admitting: Internal Medicine

## 2014-07-18 ENCOUNTER — Ambulatory Visit: Payer: Medicare Other | Admitting: Pharmacist Clinician (PhC)/ Clinical Pharmacy Specialist

## 2014-07-21 ENCOUNTER — Telehealth: Payer: Self-pay | Admitting: Cardiology

## 2014-07-21 NOTE — Telephone Encounter (Signed)
Called spoke with pt's wife, pt was still in [facility] on 07/18/14 was given Lovenox in hospital Sat am, but not clear on hospital discharge to pt's wife if pt was supposed to continue to take Lovenox injections.  She is unsure of his INR at discharge, advised pt needs appt ASAP for INR check to see if he needs to resume Lovenox or if his Coumadin is therapeutic at present.  Made OV for 07/22/14 at [facility].

## 2014-07-21 NOTE — Telephone Encounter (Signed)
He was suppose to have his pt on Friday,but he was in the hospital.He came home on Sat and they gave him a shot of Levonox at the hospital, She wants to know what to do from here.

## 2014-07-22 ENCOUNTER — Ambulatory Visit (INDEPENDENT_AMBULATORY_CARE_PROVIDER_SITE_OTHER): Payer: Medicare Other | Admitting: Pharmacist Clinician (PhC)/ Clinical Pharmacy Specialist

## 2014-07-22 DIAGNOSIS — I359 Nonrheumatic aortic valve disorder, unspecified: Secondary | ICD-10-CM

## 2014-07-22 DIAGNOSIS — Z7901 Long term (current) use of anticoagulants: Secondary | ICD-10-CM | POA: Insufficient documentation

## 2014-07-22 LAB — POCT INR: INR: 1.4

## 2014-07-24 ENCOUNTER — Encounter: Payer: Self-pay | Admitting: Internal Medicine

## 2014-07-24 ENCOUNTER — Ambulatory Visit (INDEPENDENT_AMBULATORY_CARE_PROVIDER_SITE_OTHER): Payer: Medicare Other | Admitting: *Deleted

## 2014-07-24 DIAGNOSIS — I4891 Unspecified atrial fibrillation: Secondary | ICD-10-CM

## 2014-07-24 DIAGNOSIS — Z950 Presence of cardiac pacemaker: Secondary | ICD-10-CM

## 2014-07-24 LAB — MDC_IDC_ENUM_SESS_TYPE_REMOTE
Battery Voltage: 2.89 V
Brady Statistic AP VP Percent: 0.14 %
Brady Statistic AP VS Percent: 25.2 %
Brady Statistic AS VP Percent: 1.94 %
Date Time Interrogation Session: 20150730112740
Lead Channel Sensing Intrinsic Amplitude: 2.2 mV
MDC IDC MSMT LEADCHNL RA IMPEDANCE VALUE: 488 Ohm
MDC IDC MSMT LEADCHNL RV IMPEDANCE VALUE: 480 Ohm
MDC IDC MSMT LEADCHNL RV SENSING INTR AMPL: 5.5 mV
MDC IDC SET LEADCHNL RA PACING AMPLITUDE: 2 V
MDC IDC SET LEADCHNL RV PACING AMPLITUDE: 2.5 V
MDC IDC SET LEADCHNL RV PACING PULSEWIDTH: 0.4 ms
MDC IDC SET LEADCHNL RV SENSING SENSITIVITY: 0.9 mV
MDC IDC SET ZONE DETECTION INTERVAL: 400 ms
MDC IDC STAT BRADY AS VS PERCENT: 72.72 %
MDC IDC STAT BRADY RA PERCENT PACED: 25.34 %
MDC IDC STAT BRADY RV PERCENT PACED: 2.08 %
Zone Setting Detection Interval: 350 ms

## 2014-07-28 ENCOUNTER — Ambulatory Visit (INDEPENDENT_AMBULATORY_CARE_PROVIDER_SITE_OTHER): Payer: Medicare Other | Admitting: Pharmacist Clinician (PhC)/ Clinical Pharmacy Specialist

## 2014-07-28 DIAGNOSIS — Z7901 Long term (current) use of anticoagulants: Secondary | ICD-10-CM

## 2014-07-28 DIAGNOSIS — I359 Nonrheumatic aortic valve disorder, unspecified: Secondary | ICD-10-CM

## 2014-07-28 LAB — POCT INR: INR: 3.2

## 2014-08-08 ENCOUNTER — Encounter: Payer: Self-pay | Admitting: Cardiology

## 2014-08-12 ENCOUNTER — Ambulatory Visit (INDEPENDENT_AMBULATORY_CARE_PROVIDER_SITE_OTHER): Payer: Medicare Other | Admitting: *Deleted

## 2014-08-12 DIAGNOSIS — I359 Nonrheumatic aortic valve disorder, unspecified: Secondary | ICD-10-CM

## 2014-08-12 DIAGNOSIS — Z954 Presence of other heart-valve replacement: Secondary | ICD-10-CM

## 2014-08-12 DIAGNOSIS — Z7901 Long term (current) use of anticoagulants: Secondary | ICD-10-CM

## 2014-08-12 LAB — POCT INR: INR: 2.9

## 2014-08-26 ENCOUNTER — Ambulatory Visit (INDEPENDENT_AMBULATORY_CARE_PROVIDER_SITE_OTHER): Payer: Medicare Other | Admitting: *Deleted

## 2014-08-26 ENCOUNTER — Encounter: Payer: Self-pay | Admitting: Internal Medicine

## 2014-08-26 DIAGNOSIS — Z950 Presence of cardiac pacemaker: Secondary | ICD-10-CM

## 2014-08-26 LAB — MDC_IDC_ENUM_SESS_TYPE_REMOTE
Brady Statistic AP VS Percent: 15.9 %
Brady Statistic AS VP Percent: 1.68 %
Brady Statistic RA Percent Paced: 17.11 %
Brady Statistic RV Percent Paced: 2.89 %
Date Time Interrogation Session: 20150901111424
Lead Channel Setting Pacing Amplitude: 2 V
Lead Channel Setting Pacing Amplitude: 2.5 V
Lead Channel Setting Sensing Sensitivity: 0.9 mV
MDC IDC MSMT BATTERY VOLTAGE: 2.88 V
MDC IDC MSMT LEADCHNL RA IMPEDANCE VALUE: 472 Ohm
MDC IDC MSMT LEADCHNL RA SENSING INTR AMPL: 1.5954
MDC IDC MSMT LEADCHNL RV IMPEDANCE VALUE: 488 Ohm
MDC IDC MSMT LEADCHNL RV SENSING INTR AMPL: 3.7523
MDC IDC SET LEADCHNL RV PACING PULSEWIDTH: 0.4 ms
MDC IDC STAT BRADY AP VP PERCENT: 1.21 %
MDC IDC STAT BRADY AS VS PERCENT: 81.22 %
Zone Setting Detection Interval: 350 ms
Zone Setting Detection Interval: 400 ms

## 2014-08-26 NOTE — Progress Notes (Signed)
Remote pacemaker transmission.   

## 2014-09-02 ENCOUNTER — Ambulatory Visit (INDEPENDENT_AMBULATORY_CARE_PROVIDER_SITE_OTHER): Payer: Medicare Other | Admitting: Pharmacist

## 2014-09-02 DIAGNOSIS — Z7901 Long term (current) use of anticoagulants: Secondary | ICD-10-CM

## 2014-09-02 DIAGNOSIS — I359 Nonrheumatic aortic valve disorder, unspecified: Secondary | ICD-10-CM

## 2014-09-02 DIAGNOSIS — Z954 Presence of other heart-valve replacement: Secondary | ICD-10-CM

## 2014-09-02 LAB — POCT INR: INR: 3.4

## 2014-09-04 ENCOUNTER — Encounter: Payer: Self-pay | Admitting: Cardiology

## 2014-09-24 ENCOUNTER — Telehealth: Payer: Self-pay | Admitting: Internal Medicine

## 2014-09-24 NOTE — Telephone Encounter (Signed)
Spoke with PT, Dot LanesKrista, and let her know his resting HR when he saw Dr Jens Somrenshaw in April was 102.  After being admitted recently for what sounds like GI issues the hospitalist ordered home PT.  This was her first visit with the patient and she was just calling to let us know.  She did not know if he had taken his Cardizem prior to her being there or what dose he is taking.  She is going to see him again tomorrow and let Dr Jens Somrenshaw know if his HR is still elevated.  He did c/o SOB after the walk.  I call the [facility] office and spoke with Deliah Goodyebra Mathis, RN and will forward this to her for follow up.  I have sent staff message to [facility] to obtain hosptial records from [facility] with his recent admission

## 2014-09-24 NOTE — Telephone Encounter (Signed)
New message           Dot LanesKrista had pt walk 50 feet and his heart went up to 136 / is this normal?  Does the battery need to be replaced? This is [name] [name]'s first time meeting the pt.

## 2014-09-24 NOTE — Telephone Encounter (Signed)
Spoke with pt wife, she states the patient was in the hosp because of bleeding diverticulitis. She reports his blood count, bp oxygen, everything got low. He is SOB with little exertion and his heart will race at times. Await records from [facility], wife will call if condition changes or gets worse. Pt wife agreed with this plan.

## 2014-09-30 ENCOUNTER — Ambulatory Visit (INDEPENDENT_AMBULATORY_CARE_PROVIDER_SITE_OTHER): Payer: Medicare Other | Admitting: *Deleted

## 2014-09-30 DIAGNOSIS — I359 Nonrheumatic aortic valve disorder, unspecified: Secondary | ICD-10-CM

## 2014-09-30 DIAGNOSIS — Z7901 Long term (current) use of anticoagulants: Secondary | ICD-10-CM

## 2014-09-30 LAB — POCT INR: INR: 1.8

## 2014-10-15 ENCOUNTER — Encounter: Payer: Self-pay | Admitting: Internal Medicine

## 2014-10-15 ENCOUNTER — Ambulatory Visit (INDEPENDENT_AMBULATORY_CARE_PROVIDER_SITE_OTHER): Payer: Medicare Other | Admitting: Internal Medicine

## 2014-10-15 ENCOUNTER — Ambulatory Visit (INDEPENDENT_AMBULATORY_CARE_PROVIDER_SITE_OTHER): Payer: Medicare Other | Admitting: Pharmacist

## 2014-10-15 VITALS — BP 124/62 | HR 127 | Ht 72.0 in | Wt 193.0 lb

## 2014-10-15 DIAGNOSIS — Z7901 Long term (current) use of anticoagulants: Secondary | ICD-10-CM

## 2014-10-15 DIAGNOSIS — Z45018 Encounter for adjustment and management of other part of cardiac pacemaker: Secondary | ICD-10-CM

## 2014-10-15 DIAGNOSIS — I10 Essential (primary) hypertension: Secondary | ICD-10-CM

## 2014-10-15 DIAGNOSIS — Z950 Presence of cardiac pacemaker: Secondary | ICD-10-CM

## 2014-10-15 DIAGNOSIS — I480 Paroxysmal atrial fibrillation: Secondary | ICD-10-CM

## 2014-10-15 DIAGNOSIS — I359 Nonrheumatic aortic valve disorder, unspecified: Secondary | ICD-10-CM

## 2014-10-15 LAB — MDC_IDC_ENUM_SESS_TYPE_INCLINIC
Battery Voltage: 2.88 V
Brady Statistic AP VP Percent: 0.56 %
Brady Statistic AS VP Percent: 1.27 %
Brady Statistic RA Percent Paced: 25.88 %
Brady Statistic RV Percent Paced: 1.83 %
Lead Channel Impedance Value: 512 Ohm
Lead Channel Pacing Threshold Amplitude: 1 V
Lead Channel Pacing Threshold Pulse Width: 0.4 ms
Lead Channel Sensing Intrinsic Amplitude: 4.9157
Lead Channel Sensing Intrinsic Amplitude: 9.2102
Lead Channel Setting Pacing Amplitude: 2 V
Lead Channel Setting Pacing Amplitude: 2.5 V
Lead Channel Setting Pacing Pulse Width: 0.4 ms
MDC IDC MSMT LEADCHNL RA PACING THRESHOLD PULSEWIDTH: 0.4 ms
MDC IDC MSMT LEADCHNL RV IMPEDANCE VALUE: 448 Ohm
MDC IDC MSMT LEADCHNL RV PACING THRESHOLD AMPLITUDE: 1 V
MDC IDC SESS DTM: 20151021121306
MDC IDC SET LEADCHNL RV SENSING SENSITIVITY: 0.9 mV
MDC IDC SET ZONE DETECTION INTERVAL: 350 ms
MDC IDC SET ZONE DETECTION INTERVAL: 400 ms
MDC IDC STAT BRADY AP VS PERCENT: 25.32 %
MDC IDC STAT BRADY AS VS PERCENT: 72.86 %

## 2014-10-15 LAB — CBC WITH DIFFERENTIAL/PLATELET
BASOS PCT: 0.7 % (ref 0.0–3.0)
Basophils Absolute: 0 10*3/uL (ref 0.0–0.1)
EOS PCT: 0.9 % (ref 0.0–5.0)
Eosinophils Absolute: 0.1 10*3/uL (ref 0.0–0.7)
HCT: 35.3 % — ABNORMAL LOW (ref 39.0–52.0)
Hemoglobin: 11.3 g/dL — ABNORMAL LOW (ref 13.0–17.0)
Lymphocytes Relative: 11.6 % — ABNORMAL LOW (ref 12.0–46.0)
Lymphs Abs: 0.8 10*3/uL (ref 0.7–4.0)
MCHC: 31.9 g/dL (ref 30.0–36.0)
MCV: 80.6 fl (ref 78.0–100.0)
Monocytes Absolute: 0.5 10*3/uL (ref 0.1–1.0)
Monocytes Relative: 7.6 % (ref 3.0–12.0)
NEUTROS PCT: 79.2 % — AB (ref 43.0–77.0)
Neutro Abs: 5.3 10*3/uL (ref 1.4–7.7)
Platelets: 281 10*3/uL (ref 150.0–400.0)
RBC: 4.38 Mil/uL (ref 4.22–5.81)
RDW: 20.1 % — ABNORMAL HIGH (ref 11.5–15.5)
WBC: 6.7 10*3/uL (ref 4.0–10.5)

## 2014-10-15 LAB — POCT INR: INR: 2.7

## 2014-10-15 MED ORDER — AMIODARONE HCL 200 MG PO TABS: 200.0000 mg | ORAL_TABLET | Freq: Every day | ORAL | Status: DC

## 2014-10-15 NOTE — Assessment & Plan Note (Signed)
The patient has had recurrent and increasingly symptomatic atrial fibrillation. We discussed the treatment options in detail. I recommended low-dose amiodarone therapy, in hopes of improving his symptoms. I will plan to see him back in 3 months. He has an appointment with Dr. Jens Somrenshaw. He will require thyroid function and liver function studies.

## 2014-10-15 NOTE — Assessment & Plan Note (Signed)
He is on warfarin with his mechanical aortic valve. Because we have started low-dose amiodarone, I have referred the patient back to the Coumadin clinic for additional evaluation. We will also plan to obtain a CBC.

## 2014-10-15 NOTE — Assessment & Plan Note (Signed)
His Medtronic dual-chamber pacemaker appears to be working normally. He is approaching elective replacement. He will undergo watchful waiting.

## 2014-10-15 NOTE — Patient Instructions (Addendum)
Your physician has recommended you make the following change in your medication:  1) START Amiodarone 200 mg daily  Lab today: CBC  Remote monitoring is used to monitor your pacemaker from home. This monitoring reduces the number of office visits required to check your device to one time per year. It allows us to keep an eye on the functioning of your device to ensure it is working properly. You are scheduled for a device check from home on 11/15/2014 You may send your transmission at any time that day. If you have a wireless device, the transmission will be sent automatically. After your physician reviews your transmission, you will receive a postcard with your next transmission date.  Your physician recommends that you schedule a follow-up appointment in: 3 months with Dr.Taylor

## 2014-10-15 NOTE — Assessment & Plan Note (Signed)
His blood pressure is well controlled. No change in medical therapy at this point.

## 2014-10-15 NOTE — Progress Notes (Signed)
HPI [name] [name] returns today for followup. He is a very pleasant [age] year old man with a history of symptomatic sinus bradycardia secondary to sinus node dysfunction, status post permanent pacemaker insertion, paroxysmal atrial fibrillation, and hypertension. He has a history of aortic insufficiency and is status post aortic valve replacement. He was recently hospitalized with GI bleeding, secondary to gastritis and duodenitis. He also notices shortness of breath and dizziness. At times his heart rate appears to be elevated. No Known Allergies   Current Outpatient Prescriptions  Medication Sig Dispense Refill  . alfuzosin (UROXATRAL) 10 MG 24 hr tablet Take after supper      . diltiazem (CARDIZEM CD) 240 MG 24 hr capsule TAKE 1 CAPSULE BY MOUTH DAILY.  30 capsule  6  . finasteride (PROSCAR) 5 MG tablet Take 5 mg by mouth daily.      [name]. levothyroxine (SYNTHROID, LEVOTHROID) 75 MCG tablet Take 75 mcg by mouth daily before breakfast.      . omeprazole (PRILOSEC) 20 MG capsule Take 20 mg by mouth daily.      . Prenatal Vit-Fe Fumarate-FA (PRENATAL VITAMINS PLUS PO) Take by mouth.      . traMADol (ULTRAM) 50 MG tablet Take 100 mg by mouth 3 (three) times daily.       [name]. warfarin (COUMADIN) 4 MG tablet TAKE AS DIRECTED BY COUMADIN CLINIC DUE TO PT/INR RESULT       No current facility-administered medications for this visit.     Past Medical History  Diagnosis Date  . Elevated PSA   . Aortic insufficiency   . Thoracic aortic aneurysm   . Carpal tunnel syndrome   . SVT (supraventricular tachycardia)   . Amaurosis fugax   . BPH (benign prostatic hypertrophy)   . Hypothyroidism   . Cardiac pacemaker     Medtronic    ROS:   All systems reviewed and negative except as noted in the HPI.   Past Surgical History  Procedure Laterality Date  . Insert / replace / remove pacemaker      Medtronic  . Median sternotomy    . Total knee arthroplasty      right  . Carpal tunnel release      right   . Aortic valve replacement       Family History  Problem Relation Age of Onset  . Hypertension Mother   . Stroke Father   . Dementia Sister     youngest sister  . Heart disease Brother      History   Social History  . Marital Status: Married    Spouse Name: N/A    Number of Children: 3  . Years of Education: N/A   Occupational History  . retired     Visual merchandiserfarmer   Social History Main Topics  . Smoking status: Never Smoker   . Smokeless tobacco: Not on file  . Alcohol Use: No  . Drug Use: No  . Sexual Activity: Not on file   Other Topics Concern  . Not on file   Social History Narrative  . No narrative on file     BP 124/62  Pulse 127  Ht 6' (1.829 m)  Wt 193 lb (87.544 kg)  BMI 26.17 kg/m2  Physical Exam:  Chronically ill appearing elderly man, NAD HEENT: Unremarkable Neck:  No JVD, no thyromegally Lungs:  Clear with no wheezes, rales, or rhonchi. Well-healed pacemaker incision HEART:  IRegular rate rhythm, soft systolic murmur at the base, no rubs, no clicks Abd:  soft, positive bowel sounds, no organomegally, no rebound, no guarding Ext:  2 plus pulses, no edema, no cyanosis, no clubbing Skin:  No rashes no nodules Neuro:  CN II through XII intact, motor grossly intact  DEVICE  Normal device function.  See PaceArt for details.   Assess/Plan:

## 2014-10-17 ENCOUNTER — Encounter (HOSPITAL_COMMUNITY): Payer: Self-pay | Admitting: Emergency Medicine

## 2014-10-17 ENCOUNTER — Telehealth: Payer: Self-pay | Admitting: Cardiology

## 2014-10-17 ENCOUNTER — Emergency Department (HOSPITAL_COMMUNITY): Payer: Medicare Other

## 2014-10-17 ENCOUNTER — Inpatient Hospital Stay (HOSPITAL_COMMUNITY)
Admission: EM | Admit: 2014-10-17 | Discharge: 2014-10-21 | DRG: 309 | Disposition: A | Discharge status: Home Health | Payer: Medicare Other | Attending: Family Medicine | Admitting: Family Medicine

## 2014-10-17 DIAGNOSIS — I129 Hypertensive chronic kidney disease with stage 1 through stage 4 chronic kidney disease, or unspecified chronic kidney disease: Secondary | ICD-10-CM | POA: Diagnosis present

## 2014-10-17 DIAGNOSIS — I471 Supraventricular tachycardia: Secondary | ICD-10-CM | POA: Diagnosis present

## 2014-10-17 DIAGNOSIS — I712 Thoracic aortic aneurysm, without rupture: Secondary | ICD-10-CM | POA: Diagnosis present

## 2014-10-17 DIAGNOSIS — N183 Chronic kidney disease, stage 3 (moderate): Secondary | ICD-10-CM | POA: Diagnosis present

## 2014-10-17 DIAGNOSIS — Z952 Presence of prosthetic heart valve: Secondary | ICD-10-CM | POA: Diagnosis not present

## 2014-10-17 DIAGNOSIS — Z7901 Long term (current) use of anticoagulants: Secondary | ICD-10-CM

## 2014-10-17 DIAGNOSIS — R0602 Shortness of breath: Secondary | ICD-10-CM | POA: Diagnosis not present

## 2014-10-17 DIAGNOSIS — Z96651 Presence of right artificial knee joint: Secondary | ICD-10-CM | POA: Diagnosis present

## 2014-10-17 DIAGNOSIS — W19XXXA Unspecified fall, initial encounter: Secondary | ICD-10-CM

## 2014-10-17 DIAGNOSIS — R739 Hyperglycemia, unspecified: Secondary | ICD-10-CM | POA: Diagnosis present

## 2014-10-17 DIAGNOSIS — I5032 Chronic diastolic (congestive) heart failure: Secondary | ICD-10-CM | POA: Diagnosis present

## 2014-10-17 DIAGNOSIS — R42 Dizziness and giddiness: Secondary | ICD-10-CM | POA: Diagnosis present

## 2014-10-17 DIAGNOSIS — H8110 Benign paroxysmal vertigo, unspecified ear: Secondary | ICD-10-CM

## 2014-10-17 DIAGNOSIS — I509 Heart failure, unspecified: Secondary | ICD-10-CM

## 2014-10-17 DIAGNOSIS — I480 Paroxysmal atrial fibrillation: Secondary | ICD-10-CM | POA: Diagnosis present

## 2014-10-17 DIAGNOSIS — I4891 Unspecified atrial fibrillation: Secondary | ICD-10-CM | POA: Diagnosis present

## 2014-10-17 DIAGNOSIS — Z8249 Family history of ischemic heart disease and other diseases of the circulatory system: Secondary | ICD-10-CM | POA: Diagnosis not present

## 2014-10-17 DIAGNOSIS — I517 Cardiomegaly: Secondary | ICD-10-CM

## 2014-10-17 DIAGNOSIS — E038 Other specified hypothyroidism: Secondary | ICD-10-CM

## 2014-10-17 DIAGNOSIS — N400 Enlarged prostate without lower urinary tract symptoms: Secondary | ICD-10-CM | POA: Diagnosis present

## 2014-10-17 DIAGNOSIS — E039 Hypothyroidism, unspecified: Secondary | ICD-10-CM | POA: Diagnosis present

## 2014-10-17 DIAGNOSIS — Z79899 Other long term (current) drug therapy: Secondary | ICD-10-CM | POA: Diagnosis not present

## 2014-10-17 DIAGNOSIS — Z950 Presence of cardiac pacemaker: Secondary | ICD-10-CM | POA: Diagnosis not present

## 2014-10-17 DIAGNOSIS — I10 Essential (primary) hypertension: Secondary | ICD-10-CM | POA: Diagnosis present

## 2014-10-17 DIAGNOSIS — I4719 Other supraventricular tachycardia: Secondary | ICD-10-CM | POA: Diagnosis present

## 2014-10-17 LAB — BASIC METABOLIC PANEL
Anion gap: 17 — ABNORMAL HIGH (ref 5–15)
BUN: 17 mg/dL (ref 6–23)
CO2: 22 mEq/L (ref 19–32)
Calcium: 9.4 mg/dL (ref 8.4–10.5)
Chloride: 98 mEq/L (ref 96–112)
Creatinine, Ser: 1.46 mg/dL — ABNORMAL HIGH (ref 0.50–1.35)
GFR, EST AFRICAN AMERICAN: 49 mL/min — AB (ref >90)
GFR, EST NON AFRICAN AMERICAN: 42 mL/min — AB (ref >90)
Glucose, Bld: 178 mg/dL — ABNORMAL HIGH (ref 70–99)
Potassium: 4.2 mEq/L (ref 3.7–5.3)
SODIUM: 137 meq/L (ref 137–147)

## 2014-10-17 LAB — URINALYSIS, ROUTINE W REFLEX MICROSCOPIC
Bilirubin Urine: NEGATIVE
Glucose, UA: NEGATIVE mg/dL
HGB URINE DIPSTICK: NEGATIVE
KETONES UR: NEGATIVE mg/dL
LEUKOCYTES UA: NEGATIVE
Nitrite: NEGATIVE
PH: 6.5 (ref 5.0–8.0)
Protein, ur: NEGATIVE mg/dL
Specific Gravity, Urine: 1.018 (ref 1.005–1.030)
Urobilinogen, UA: 0.2 mg/dL (ref 0.0–1.0)

## 2014-10-17 LAB — CBC
HEMATOCRIT: 35.6 % — AB (ref 39.0–52.0)
Hemoglobin: 11.5 g/dL — ABNORMAL LOW (ref 13.0–17.0)
MCH: 25.8 pg — ABNORMAL LOW (ref 26.0–34.0)
MCHC: 32.3 g/dL (ref 30.0–36.0)
MCV: 79.8 fL (ref 78.0–100.0)
Platelets: 263 10*3/uL (ref 150–400)
RBC: 4.46 MIL/uL (ref 4.22–5.81)
RDW: 17.7 % — ABNORMAL HIGH (ref 11.5–15.5)
WBC: 5.7 10*3/uL (ref 4.0–10.5)

## 2014-10-17 LAB — PROTIME-INR
INR: 2.29 — AB (ref 0.00–1.49)
Prothrombin Time: 25.4 seconds — ABNORMAL HIGH (ref 11.6–15.2)

## 2014-10-17 LAB — TROPONIN I: Troponin I: <0.3 ng/mL

## 2014-10-17 LAB — I-STAT TROPONIN, ED: Troponin i, poc: 0.02 ng/mL (ref 0.00–0.08)

## 2014-10-17 LAB — PRO B NATRIURETIC PEPTIDE: Pro B Natriuretic peptide (BNP): 1225 pg/mL — ABNORMAL HIGH (ref 0–450)

## 2014-10-17 MED ORDER — LEVOTHYROXINE SODIUM 75 MCG PO TABS
75.0000 ug | ORAL_TABLET | Freq: Every day | ORAL | Status: DC
  Administered 2014-10-18 – 2014-10-21 (×4): 75 ug via ORAL
  Filled 2014-10-17 (×5): qty 1

## 2014-10-17 MED ORDER — FUROSEMIDE 10 MG/ML IJ SOLN
40.0000 mg | Freq: Once | INTRAMUSCULAR | Status: AC
  Administered 2014-10-17: 40 mg via INTRAVENOUS
  Filled 2014-10-17: qty 4

## 2014-10-17 MED ORDER — FINASTERIDE 5 MG PO TABS
5.0000 mg | ORAL_TABLET | Freq: Every day | ORAL | Status: DC
  Administered 2014-10-18 – 2014-10-21 (×4): 5 mg via ORAL
  Filled 2014-10-17 (×5): qty 1

## 2014-10-17 MED ORDER — SODIUM CHLORIDE 0.9 % IJ SOLN: 3.0000 mL | INTRAMUSCULAR | Status: DC | PRN

## 2014-10-17 MED ORDER — DILTIAZEM HCL ER COATED BEADS 240 MG PO CP24
240.0000 mg | ORAL_CAPSULE | Freq: Every day | ORAL | Status: DC
  Administered 2014-10-18 – 2014-10-19 (×2): 240 mg via ORAL
  Filled 2014-10-17 (×2): qty 1

## 2014-10-17 MED ORDER — WARFARIN SODIUM 4 MG PO TABS
4.0000 mg | ORAL_TABLET | Freq: Once | ORAL | Status: AC
  Administered 2014-10-17: 4 mg via ORAL
  Filled 2014-10-17: qty 1

## 2014-10-17 MED ORDER — ACETAMINOPHEN 650 MG RE SUPP: 650.0000 mg | Freq: Four times a day (QID) | RECTAL | Status: DC | PRN

## 2014-10-17 MED ORDER — ALFUZOSIN HCL ER 10 MG PO TB24
10.0000 mg | ORAL_TABLET | Freq: Every day | ORAL | Status: DC
  Administered 2014-10-18 – 2014-10-21 (×4): 10 mg via ORAL
  Filled 2014-10-17 (×4): qty 1

## 2014-10-17 MED ORDER — SODIUM CHLORIDE 0.9 % IV SOLN: 250.0000 mL | INTRAVENOUS | Status: DC | PRN

## 2014-10-17 MED ORDER — TRAMADOL HCL 50 MG PO TABS: 100.0000 mg | ORAL_TABLET | Freq: Three times a day (TID) | ORAL | Status: DC | PRN

## 2014-10-17 MED ORDER — ACETAMINOPHEN 325 MG PO TABS: 650.0000 mg | ORAL_TABLET | Freq: Four times a day (QID) | ORAL | Status: DC | PRN

## 2014-10-17 MED ORDER — PANTOPRAZOLE SODIUM 40 MG PO TBEC
40.0000 mg | DELAYED_RELEASE_TABLET | Freq: Every day | ORAL | Status: DC
  Administered 2014-10-18 – 2014-10-21 (×4): 40 mg via ORAL
  Filled 2014-10-17 (×4): qty 1

## 2014-10-17 MED ORDER — SODIUM CHLORIDE 0.9 % IJ SOLN
3.0000 mL | Freq: Two times a day (BID) | INTRAMUSCULAR | Status: DC
  Administered 2014-10-18 – 2014-10-21 (×3): 3 mL via INTRAVENOUS

## 2014-10-17 MED ORDER — SODIUM CHLORIDE 0.9 % IJ SOLN
3.0000 mL | Freq: Two times a day (BID) | INTRAMUSCULAR | Status: DC
  Administered 2014-10-17 – 2014-10-21 (×3): 3 mL via INTRAVENOUS

## 2014-10-17 MED ORDER — WARFARIN - PHARMACIST DOSING INPATIENT: Freq: Every day | Status: DC

## 2014-10-17 MED ORDER — SODIUM CHLORIDE 0.9 % IV BOLUS (SEPSIS)
500.0000 mL | Freq: Once | INTRAVENOUS | Status: AC
  Administered 2014-10-17: 500 mL via INTRAVENOUS

## 2014-10-17 MED ORDER — AMIODARONE HCL 200 MG PO TABS
200.0000 mg | ORAL_TABLET | Freq: Every day | ORAL | Status: DC
  Administered 2014-10-18: 200 mg via ORAL
  Filled 2014-10-17: qty 1

## 2014-10-17 NOTE — Telephone Encounter (Signed)
Pt's wife called in stating that the pt is having irregular heart beat, SOB, and he has been feeling weak.Please call  Thanks

## 2014-10-17 NOTE — Telephone Encounter (Signed)
Pt's wife called in stating that the pt has been having an irregular heart beat, SOB , and he has been feeling weak and she would like for him to be seen as soon as possible. Please call

## 2014-10-17 NOTE — ED Notes (Signed)
Transporting patient to new room assignment. 

## 2014-10-17 NOTE — Progress Notes (Signed)
ANTICOAGULATION CONSULT NOTE - Initial Consult  Pharmacy Consult for wafarin Indication: atrial fibrillation and mechanical aortic valve  No Known Allergies  Patient Measurements: Height: 6' (182.9 cm) Weight: 188 lb 8 oz (85.503 kg) IBW/kg (Calculated) : 77.6  Vital Signs: Temp: 97.7 F (36.5 C) (10/23 2109) Temp Source: Oral (10/23 2109) BP: 133/60 mmHg (10/23 2109) Pulse Rate: 105 (10/23 2109)  Labs:  Recent Labs  10/15/14 1207 10/15/14 1227 10/17/14 1448  HGB  --  11.3* 11.5*  HCT  --  35.3* 35.6*  PLT  --  281.0 263  LABPROT  --   --  25.4*  INR 2.7  --  2.29*  CREATININE  --   --  1.46*    Estimated Creatinine Clearance: 41.3 ml/min (by C-G formula based on Cr of 1.46).   Medical History: Past Medical History  Diagnosis Date  . Elevated PSA   . Aortic insufficiency   . Thoracic aortic aneurysm   . Carpal tunnel syndrome   . SVT (supraventricular tachycardia)   . Amaurosis fugax   . BPH (benign prostatic hypertrophy)   . Hypothyroidism   . Cardiac pacemaker     Medtronic    Medications:  Prescriptions prior to admission  Medication Sig Dispense Refill  . alfuzosin (UROXATRAL) 10 MG 24 hr tablet Take 10 mg by mouth daily.       [name]. amiodarone (PACERONE) 200 MG tablet Take 1 tablet (200 mg total) by mouth daily.  30 tablet  3  . diltiazem (CARDIZEM CD) 240 MG 24 hr capsule Take 240 mg by mouth daily.      . finasteride (PROSCAR) 5 MG tablet Take 5 mg by mouth daily.      . Lactobacillus (ACIDOPHILUS PO) Take 1 capsule by mouth 2 (two) times daily.      [name]. levothyroxine (SYNTHROID, LEVOTHROID) 75 MCG tablet Take 75 mcg by mouth daily before breakfast.      . omeprazole (PRILOSEC) 20 MG capsule Take 20 mg by mouth daily.      . Prenatal Vit-Fe Fumarate-FA (PRENATAL VITAMINS PLUS PO) Take by mouth.      . traMADol (ULTRAM) 50 MG tablet Take 100 mg by mouth 3 (three) times daily.       [name]. warfarin (COUMADIN) 4 MG tablet Take 4 mg by mouth daily.          Assessment: 80 year old male on warfarin prior to admission for mechanical aortic valve and atrial fibrillation. INR today is low at 2.29.  Siginificant PMH includes recent GIB 2/2 gastritis/duodenitis.   Home regimen: 4mg  po daily. Last dose 10/16/14 per patient report.   [facility]. Last visit was 10/21 and INR was stable on current dose and level was 2.7.   Goal of Therapy:  INR 2.5-3.5 Monitor platelets by anticoagulation protocol: Yes   Plan:  1. Warfarin 4 mg po x1 tonight.  2. PT/INR in AM.   Link SnufferJessica Nadene Witherspoon, PharmD, BCPS Clinical Pharmacist [phone] 10/17/2014,9:37 PM

## 2014-10-17 NOTE — H&P (Signed)
[facility] History and Physical Service Pager: [phone]  Patient name: [name] [name] Medical record number: [id number] Date of birth: 02/20/1928 Age: 80 y.o. Gender: male  Primary Care Provider: Abigail MiyamotoPERRY,LAWRENCE EDWARD, MD Consultants: none Code Status: full code (discussed with patient and family upon admission)  Chief Complaint: fatigue and dyspnea  Assessment and Plan: [name] KaufmannGattis Alicea is a 80 y.o. male presenting with dyspnea . PMH is significant for atrial fibrillation, s/p pacemaker, aortic valve replacement, BPH, HTN.  # Dyspnea: etiology not fully clear. BNP is elevated at 1200 and patient has known hx of CHF (echo with EF 45-50%, grade 1 diastolic dysfunction in Nov 2014). However, CXR is clear and patient has no marked evidence of fluid overload on exam. No edema or recent weight gain. PE is also a possibility, but has no calf swelling or tenderness and is on chronic coumadin (INR goal 2.5-3.5).  - admit to telemetry - s/p lasix 40mg  IV x 1 in ER - monitor UOP and daily weights - check 2D echo - cycle cardiac enzymes - EKG now and in AM - will consult cardiology in the morning as he is well known to them  # Atrial fibrillation: currently rate controlled - continue home amlodipine, diltiazem - coumadin per pharmacy (note subtherapeutic for INR goal of 2.5-3.5, currently 2.29)  # BPH: continue home finasteride and alfuzosin  # Hypothyroidism: - check TSH - continue home synthroid 75 mcg daily  FEN/GI: cont home PPI, heart healthy diet, saline lock IV Prophylaxis: on coumadin  Disposition: pending cardiology eval & clinical improvement  History of Present Illness: [name] [name] is a 80 y.o. male presenting with dyspnea and fatigue.  Patient is accompanied by his wife and daughter, who also help provide the history. Pt reports that for the last week or so, he has become progressively more short of breath with exertion. He is also  dizzy with standing. Has hx of atrial fibrillation and a pacemaker, last saw his pacemaker physician Dr. Ladona Ridgelaylor two days ago. Was started on low dose amiodarone at that visit. Dyspnea progressively got worse over the last 1-2 days, with more accompanying fatigue, leading him to come to the ER. He denies having a history of heart failure in the past (although note echo from Nov 2014 speaks to the contrary). Denies having chest pain, orthopnea, lower extremity edema, weight gain, paroxysmal nocturnal dyspnea. Weight at baseline is 192 lb, which is his current weight today.   Reports he's been taking his medications as instructed. Eating and drinking well. No problems with voiding or stooling. No abdominal pain, headache, sore throat. Other than the fatigue and dyspnea, feels well.   Review Of Systems: Per HPI. Otherwise 12 point review of systems was performed and was unremarkable.  Patient Active Problem List   Diagnosis Date Noted  . Congestive heart failure 10/17/2014  . Long term current use of anticoagulant therapy 07/22/2014  . Syncope 11/07/2013  . Atrial fibrillation 10/16/2013  . Preop cardiovascular exam 08/12/2013  . abdomen 11/01/2012  . CARDIOMYOPATHY 09/07/2009  . AORTIC VALVE REPLACEMENT, HX OF 09/07/2009  . HYPOTHYROIDISM 09/05/2009  . AMAUROSIS FUGAX 09/05/2009  . MITRAL REGURGITATION, MILD 09/05/2009  . TRICUSPID REGURGITATION, MILD 09/05/2009  . Essential hypertension 09/05/2009  . AORTIC INSUFFICIENCY 09/05/2009  . BRADYCARDIA 09/05/2009  . CONGESTIVE HEART FAILURE UNSPECIFIED 09/05/2009  . VENTRICULAR HYPERTROPHY, LEFT, HX OF 09/05/2009  . THORACIC AORTIC ANEURYSM,HX OF 09/05/2009  . DYSPNEA, HX OF 09/05/2009  . Personal history of unspecified  circulatory disease 09/05/2009  . SUPRAVENTRICULAR TACHYCARDIA, HX OF 09/05/2009  . BENIGN PROSTATIC HYPERTROPHY, HX OF 09/05/2009  . TOTAL KNEE REPLACEMENT, RIGHT, HX OF 09/05/2009  . PACEMAKER-Medtronic 09/05/2009  .  PERCUTANEOUS TRANSLUMINAL CORONARY ANGIOPLASTY, HX OF 09/05/2009  . CARPAL TUNNEL RELEASE, RIGHT, HX OF 09/05/2009   Past Medical History: Past Medical History  Diagnosis Date  . Elevated PSA   . Aortic insufficiency   . Thoracic aortic aneurysm   . Carpal tunnel syndrome   . SVT (supraventricular tachycardia)   . Amaurosis fugax   . BPH (benign prostatic hypertrophy)   . Hypothyroidism   . Cardiac pacemaker     Medtronic   Past Surgical History: Past Surgical History  Procedure Laterality Date  . Insert / replace / remove pacemaker      Medtronic  . Median sternotomy    . Total knee arthroplasty      right  . Carpal tunnel release      right  . Aortic valve replacement     Social History: History  Substance Use Topics  . Smoking status: Never Smoker   . Smokeless tobacco: Not on file  . Alcohol Use: No   Additional social history: denies smoking, alcohol, drug use. Lives at home with wife.  Please also refer to relevant sections of EMR.  Family History: Family History  Problem Relation Age of Onset  . Hypertension Mother   . Stroke Father   . Dementia Sister     youngest sister  . Heart disease Brother    Allergies and Medications: No Known Allergies No current facility-administered medications on file prior to encounter.   Current Outpatient Prescriptions on File Prior to Encounter  Medication Sig Dispense Refill  . alfuzosin (UROXATRAL) 10 MG 24 hr tablet Take 10 mg by mouth daily.       [name] amiodarone (PACERONE) 200 MG tablet Take 1 tablet (200 mg total) by mouth daily.  30 tablet  3  . finasteride (PROSCAR) 5 MG tablet Take 5 mg by mouth daily.      [name] levothyroxine (SYNTHROID, LEVOTHROID) 75 MCG tablet Take 75 mcg by mouth daily before breakfast.      . omeprazole (PRILOSEC) 20 MG capsule Take 20 mg by mouth daily.      . Prenatal Vit-Fe Fumarate-FA (PRENATAL VITAMINS PLUS PO) Take by mouth.      . traMADol (ULTRAM) 50 MG tablet Take 100 mg by mouth 3  (three) times daily.       [name] warfarin (COUMADIN) 4 MG tablet Take 4 mg by mouth daily.         Objective: BP 133/60  Pulse 105  Temp(Src) 97.7 F (36.5 C) (Oral)  Resp 20  Ht 6' (1.829 m)  Wt 188 lb 8 oz (85.503 kg)  BMI 25.56 kg/m2  SpO2 98% Exam: General: NAD, pleasant elderly male sitting up in bed eating dinner HEENT: NCAT, MMM Cardiovascular: irregularly irregular rhythm, no murmurs Respiratory: normal respiratory effort lying at rest, bilateral lung bases with faint crackles, good aeration throughout Abdomen: soft, nontender to palpation, no masses or organomegaly, +BS Extremities: No appreciable lower extremity edema bilaterally, no calf tenderness or erythema Skin: no rashes noted Neuro: grossly nonfocal, speech normal, follows commands, alert and oriented  Labs and Imaging:  CBC  Recent Labs Lab 10/15/14 1227 10/17/14 1448  WBC 6.7 5.7  HGB 11.3* 11.5*  HCT 35.3* 35.6*  PLT 281.0 263     BMET  Recent Labs Lab 10/17/14 1448  NA 137  K 4.2  CL 98  CO2 22  BUN 17  CREATININE 1.46*  GLUCOSE 178*  CALCIUM 9.4       ProBNP 1225 INR 2.29 UA clear istat trop neg  CXR: Stable cardiomegaly with left ventricular prominence. Mediastinal  contours are also within normal limits. Prosthetic aortic valve.  Left subclavian cardiac rhythm maintenance device with leads in  unchanged position overlying the right atrium and right ventricle.  Mild central bronchitic change similar compared to prior. Lingular  atelectasis versus scarring is also unchanged. No pulmonary edema,  effusion, pneumothorax or focal airspace consolidation. No acute  osseous abnormality. Multilevel degenerative spurring throughout the  spine. Bilateral glenohumeral joint osteoarthritis.  IMPRESSION:  Stable chest x-ray without evidence of acute cardiopulmonary  Process.  CT Head: No acute intracranial abnormality.  Latrelle DodrillBrittany J Copelyn Widmer, MD 10/17/2014, 10:22 PM PGY-3, [facility]  Family Medicine FPTS Intern pager: [phone], text pages welcome

## 2014-10-17 NOTE — ED Notes (Signed)
PA at bedside.

## 2014-10-17 NOTE — ED Provider Notes (Signed)
CSN: [id number]     Arrival date & time 10/17/14  1418 History   First MD Initiated Contact with Patient 10/17/14 1508     Chief Complaint  Patient presents with  . Fatigue  . Atrial Fibrillation     (Consider location/radiation/quality/duration/timing/severity/associated sxs/prior Treatment) The history is provided by the patient and medical records.   This is an 80 y.o. M with PMH significant for aortic insufficiency, thoracic aortic aneurysm, hypothyroidism, paroyxsmal AFIB, symptomatic bradycardia s/p pacemaker placement, presenting to the ED for fatigue.  Patient states for the past 2-3 days he has felt his heart "fluttering" and "doing funny things".  States he feels very fatigued, low energy without any "get up and go" to him, states mostly upon standing or with exertional activities.  He also notes some intermittent dizziness, lightheadedness, and SOB, also more so with exertional activities.  He denies any frank chest pain.  No recent fever, cough, sore throat, or other URI symptoms.  Patient was recently hospitalized (discharged on 09/20/14) for diverticultitis with GI bleed-- states no further melena or hematochezia since discharged.  Saw Dr. Ladona Ridgelaylor 2 days ago in cardiology clinic for similar sx-- was started on low dose amiodarone to help with his AFIB.  Patient on daily coumadin for anti-coagulation.  Did have recent fall, denies head injury.  Patient in AFIB with RVR on arrival.  Past Medical History  Diagnosis Date  . Elevated PSA   . Aortic insufficiency   . Thoracic aortic aneurysm   . Carpal tunnel syndrome   . SVT (supraventricular tachycardia)   . Amaurosis fugax   . BPH (benign prostatic hypertrophy)   . Hypothyroidism   . Cardiac pacemaker     Medtronic   Past Surgical History  Procedure Laterality Date  . Insert / replace / remove pacemaker      Medtronic  . Median sternotomy    . Total knee arthroplasty      right  . Carpal tunnel release      right  .  Aortic valve replacement     Family History  Problem Relation Age of Onset  . Hypertension Mother   . Stroke Father   . Dementia Sister     youngest sister  . Heart disease Brother    History  Substance Use Topics  . Smoking status: Never Smoker   . Smokeless tobacco: Not on file  . Alcohol Use: No    Review of Systems  Constitutional: Positive for fatigue.  Cardiovascular: Positive for palpitations.  All other systems reviewed and are negative.     Allergies  Review of patient's allergies indicates no known allergies.  Home Medications   Prior to Admission medications   Medication Sig Start Date End Date Taking? Authorizing Provider  alfuzosin (UROXATRAL) 10 MG 24 hr tablet Take after supper    Historical Provider, MD  amiodarone (PACERONE) 200 MG tablet Take 1 tablet (200 mg total) by mouth daily. 10/15/14   Marinus MawGregg W Taylor, MD  diltiazem (CARDIZEM CD) 240 MG 24 hr capsule TAKE 1 CAPSULE BY MOUTH DAILY.    Lewayne BuntingBrian S Crenshaw, MD  finasteride (PROSCAR) 5 MG tablet Take 5 mg by mouth daily.    Historical Provider, MD  levothyroxine (SYNTHROID, LEVOTHROID) 75 MCG tablet Take 75 mcg by mouth daily before breakfast.    Historical Provider, MD  omeprazole (PRILOSEC) 20 MG capsule Take 20 mg by mouth daily.    Historical Provider, MD  Prenatal Vit-Fe Fumarate-FA (PRENATAL VITAMINS PLUS PO) Take  by mouth.    Historical Provider, MD  traMADol (ULTRAM) 50 MG tablet Take 100 mg by mouth 3 (three) times daily.  04/18/14   Historical Provider, MD  warfarin (COUMADIN) 4 MG tablet TAKE AS DIRECTED BY COUMADIN CLINIC DUE TO PT/INR RESULT    Historical Provider, MD   BP 126/88  Pulse 84  Temp(Src) 97.7 F (36.5 C) (Oral)  Resp 24  Ht 6' (1.829 m)  Wt 192 lb (87.091 kg)  BMI 26.03 kg/m2  SpO2 96%  Physical Exam  Nursing note and vitals reviewed. Constitutional: He is oriented to person, place, and time. He appears well-developed and well-nourished. No distress.  Elderly, appears  fatigued  HENT:  Head: Normocephalic and atraumatic.  Mouth/Throat: Oropharynx is clear and moist.  Dry mucous membranes  Eyes: Conjunctivae and EOM are normal. Pupils are equal, round, and reactive to light.  Neck: Normal range of motion. Neck supple.  Cardiovascular: Normal rate, regular rhythm and normal heart sounds.   Pulmonary/Chest: Effort normal and breath sounds normal. No respiratory distress. He has no wheezes.  Abdominal: Soft. Bowel sounds are normal. There is no tenderness. There is no guarding.  Musculoskeletal: Normal range of motion. He exhibits no edema.  Neurological: He is alert and oriented to person, place, and time.  Skin: Skin is warm and dry. He is not diaphoretic.  Psychiatric: He has a normal mood and affect.    ED Course  Procedures (including critical care time) Labs Review Labs Reviewed  CBC - Abnormal; Notable for the following:    Hemoglobin 11.5 (*)    HCT 35.6 (*)    MCH 25.8 (*)    RDW 17.7 (*)    All other components within normal limits  PROTIME-INR - Abnormal; Notable for the following:    Prothrombin Time 25.4 (*)    INR 2.29 (*)    All other components within normal limits  BASIC METABOLIC PANEL  PRO B NATRIURETIC PEPTIDE  URINALYSIS, ROUTINE W REFLEX MICROSCOPIC  I-STAT TROPOININ, ED    Imaging Review Dg Chest 2 View  10/17/2014   CLINICAL DATA:  81 year old male with 3- 4 day history of lethargy, dizziness and shortness of breath  EXAM: CHEST  2 VIEW  COMPARISON:  Prior chest x-ray 09/17/2014  FINDINGS: Stable cardiomegaly with left ventricular prominence. Mediastinal contours are also within normal limits. Prosthetic aortic valve. Left subclavian cardiac rhythm maintenance device with leads in unchanged position overlying the right atrium and right ventricle. Mild central bronchitic change similar compared to prior. Lingular atelectasis versus scarring is also unchanged. No pulmonary edema, effusion, pneumothorax or focal airspace  consolidation. No acute osseous abnormality. Multilevel degenerative spurring throughout the spine. Bilateral glenohumeral joint osteoarthritis.  IMPRESSION: Stable chest x-ray without evidence of acute cardiopulmonary process.   Electronically Signed   By: Malachy Moan M.D.   On: 10/17/2014 16:23   Ct Head Wo Contrast  10/17/2014   CLINICAL DATA:  Fall.  Not feeling well.  EXAM: CT HEAD WITHOUT CONTRAST  TECHNIQUE: Contiguous axial images were obtained from the base of the skull through the vertex without intravenous contrast.  COMPARISON:  10/23/2013  FINDINGS: Mild chronic microvascular ischemic changes in the periventricular white matter are stable. Small mega cisterna magna again noted, an incidental finding. Negative for intra or extra-axial hemorrhage, mass effect, mass lesion, or evidence of acute cortically based infarction. Atherosclerotic calcification of the vertebral arteries and basilar artery noted. The skull is intact. Visualized paranasal sinuses and mastoid air cells are  clear. Leftward directed spur on the nasal septum.  IMPRESSION: No acute intracranial abnormality.   Electronically Signed   By: Britta MccreedySusan  Turner M.D.   On: 10/17/2014 16:24     EKG Interpretation None      MDM   Final diagnoses:  SOB (shortness of breath)  Fall  Paroxysmal a-fib  Congestive heart failure, unspecified congestive heart failure chronicity, unspecified congestive heart failure type  Long term current use of anticoagulant therapy   80 y.o. M with increasingly generalized weakness, palpitations, and SOB.  On exam, respirations are unlabored and lungs are clear bilaterally.  He does not appear significantly fluid overloaded.  He is non-toxic appearing.  EKG AFIB with RVR, however conversted back to NSR with rate in the 80's by time of evaluation (patient has hx of same).  Lab work with elevated BNP at 1225, consistent with likely new onset CHF which may be worsening his AFIB.  Troponin negative.   Remainder of lab work appears baseline for patient when compared with previous.  Patient given dose of 40mg  IV lasix.  Internal medicine teaching service to admit for further management.  Temp admission orders placed, VS remain stable.  Garlon HatchetLisa M Enid Maultsby, PA-C 10/17/14 2051

## 2014-10-17 NOTE — Telephone Encounter (Signed)
I spoke to the wife of this pt. And she stated her husband was not feeling well and may need to come and see someone, his BP was  87/ 67 . I called Zada Findersebra Mattis RN nurse of Dr. Jens Somrenshaw ; and was informed to have the pt. Go to the ER. Pt. Called and instructed

## 2014-10-17 NOTE — ED Provider Notes (Signed)
Medical screening examination/treatment/procedure(s) were conducted as a shared visit with non-physician practitioner(s) and myself.  I personally evaluated the patient during the encounter.   EKG Interpretation None       Date: 10/17/2014  Rate: 131  Rhythm: atrial fibrillation  QRS Axis: normal  Intervals: normal  ST/T Wave abnormalities: normal  Conduction Disutrbances:none  Narrative Interpretation:   Old EKG Reviewed: unchanged  Patient here with intermittent dizziness, weakness for past 3 weeks. Hospitalized one month ago for internal bleeding from diverticulosis, stated his first week after discharge she is doing well and he has been getting worse over the past 3 weeks. He denies any chest pain, but is dizzy when he gets up to move around and is also having dyspnea on exertion. He has no history of heart failure. He denies any rectal bleeding. He did fall a few days ago but not his head. His EKG here is A. fib with RVR, however he is in sinus rhythm on the monitor with rates in the 80s to 90s in the room. He recently started amiodarone for his A. Fib. We'll check labs, chest x-ray, head CT. BNP elevated, admitted.   Elwin MochaBlair Kyrra Prada, MD 10/18/14 [phone]

## 2014-10-17 NOTE — ED Notes (Signed)
Pt here for feeling tired and feeling like his heart is doing funny things for the past 2-3 days. Was discharged 3 weeks ago for diverticulitis. Pt has a pacemaker, heart valve replacement. Pt is on coumadin.

## 2014-10-18 DIAGNOSIS — H8110 Benign paroxysmal vertigo, unspecified ear: Secondary | ICD-10-CM

## 2014-10-18 DIAGNOSIS — I480 Paroxysmal atrial fibrillation: Secondary | ICD-10-CM

## 2014-10-18 DIAGNOSIS — R0602 Shortness of breath: Secondary | ICD-10-CM

## 2014-10-18 LAB — BASIC METABOLIC PANEL
Anion gap: 14 (ref 5–15)
BUN: 17 mg/dL (ref 6–23)
CO2: 24 meq/L (ref 19–32)
Calcium: 8.5 mg/dL (ref 8.4–10.5)
Chloride: 100 mEq/L (ref 96–112)
Creatinine, Ser: 1.48 mg/dL — ABNORMAL HIGH (ref 0.50–1.35)
GFR calc Af Amer: 48 mL/min — ABNORMAL LOW (ref >90)
GFR calc non Af Amer: 42 mL/min — ABNORMAL LOW (ref >90)
GLUCOSE: 119 mg/dL — AB (ref 70–99)
POTASSIUM: 3.8 meq/L (ref 3.7–5.3)
SODIUM: 138 meq/L (ref 137–147)

## 2014-10-18 LAB — HEPATIC FUNCTION PANEL
ALBUMIN: 2.8 g/dL — AB (ref 3.5–5.2)
ALK PHOS: 68 U/L (ref 39–117)
ALT: 8 U/L (ref 0–53)
AST: 15 U/L (ref 0–37)
BILIRUBIN TOTAL: 0.6 mg/dL (ref 0.3–1.2)
Total Protein: 6.1 g/dL (ref 6.0–8.3)

## 2014-10-18 LAB — PROTIME-INR
INR: 2.58 — AB (ref 0.00–1.49)
PROTHROMBIN TIME: 27.9 s — AB (ref 11.6–15.2)

## 2014-10-18 LAB — TROPONIN I: Troponin I: <0.3 ng/mL (ref <0.30)

## 2014-10-18 LAB — HEMOGLOBIN A1C
Hgb A1c MFr Bld: 5.6 % (ref <5.7)
Mean Plasma Glucose: 114 mg/dL (ref <117)

## 2014-10-18 LAB — TSH: TSH: 3.1 u[IU]/mL (ref 0.350–4.500)

## 2014-10-18 MED ORDER — AMIODARONE LOAD VIA INFUSION
150.0000 mg | Freq: Once | INTRAVENOUS | Status: AC
  Administered 2014-10-18: 150 mg via INTRAVENOUS
  Filled 2014-10-18: qty 83.34

## 2014-10-18 MED ORDER — AMIODARONE HCL IN DEXTROSE 360-4.14 MG/200ML-% IV SOLN
60.0000 mg/h | INTRAVENOUS | Status: AC
  Administered 2014-10-18 (×2): 60 mg/h via INTRAVENOUS
  Filled 2014-10-18 (×2): qty 200

## 2014-10-18 MED ORDER — WARFARIN SODIUM 4 MG PO TABS
4.0000 mg | ORAL_TABLET | Freq: Every day | ORAL | Status: DC
  Administered 2014-10-18 – 2014-10-20 (×3): 4 mg via ORAL
  Filled 2014-10-18 (×4): qty 1

## 2014-10-18 MED ORDER — AMIODARONE HCL IN DEXTROSE 360-4.14 MG/200ML-% IV SOLN
30.0000 mg/h | INTRAVENOUS | Status: DC
  Administered 2014-10-19 – 2014-10-20 (×5): 30 mg/h via INTRAVENOUS
  Filled 2014-10-18 (×12): qty 200

## 2014-10-18 NOTE — Progress Notes (Signed)
Family Medicine Teaching Service Daily Progress Note Intern Pager: [phone]  Patient name: [name] [name] Medical record number: [id number] Date of birth: 03/16/1928 Age: 80 y.o. Gender: male  Primary Care Provider: Abigail MiyamotoPERRY,LAWRENCE EDWARD, MD Consultants: cardiology (will call today) Code Status: full code per discussion with pt and family upon admission  Pt Overview and Major Events to Date:  10/23 admit with dyspnea & fatigue  Assessment and Plan: [name] KaufmannGattis Quinteros is a 80 y.o. male presenting with dyspnea . PMH is significant for atrial fibrillation, s/p pacemaker, aortic valve replacement, BPH, HTN.   # Dyspnea: etiology not fully clear. BNP is elevated at 1200 and patient has known hx of CHF (echo with EF 45-50%, grade 1 diastolic dysfunction in Nov 2014). However, CXR is clear and patient has no marked evidence of fluid overload on exam. No edema or recent weight gain. PE is also a possibility, but has no calf swelling or tenderness and is on chronic coumadin (INR goal 2.5-3.5).  - s/p lasix 40mg  IV x 1 in ER, with 825 cc urine out - monitor UOP and daily weights  - ACS ruled out with multiple negative cardiac enzymes [ ]  check 2D echo  [ ]  will consult cardiology today as he is well known to them  - if no obvious cardiac cause, pursue PE workup  # Vertigo: likely BPPV. Avoid meclizine. Consider vestibular rehab as outpatient.  # Atrial fibrillation: currently rate controlled  - continue home amlodipine, diltiazem  - coumadin per pharmacy (INR goal of 2.5-3.5) - INR therapeutic this AM  # BPH: continue home finasteride and alfuzosin   # Hypothyroidism:  - TSH therapeutic range at 3.1 - continue home synthroid 75 mcg daily   # Hyperglycemia: no documented hx of DM - check A1c  FEN/GI: cont home PPI, heart healthy diet, saline lock IV  Prophylaxis: on coumadin   Disposition: pending cardiology eval & clinical improvement  Subjective:  Feeling somewhat better this morning.  No complaints. Breathing improved. Did have increased urine output overnight.  Objective: Temp:  [97.7 F (36.5 C)-98.2 F (36.8 C)] 98.2 F (36.8 C) (10/24 0525) Pulse Rate:  [65-105] 87 (10/24 0525) Resp:  [17-26] 18 (10/24 0525) BP: (124-179)/(53-88) 136/74 mmHg (10/24 0525) SpO2:  [93 %-100 %] 98 % (10/24 0525) Weight:  [187 lb 13.7 oz (85.21 kg)-192 lb (87.091 kg)] 187 lb 13.7 oz (85.21 kg) (10/24 0525) Physical Exam: General: NAD, sitting up on side of bed Cardiovascular: irregularly irregular rhythm, normal rate, no murmur Respiratory: CTAB, NWOB Abdomen: soft, NTTP, +BS Extremities: No appreciable lower extremity edema bilaterally   Laboratory:  Recent Labs Lab 10/15/14 1227 10/17/14 1448  WBC 6.7 5.7  HGB 11.3* 11.5*  HCT 35.3* 35.6*  PLT 281.0 263    Recent Labs Lab 10/17/14 1448 10/18/14 0311  NA 137 138  K 4.2 3.8  CL 98 100  CO2 22 24  BUN 17 17  CREATININE 1.46* 1.48*  CALCIUM 9.4 8.5  GLUCOSE 178* 119*    Imaging/Diagnostic Tests: CXR:  Stable cardiomegaly with left ventricular prominence. Mediastinal  contours are also within normal limits. Prosthetic aortic valve.  Left subclavian cardiac rhythm maintenance device with leads in  unchanged position overlying the right atrium and right ventricle.  Mild central bronchitic change similar compared to prior. Lingular  atelectasis versus scarring is also unchanged. No pulmonary edema,  effusion, pneumothorax or focal airspace consolidation. No acute  osseous abnormality. Multilevel degenerative spurring throughout the  spine. Bilateral glenohumeral joint osteoarthritis.  IMPRESSION:  Stable chest x-ray without evidence of acute cardiopulmonary  Process.   CT Head:  No acute intracranial abnormality.  Latrelle DodrillBrittany J McIntyre, MD 10/18/2014, 9:32 AM PGY-3, [facility] FPTS Intern pager: [phone], text pages welcome

## 2014-10-18 NOTE — Progress Notes (Signed)
Patient seen and discussed with PA Leron CroakHager, I agree with his documentation. 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   Followed by Dr Ladona Ridgelaylor, last appt 10/15/14 patient mentioned symptomatic afib episodes. Recommendation was for low dose amio therapy, he was started on amio 200mg  qday. Device check at that visit showed normal function.   Echo 10/2013 LVEF 45-50%, grade I diastolic dysfunction. Normal functioning AVR.  Repeat echo is pending.  INR 2.29, Cr 1.46, K 4.2, Hgb 11.5, Plt 263, trop neg x 3, pro-BNP 1225 CXR without acute process. CT head w/o acute process.  EKG SR, LAD   Telemetry review shows irregular narrow complex tachycardia, varying P-wave morphologies, rhythm consistent with MAT. Will stop his oral amio, start IV loading amio for more aggressive control. Continue dilt, continue coumadin for his valve and hx of PAF. F/u echo.   Dominga FerryJ Jaamal Farooqui MD

## 2014-10-18 NOTE — Progress Notes (Signed)
I have seen and examined this patient. I have discussed with Dr Pollie MeyerMcIntyre.  I agree with their findings and plans as documented in their progress note.

## 2014-10-18 NOTE — ED Provider Notes (Signed)
Medical screening examination/treatment/procedure(s) were conducted as a shared visit with non-physician practitioner(s) and myself.  I personally evaluated the patient during the encounter.   EKG Interpretation None      Please see my other note for my further notes.  Elwin MochaBlair Dalyah Pla, MD 10/18/14 [phone]

## 2014-10-18 NOTE — Progress Notes (Signed)
PT Cancellation Note  Patient Details Name: [name] [name] MRN: [id number] DOB: 09/26/1928   Cancelled Treatment:    Reason Eval/Treat Not Completed: Other (comment) (Just started eating dinner.)  Will see pt as soon as possible. 10/18/2014  [place] BingKen Tsuruko Murtha, PT [phone]  (pager)   Ison Wichmann, Eliseo GumKenneth V 10/18/2014, 5:32 PM

## 2014-10-18 NOTE — H&P (Signed)
I have seen and examined this patient. I have discussed with Dr Pollie MeyerMcIntyre.  I agree with their findings and plans as documented in their admission note.  Acute Issues 1. New dyspnea on exertion  - Onset within last two weeks. Short of Breath with walking to Bathroom which is new - Denies cough, wheezing, leg swelling, fever, chest pain - No history of smoking. No history of pulmonary disease.  - (+) increased immobility since recent hospitalizations - On Warfarin with adequate anticoagulation for prosthetic Aortic valve - Echocardiogram (11/20/13): estimated ejection fraction was in the range of 45% to 50% with Grade 1 diastolic dysfunction.  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  Cardiology planning surveillance CTA of chest in November. - Variable Systolic BP measurements at home, 90's to 150's per pt recall.  - Orthostatic change in Heart Rate 64 sitting to 100 standing. No SBP orthostatic changes.  - Recent start of Amiodarone by Dr Ladona Ridgelaylor (Card-EPS) for possible pAF on 10/15/14. Patient's complaint of dyspnea on exertion preceded start of Amiodarone.  At this visit, Medtronic dual Optometristchanger Pacemaker working well.  - Regular rhythm, Valve click, no asymmetric calf circumference.  No leg erythema. - Patient's pro-BNP in ED yesterday = 1225 (no prior levels for comparison in EHR) - CHEST XRAY yesterday without acute changes.  - Hgb = 11.5 g/dl - Pt reports some improvement with Lasix 40 mg IV last night in ED.  - Differential includes : Acute decompensated heart failure, deconditioning, pulmonary embolism, mood disorder related to general medical condition, multifactorial etiology.   2. Vertigo, episodic, transient, associated fall - No associated hearing loss, dysphagia, diplopia, dysarthria, facial weakness.  - Description of True Vertigo with fall while shaving last week.  - Episodic for several years - Pt complaining of  dizziness and dyspnea on exertion on cardiology visit 12/20/14 and 11/07/13 - Head CT in ED yesterday: Only chronic vessel changes in white matter. No evidence of acute intracranial event.    Other active issues 3. Falls secondary to #2, #4, #6 and #1 4. Severe Osteoarthritis of left knee and bilateral shoulders - Significant pain in left knee with standing and ambulation 5. Impaired instrumental Activities of Daily Living  6. Impaired Gait - Pt reports receiving home Physical Therapy and Home Health Nursing after recent hospitalization 7. Terminal Dysmetria with Finger-to-Nose bilaterally 8. [facility] for Gastritis/duodenitis 9. Decreased appetite 10. Orthostatic HR change yesterday 11. Urge Incontinence 12. Cataracts with decreased vision 13. Impaired Balance with standing 14. Thigh muscle strength decreased 15. Memory difficulties by patient's report  Plan - Telemetry for 24 to 48 hours - Consult cardiology to see if any further cardiac work-up warranted and their opinion as to role of Heart failure in patient's acute exertional decline.  If they do not believe that patient's current condition is explainable by active heart failure, then would pursue imaging work-up for Pulmonary Emboli.   -  Pt hesitant to start scheduled acetaminophen for his pain with ambulationwithout consulting with his PCP. Continue Tramadol 100 mg THREE TIMES DAILY. - Consult Physical Therapy for abnormal gait assessment and treatment.  Suspect BPPV as explanation for intermittent transient episodes of true vertigo.  Would avoid meclizine, consider Vestibular Rehab in future for consideration canalith repositioning therapy and balance training.

## 2014-10-18 NOTE — Consult Note (Signed)
Cardiologist: Dennard Nip Reason for Consult:  History Referring Physician:   Zannie Runkle is an 80 y.o. male.  HPI:    The patient is an 80 year old male with history of sinus node dysfunction with bradycardia status post permanent pacemaker, paroxysmal age of fibrillation, hypertension, aortic insufficiency, thoracic aortic aneurysm, SVT hypothyroidism.  The patient is on Coumadin therapy with his mechanical aortic valve The patient was seen by Dr. Lovena Le on 10/15/2014 and was noted to be increasingly symptomatic with atrial fibrillation. He was started on amiodarone therapy. His dual chamber permanent pacemaker is working at the time.  He is approaching elective replacement interval and will be monitored closely.  His last 2-D echocardiogram was 11/20/2013 ejection fraction was 45-50% with normal wall motion and grade 1 diastolic dysfunction. There was mild aortic valve regurgitation. Mild mitral valve regurgitation. No significant change from prior echo.    Patient's INR on 09/02/2014 was 3.4, on 09/30/2014 was 1.8, and on October 21st 2015 it was 2.7.    The patient presents with dyspnea when mildly exerting himself. He denies orthopnea, PND, lower extreme edema. He does not get dizzy with change in position. This is been ongoing for approximately one to 2 weeks.  He does report that he was hospitalized in [place] a month ago for GI bleeding.  He denies nausea, vomiting, fever, chest pain, shortness of breath with resting, cough, congestion, abdominal pain, hematochezia, melena, lower extremity edema.   Past Medical History  Diagnosis Date  . Elevated PSA   . Aortic insufficiency   . Thoracic aortic aneurysm   . Carpal tunnel syndrome   . SVT (supraventricular tachycardia)   . Amaurosis fugax   . BPH (benign prostatic hypertrophy)   . Hypothyroidism   . Cardiac pacemaker     Medtronic    Past Surgical History  Procedure Laterality Date  . Insert / replace / remove  pacemaker      Medtronic  . Median sternotomy    . Total knee arthroplasty      right  . Carpal tunnel release      right  . Aortic valve replacement      Family History  Problem Relation Age of Onset  . Hypertension Mother   . Stroke Father   . Dementia Sister     youngest sister  . Heart disease Brother     Social History:  reports that he has never smoked. He does not have any smokeless tobacco history on file. He reports that he does not drink alcohol or use illicit drugs.  Allergies: No Known Allergies  Medications: Scheduled Meds: . alfuzosin  10 mg Oral Daily  . amiodarone  200 mg Oral Daily  . diltiazem  240 mg Oral Daily  . finasteride  5 mg Oral Daily  . levothyroxine  75 mcg Oral QAC breakfast  . pantoprazole  40 mg Oral Daily  . sodium chloride  3 mL Intravenous Q12H  . sodium chloride  3 mL Intravenous Q12H  . warfarin  4 mg Oral q1800  . Warfarin - Pharmacist Dosing Inpatient   Does not apply q1800   Continuous Infusions:  PRN Meds:.sodium chloride, acetaminophen, acetaminophen, sodium chloride, traMADol   Results for orders placed during the hospital encounter of 10/17/14 (from the past 48 hour(s))  CBC     Status: Abnormal   Collection Time    10/17/14  2:48 PM      Result Value Ref Range   WBC 5.7  4.0 -  10.5 K/uL   RBC 4.46  4.22 - 5.81 MIL/uL   Hemoglobin 11.5 (*) 13.0 - 17.0 g/dL   HCT 35.6 (*) 39.0 - 52.0 %   MCV 79.8  78.0 - 100.0 fL   MCH 25.8 (*) 26.0 - 34.0 pg   MCHC 32.3  30.0 - 36.0 g/dL   RDW 17.7 (*) 11.5 - 15.5 %   Platelets 263  150 - 400 K/uL  BASIC METABOLIC PANEL     Status: Abnormal   Collection Time    10/17/14  2:48 PM      Result Value Ref Range   Sodium 137  137 - 147 mEq/L   Potassium 4.2  3.7 - 5.3 mEq/L   Chloride 98  96 - 112 mEq/L   CO2 22  19 - 32 mEq/L   Glucose, Bld 178 (*) 70 - 99 mg/dL   BUN 17  6 - 23 mg/dL   Creatinine, Ser 1.46 (*) 0.50 - 1.35 mg/dL   Calcium 9.4  8.4 - 10.5 mg/dL   GFR calc non Af  Amer 42 (*) >90 mL/min   GFR calc Af Amer 49 (*) >90 mL/min   Comment: (NOTE)     The eGFR has been calculated using the CKD EPI equation.     This calculation has not been validated in all clinical situations.     eGFR's persistently <90 mL/min signify possible Chronic Kidney     Disease.   Anion gap 17 (*) 5 - 15  PROTIME-INR     Status: Abnormal   Collection Time    10/17/14  2:48 PM      Result Value Ref Range   Prothrombin Time 25.4 (*) 11.6 - 15.2 seconds   INR 2.29 (*) 0.00 - 1.49  I-STAT TROPOININ, ED     Status: None   Collection Time    10/17/14  2:59 PM      Result Value Ref Range   Troponin i, poc 0.02  0.00 - 0.08 ng/mL   Comment 3            Comment: Due to the release kinetics of cTnI,     a negative result within the first hours     of the onset of symptoms does not rule out     myocardial infarction with certainty.     If myocardial infarction is still suspected,     repeat the test at appropriate intervals.  PRO B NATRIURETIC PEPTIDE     Status: Abnormal   Collection Time    10/17/14  3:39 PM      Result Value Ref Range   Pro B Natriuretic peptide (BNP) 1225.0 (*) 0 - 450 pg/mL  URINALYSIS, ROUTINE W REFLEX MICROSCOPIC     Status: None   Collection Time    10/17/14  5:36 PM      Result Value Ref Range   Color, Urine YELLOW  YELLOW   APPearance CLEAR  CLEAR   Specific Gravity, Urine 1.018  1.005 - 1.030   pH 6.5  5.0 - 8.0   Glucose, UA NEGATIVE  NEGATIVE mg/dL   Hgb urine dipstick NEGATIVE  NEGATIVE   Bilirubin Urine NEGATIVE  NEGATIVE   Ketones, ur NEGATIVE  NEGATIVE mg/dL   Protein, ur NEGATIVE  NEGATIVE mg/dL   Urobilinogen, UA 0.2  0.0 - 1.0 mg/dL   Nitrite NEGATIVE  NEGATIVE   Leukocytes, UA NEGATIVE  NEGATIVE   Comment: MICROSCOPIC NOT DONE ON URINES WITH NEGATIVE PROTEIN,  BLOOD, LEUKOCYTES, NITRITE, OR GLUCOSE <1000 mg/dL.  TROPONIN I     Status: None   Collection Time    10/17/14 10:45 PM      Result Value Ref Range   Troponin I <0.30   <0.30 ng/mL   Comment:            Due to the release kinetics of cTnI,     a negative result within the first hours     of the onset of symptoms does not rule out     myocardial infarction with certainty.     If myocardial infarction is still suspected,     repeat the test at appropriate intervals.  BASIC METABOLIC PANEL     Status: Abnormal   Collection Time    10/18/14  3:11 AM      Result Value Ref Range   Sodium 138  137 - 147 mEq/L   Potassium 3.8  3.7 - 5.3 mEq/L   Chloride 100  96 - 112 mEq/L   CO2 24  19 - 32 mEq/L   Glucose, Bld 119 (*) 70 - 99 mg/dL   BUN 17  6 - 23 mg/dL   Creatinine, Ser 1.48 (*) 0.50 - 1.35 mg/dL   Calcium 8.5  8.4 - 10.5 mg/dL   GFR calc non Af Amer 42 (*) >90 mL/min   GFR calc Af Amer 48 (*) >90 mL/min   Comment: (NOTE)     The eGFR has been calculated using the CKD EPI equation.     This calculation has not been validated in all clinical situations.     eGFR's persistently <90 mL/min signify possible Chronic Kidney     Disease.   Anion gap 14  5 - 15  TROPONIN I     Status: None   Collection Time    10/18/14  3:11 AM      Result Value Ref Range   Troponin I <0.30  <0.30 ng/mL   Comment:            Due to the release kinetics of cTnI,     a negative result within the first hours     of the onset of symptoms does not rule out     myocardial infarction with certainty.     If myocardial infarction is still suspected,     repeat the test at appropriate intervals.  TSH     Status: None   Collection Time    10/18/14  3:11 AM      Result Value Ref Range   TSH 3.100  0.350 - 4.500 uIU/mL  PROTIME-INR     Status: Abnormal   Collection Time    10/18/14  3:11 AM      Result Value Ref Range   Prothrombin Time 27.9 (*) 11.6 - 15.2 seconds   INR 2.58 (*) 0.00 - 1.49  TROPONIN I     Status: None   Collection Time    10/18/14 10:15 AM      Result Value Ref Range   Troponin I <0.30  <0.30 ng/mL   Comment:            Due to the release kinetics  of cTnI,     a negative result within the first hours     of the onset of symptoms does not rule out     myocardial infarction with certainty.     If myocardial infarction is still suspected,     repeat the test  at appropriate intervals.    Dg Chest 2 View  10/17/2014   CLINICAL DATA:  80 year old male with 3- 4 day history of lethargy, dizziness and shortness of breath  EXAM: CHEST  2 VIEW  COMPARISON:  Prior chest x-ray 09/17/2014  FINDINGS: Stable cardiomegaly with left ventricular prominence. Mediastinal contours are also within normal limits. Prosthetic aortic valve. Left subclavian cardiac rhythm maintenance device with leads in unchanged position overlying the right atrium and right ventricle. Mild central bronchitic change similar compared to prior. Lingular atelectasis versus scarring is also unchanged. No pulmonary edema, effusion, pneumothorax or focal airspace consolidation. No acute osseous abnormality. Multilevel degenerative spurring throughout the spine. Bilateral glenohumeral joint osteoarthritis.  IMPRESSION: Stable chest x-ray without evidence of acute cardiopulmonary process.   Electronically Signed   By: Jacqulynn Cadet M.D.   On: 10/17/2014 16:23   Ct Head Wo Contrast  10/17/2014   CLINICAL DATA:  Fall.  Not feeling well.  EXAM: CT HEAD WITHOUT CONTRAST  TECHNIQUE: Contiguous axial images were obtained from the base of the skull through the vertex without intravenous contrast.  COMPARISON:  10/23/2013  FINDINGS: Mild chronic microvascular ischemic changes in the periventricular white matter are stable. Small mega cisterna magna again noted, an incidental finding. Negative for intra or extra-axial hemorrhage, mass effect, mass lesion, or evidence of acute cortically based infarction. Atherosclerotic calcification of the vertebral arteries and basilar artery noted. The skull is intact. Visualized paranasal sinuses and mastoid air cells are clear. Leftward directed spur on the  nasal septum.  IMPRESSION: No acute intracranial abnormality.   Electronically Signed   By: Curlene Dolphin M.D.   On: 10/17/2014 16:24    Review of Systems  Constitutional: Negative for fever and diaphoresis.  HENT: Negative for congestion and sore throat.   Respiratory: Positive for shortness of breath (with exertion). Negative for cough and wheezing.   Cardiovascular: Negative for chest pain, orthopnea, leg swelling and PND.  Gastrointestinal: Positive for abdominal pain (mild suprapubic pain, chronic). Negative for nausea, vomiting, blood in stool and melena.  Genitourinary: Negative for hematuria.  Musculoskeletal: Positive for joint pain (knee). Negative for myalgias.  Neurological: Positive for dizziness.  All other systems reviewed and are negative.  Blood pressure 111/48, pulse 96, temperature 98.3 F (36.8 C), temperature source Oral, resp. rate 20, height 6' (1.829 m), weight 187 lb 13.7 oz (85.21 kg), SpO2 96.00%. Physical Exam  Nursing note and vitals reviewed. Constitutional: He is oriented to person, place, and time. He appears well-developed and well-nourished. No distress.  HENT:  Head: Normocephalic and atraumatic.  Eyes: EOM are normal. Pupils are equal, round, and reactive to light. No scleral icterus.  Neck: Normal range of motion. Neck supple. No JVD present.  Cardiovascular: Normal rate, S1 normal and S2 normal.  An irregularly irregular rhythm present.  No murmur heard. Pulses:      Radial pulses are 2+ on the right side, and 2+ on the left side.       Dorsalis pedis pulses are 2+ on the right side, and 2+ on the left side.  No carotid bruit  Respiratory: Effort normal and breath sounds normal. He has no wheezes. He has no rales.  GI: Soft. Bowel sounds are normal. He exhibits no distension. There is tenderness (mildly tender suprapubic region).  Musculoskeletal: He exhibits no edema.  Lymphadenopathy:    He has no cervical adenopathy.  Neurological: He is  alert and oriented to person, place, and time. No cranial nerve deficit. He  exhibits normal muscle tone.   arm strength 3/5 and equal  Skin: Skin is warm and dry.  Psychiatric: He has a normal mood and affect.    Assessment/Plan: Active Problems:    Paroxysmal atrial fibrillation Patient is having intermittent episodes of tachycardia which could be atrophic fibrillation or MAT.  We'll continue loading amiodarone with IV instead of by mouth.  2-D echocardiogram is just been completed we will review and make further recommendations.    Chronic Congestive heart failure Patient's BNP is mildly elevated however, I do not think he's in acute heart failure. He has no edema or JVD and chest x-ray is clear.   Status post mechanical aortic valve  On Coumadin.  INR is therapeutic however, on October 6 he was subtherapeutic at 1.8   Chronic Coumadin     Lisanne Ponce, PAC 10/18/2014, 2:32 PM

## 2014-10-18 NOTE — Progress Notes (Signed)
  Echocardiogram 2D Echocardiogram has been performed.  Georgian CoWILLIAMS, Violeta Lecount 10/18/2014, 3:08 PM

## 2014-10-18 NOTE — Progress Notes (Addendum)
ANTICOAGULATION CONSULT NOTE - Initial Consult  Pharmacy Consult for wafarin Indication: atrial fibrillation and mechanical aortic valve  No Known Allergies  Patient Measurements: Height: 6' (182.9 cm) Weight: 187 lb 13.7 oz (85.21 kg) IBW/kg (Calculated) : 77.6  Vital Signs: Temp: 98 F (36.7 C) (10/24 0959) Temp Source: Oral (10/24 0959) BP: 127/63 mmHg (10/24 0959) Pulse Rate: 97 (10/24 0959)  Labs:  Recent Labs  10/15/14 1207 10/15/14 1227 10/17/14 1448 10/17/14 2245 10/18/14 0311  HGB  --  11.3* 11.5*  --   --   HCT  --  35.3* 35.6*  --   --   PLT  --  281.0 263  --   --   LABPROT  --   --  25.4*  --  27.9*  INR 2.7  --  2.29*  --  2.58*  CREATININE  --   --  1.46*  --  1.48*  TROPONINI  --   --   --  <0.30 <0.30    Estimated Creatinine Clearance: 40.8 ml/min (by C-G formula based on Cr of 1.48).   Medical History: Past Medical History  Diagnosis Date  . Elevated PSA   . Aortic insufficiency   . Thoracic aortic aneurysm   . Carpal tunnel syndrome   . SVT (supraventricular tachycardia)   . Amaurosis fugax   . BPH (benign prostatic hypertrophy)   . Hypothyroidism   . Cardiac pacemaker     Medtronic    Medications:  Prescriptions prior to admission  Medication Sig Dispense Refill  . alfuzosin (UROXATRAL) 10 MG 24 hr tablet Take 10 mg by mouth daily.       [name]. amiodarone (PACERONE) 200 MG tablet Take 1 tablet (200 mg total) by mouth daily.  30 tablet  3  . diltiazem (CARDIZEM CD) 240 MG 24 hr capsule Take 240 mg by mouth daily.      . finasteride (PROSCAR) 5 MG tablet Take 5 mg by mouth daily.      . Lactobacillus (ACIDOPHILUS PO) Take 1 capsule by mouth 2 (two) times daily.      [name]. levothyroxine (SYNTHROID, LEVOTHROID) 75 MCG tablet Take 75 mcg by mouth daily before breakfast.      . omeprazole (PRILOSEC) 20 MG capsule Take 20 mg by mouth daily.      . Prenatal Vit-Fe Fumarate-FA (PRENATAL VITAMINS PLUS PO) Take by mouth.      . traMADol (ULTRAM) 50 MG  tablet Take 100 mg by mouth 3 (three) times daily.       [name]. warfarin (COUMADIN) 4 MG tablet Take 4 mg by mouth daily.         Assessment: 80 year old male on warfarin prior to admission for mechanical aortic valve and atrial fibrillation. INR is therapeutic today at 2.58.  Siginificant PMH includes recent GIB 2/2 gastritis/duodenitis.   Home regimen: 4mg  po daily. Last dose 10/16/14 per patient report.  [facility] Last visit was 10/21 and INR was stable on current dose and level was 2.7.   Goal of Therapy:  INR 2.5-3.5 Monitor platelets by anticoagulation protocol: Yes   Plan:   Warfarin 4 mg po qday Daily INR for now   Ulyses SouthwardMinh Pham, PharmD Pager: [phone] 10/18/2014 11:05 AM

## 2014-10-19 DIAGNOSIS — I4891 Unspecified atrial fibrillation: Secondary | ICD-10-CM

## 2014-10-19 DIAGNOSIS — I471 Supraventricular tachycardia: Principal | ICD-10-CM

## 2014-10-19 LAB — PROTIME-INR
INR: 2.51 — AB (ref 0.00–1.49)
Prothrombin Time: 27.3 seconds — ABNORMAL HIGH (ref 11.6–15.2)

## 2014-10-19 LAB — BASIC METABOLIC PANEL
Anion gap: 14 (ref 5–15)
BUN: 19 mg/dL (ref 6–23)
CHLORIDE: 101 meq/L (ref 96–112)
CO2: 22 meq/L (ref 19–32)
Calcium: 8.8 mg/dL (ref 8.4–10.5)
Creatinine, Ser: 1.46 mg/dL — ABNORMAL HIGH (ref 0.50–1.35)
GFR calc Af Amer: 49 mL/min — ABNORMAL LOW (ref >90)
GFR calc non Af Amer: 42 mL/min — ABNORMAL LOW (ref >90)
GLUCOSE: 101 mg/dL — AB (ref 70–99)
POTASSIUM: 4.1 meq/L (ref 3.7–5.3)
SODIUM: 137 meq/L (ref 137–147)

## 2014-10-19 LAB — HEMOGLOBIN A1C
Hgb A1c MFr Bld: 5.7 % — ABNORMAL HIGH (ref <5.7)
Mean Plasma Glucose: 117 mg/dL — ABNORMAL HIGH (ref <117)

## 2014-10-19 LAB — MAGNESIUM: MAGNESIUM: 1.7 mg/dL (ref 1.5–2.5)

## 2014-10-19 MED ORDER — DILTIAZEM HCL ER COATED BEADS 300 MG PO CP24
300.0000 mg | ORAL_CAPSULE | Freq: Every day | ORAL | Status: DC
  Administered 2014-10-20 – 2014-10-21 (×2): 300 mg via ORAL
  Filled 2014-10-19 (×2): qty 1

## 2014-10-19 MED ORDER — DILTIAZEM HCL ER 60 MG PO CP12
60.0000 mg | ORAL_CAPSULE | Freq: Once | ORAL | Status: AC
  Administered 2014-10-19: 60 mg via ORAL
  Filled 2014-10-19 (×2): qty 1

## 2014-10-19 NOTE — Progress Notes (Signed)
ANTICOAGULATION CONSULT NOTE - Initial Consult  Pharmacy Consult for wafarin Indication: atrial fibrillation and mechanical aortic valve  No Known Allergies  Patient Measurements: Height: 6' (182.9 cm) Weight: 186 lb 1.1 oz (84.4 kg) IBW/kg (Calculated) : 77.6  Vital Signs: Temp: 98.6 F (37 C) (10/25 0515) Temp Source: Oral (10/25 0515) BP: 123/56 mmHg (10/25 1001) Pulse Rate: 73 (10/25 1001)  Labs:  Recent Labs  10/17/14 1448 10/17/14 2245 10/18/14 0311 10/18/14 1015 10/19/14 0312  HGB 11.5*  --   --   --   --   HCT 35.6*  --   --   --   --   PLT 263  --   --   --   --   LABPROT 25.4*  --  27.9*  --  27.3*  INR 2.29*  --  2.58*  --  2.51*  CREATININE 1.46*  --  1.48*  --  1.46*  TROPONINI  --  <0.30 <0.30 <0.30  --     Estimated Creatinine Clearance: 41.3 ml/min (by C-G formula based on Cr of 1.46).   Medical History: Past Medical History  Diagnosis Date  . Elevated PSA   . Aortic insufficiency   . Thoracic aortic aneurysm   . Carpal tunnel syndrome   . SVT (supraventricular tachycardia)   . Amaurosis fugax   . BPH (benign prostatic hypertrophy)   . Hypothyroidism   . Cardiac pacemaker     Medtronic    Medications:  Prescriptions prior to admission  Medication Sig Dispense Refill  . alfuzosin (UROXATRAL) 10 MG 24 hr tablet Take 10 mg by mouth daily.       [name]. amiodarone (PACERONE) 200 MG tablet Take 1 tablet (200 mg total) by mouth daily.  30 tablet  3  . diltiazem (CARDIZEM CD) 240 MG 24 hr capsule Take 240 mg by mouth daily.      . finasteride (PROSCAR) 5 MG tablet Take 5 mg by mouth daily.      . Lactobacillus (ACIDOPHILUS PO) Take 1 capsule by mouth 2 (two) times daily.      [name]. levothyroxine (SYNTHROID, LEVOTHROID) 75 MCG tablet Take 75 mcg by mouth daily before breakfast.      . omeprazole (PRILOSEC) 20 MG capsule Take 20 mg by mouth daily.      . Prenatal Vit-Fe Fumarate-FA (PRENATAL VITAMINS PLUS PO) Take by mouth.      . traMADol (ULTRAM) 50 MG  tablet Take 100 mg by mouth 3 (three) times daily.       [name]. warfarin (COUMADIN) 4 MG tablet Take 4 mg by mouth daily.         Assessment: [age] year old male on warfarin prior to admission for mechanical aortic valve and atrial fibrillation. INR is therapeutic today at 2.51.  Siginificant PMH includes recent GIB 2/2 gastritis/duodenitis.   Home regimen: 4mg  po daily. Last dose 10/16/14 per patient report.  [facility]. Last visit was 10/21 and INR was stable on current dose and level was 2.7.   Goal of Therapy:  INR 2.5-3.5 Monitor platelets by anticoagulation protocol: Yes   Plan:   Warfarin 4 mg po qday Will change INR to MWF if stable in AM  Ulyses SouthwardMinh Laporsche Hoeger, PharmD Pager: [phone] 10/19/2014 11:34 AM

## 2014-10-19 NOTE — Progress Notes (Signed)
I have seen and examined this patient. I have discussed with Dr Adriana Simasook.  I agree with their findings and plans as documented in their progress note.  Dyspnea on exertion's working explanation is exertional tachycardia, primarily Multifocal Atrial Tachycardia. Cardiology is loading patient with Amiodarone and increasing his diltiazem oral dose in setting of patient with permanent pacemaker with goal of controlling exertional tachycardia-related dyspnea.

## 2014-10-19 NOTE — Progress Notes (Signed)
Patient ID: [name] [name], male   DOB: 03/21/1928, 80 y.o.   MRN: [id number]    Subjective:    Still with some SOB this morning  Objective:   Temp:  [97.8 F (36.6 C)-98.6 F (37 C)] 97.8 F (36.6 C) (10/25 1001) Pulse Rate:  [67-118] 73 (10/25 1001) Resp:  [20-28] 20 (10/25 1001) BP: (111-128)/(48-70) 123/56 mmHg (10/25 1001) SpO2:  [95 %-98 %] 96 % (10/25 1001) Weight:  [186 lb 1.1 oz (84.4 kg)] 186 lb 1.1 oz (84.4 kg) (10/25 0515) Last BM Date: 10/18/14  Filed Weights   10/17/14 2056 10/18/14 0525 10/19/14 0515  Weight: 188 lb 8 oz (85.503 kg) 187 lb 13.7 oz (85.21 kg) 186 lb 1.1 oz (84.4 kg)    Intake/Output Summary (Last 24 hours) at 10/19/14 1006 Last data filed at 10/19/14 0833  Gross per 24 hour  Intake    843 ml  Output   1152 ml  Net   -309 ml    Telemetry: MAT, variable rates.   Exam:  General: NAD  Resp: CTAB  Cardiac: irreg, mechanical S2, no JVD  WN:[id number]:abdomen soft, NT, ND  MSK: no LE edema  Neuro: no focal deficits  Psych: appropriate affect  Lab Results:  Basic Metabolic Panel:  Recent Labs Lab 10/17/14 1448 10/18/14 0311 10/19/14 0312  NA 137 138 137  K 4.2 3.8 4.1  CL 98 100 101  CO2 22 24 22   GLUCOSE 178* 119* 101*  BUN 17 17 19   CREATININE 1.46* 1.48* 1.46*  CALCIUM 9.4 8.5 8.8    Liver Function Tests:  Recent Labs Lab 10/18/14 1642  AST 15  ALT 8  ALKPHOS 68  BILITOT 0.6  PROT 6.1  ALBUMIN 2.8*    CBC:  Recent Labs Lab 10/15/14 1227 10/17/14 1448  WBC 6.7 5.7  HGB 11.3* 11.5*  HCT 35.3* 35.6*  MCV 80.6 79.8  PLT 281.0 263    Cardiac Enzymes:  Recent Labs Lab 10/17/14 2245 10/18/14 0311 10/18/14 1015  TROPONINI <0.30 <0.30 <0.30    BNP:  Recent Labs  10/17/14 1539  PROBNP 1225.0*    Coagulation:  Recent Labs Lab 10/17/14 1448 10/18/14 0311 10/19/14 0312  INR 2.29* 2.58* 2.51*    ECG:   Medications:   Scheduled Medications: . alfuzosin  10 mg Oral Daily  . diltiazem  240 mg  Oral Daily  . finasteride  5 mg Oral Daily  . levothyroxine  75 mcg Oral QAC breakfast  . pantoprazole  40 mg Oral Daily  . sodium chloride  3 mL Intravenous Q12H  . sodium chloride  3 mL Intravenous Q12H  . warfarin  4 mg Oral q1800  . Warfarin - Pharmacist Dosing Inpatient   Does not apply q1800     Infusions: . amiodarone 30 mg/hr (10/19/14 0150)     PRN Medications:  sodium chloride, acetaminophen, acetaminophen, sodium chloride, traMADol   10/18/14 Echo Study Conclusions  - Left ventricle: The cavity size was normal. There was mild concentric hypertrophy. Systolic function was normal. The estimated ejection fraction was in the range of 50% to 55%. Wall motion was normal; there were no regional wall motion abnormalities. Doppler parameters are consistent with abnormal left ventricular relaxation (grade 1 diastolic dysfunction). - Ventricular septum: Septal motion showed abnormal function and dyssynergy. These changes are consistent with right ventricular pacing. - Aortic valve: A mechanical prosthesis was present. There was mild regurgitation. Valve area (VTI): 2.08 cm^2. Valve area (Vmax): 1.82 cm^2. Valve area (Vmean): 1.96  cm^2. - Left atrium: The atrium was moderately dilated. - Right atrium: The atrium was mildly dilated.     Assessment/Plan    1. Atrial arrythmias: PAF, MAT - Followed by Dr Ladona Ridgelaylor, last appt 10/15/14 patient mentioned symptomatic afib episodes. Recommendation was for low dose amio therapy, he was started on amio 200mg  qday - admitted with fatigue and SOB, tele with episodes of MAT with rapid rates, likely the cause of his fatigue and SOB. Started on IV amio yesterday. TSH 3.1, normal liver panel.  - continued on dilt, continued on coumadin for his PAF - rates remained elevated overnight, this AM currently 80-90s, seem to be increased with activity - will increase his dilt to 300mg  daily, continue amio IV load. He has already received his  240mg  of dilt CD this AM, will give additional 60mg  of dilt SR, change order for tomorrow to dilt cd 300mg  daily.   2. Bradycardia with permanent pacemaker - normal function on last device check 10/15/14.   3. Aortic root aneurysm with previous Bental/AVR - echo yesterday shows mild AI, otherwise normal functioning AVR. LVEF 50-55%.  4. SOB - likely related to symptomatic arrythmia. CXR and exam without signfiicant evidence of volume overload, echo shows maintained LV systolic function and normal functioning AVR.        Dina RichJonathan Jawuan Robb, M.D.

## 2014-10-19 NOTE — Evaluation (Addendum)
Occupational Therapy Evaluation Patient Details Name: [name] [name] MRN: [id number] DOB: 01/05/1928 Today's Date: 10/19/2014    History of Present Illness Patient is an 80 yo male admitted 10/17/14 with exertional dyspnea, lethargy, and dizziness x several weeks pta.  Patient with Afib and MAT (multifactoral atrial tachy).  PMH:  Afib, pacemaker, AV replacement, HTN, CHF, cardiomyopathy, ?vertigo.   Clinical Impression   Pt admitted with above. Pt requiring assist with ADLs, PTA. Feel pt will benefit from acute OT to increase independence, activity tolerance, and balance prior to d/c.     Follow Up Recommendations  No OT follow up;Supervision/Assistance - 24 hour    Equipment Recommendations  None recommended by OT    Recommendations for Other Services       Precautions / Restrictions Precautions Precautions: Fall Restrictions Weight Bearing Restrictions: No      Mobility Bed Mobility Overal bed mobility: Modified Independent                Transfers Overall transfer level: Needs assistance   Transfers: Sit to/from Stand Sit to Stand: Min guard              Balance                                            ADL Overall ADL's : Needs assistance/impaired     Grooming: Oral care;Standing;Set up;Supervision/safety               Lower Body Dressing: Min guard;Sit to/from stand   Toilet Transfer: Minimal assistance;Ambulation (cane/bed)           Functional mobility during ADLs: Min guard;Minimal assistance;Cane General ADL Comments: Educated on safety tips (safe shoewear, sitting for LB ADLs, rugs). Discussed use of long handled sponge to assist with washing back. Discussed different options for UB clothing that may be easier to manage due to limited ROM. Discussed what to hold onto when getting in shower and recommended spouse be with him for shower transfer. Educated on deep breathing technique as pt out of breath. Educated  on energy conservation technique.     Vision                     Perception     Praxis      Pertinent Vitals/Pain Pain Assessment: No/denies pain     Hand Dominance     Extremity/Trunk Assessment Upper Extremity Assessment Upper Extremity Assessment: RUE deficits/detail;LUE deficits/detail RUE Deficits / Details: less than 90 degrees AROM shoulder flexion LUE Deficits / Details: less thank 90 degrees AROM shoulder flexion   Lower Extremity Assessment Lower Extremity Assessment: Defer to PT evaluation       Communication Communication Communication: No difficulties   Cognition Arousal/Alertness: Awake/alert Behavior During Therapy: WFL for tasks assessed/performed Overall Cognitive Status: Within Functional Limits for tasks assessed                     General Comments       Exercises       Shoulder Instructions      Home Living Family/patient expects to be discharged to:: Private residence Living Arrangements: Spouse/significant other Available Help at Discharge: Family;Available 24 hours/day Type of Home: House Home Access: Stairs to enter Entergy CorporationEntrance Stairs-Number of Steps: 2 Entrance Stairs-Rails: None Home Layout: One level     Bathroom Shower/Tub: Walk-in shower  Bathroom Toilet: Standard     Home Equipment: Environmental consultantWalker - 2 wheels;Cane - single point;Shower seat;Toilet riser;Bedside commode          Prior Functioning/Environment Level of Independence: Needs assistance  Gait / Transfers Assistance Needed: Uses cane or RW, depending on how he feels. ADL's / Homemaking Assistance Needed: assist with bathing/dressing at times        OT Diagnosis: Generalized weakness   OT Problem List: Decreased activity tolerance;Decreased range of motion;Decreased strength;Impaired balance (sitting and/or standing);Decreased knowledge of use of DME or AE;Decreased knowledge of precautions;Impaired UE functional use   OT Treatment/Interventions:  Self-care/ADL training;DME and/or AE instruction;Therapeutic activities;Patient/family education;Balance training    OT Goals(Current goals can be found in the care plan section) Acute Rehab OT Goals Patient Stated Goal: not stated OT Goal Formulation: With patient Time For Goal Achievement: 10/26/14 Potential to Achieve Goals: Good ADL Goals Pt Will Perform Lower Body Dressing: with modified independence;sit to/from stand Pt Will Transfer to Toilet: with modified independence;ambulating  OT Frequency: Min 2X/week   Barriers to D/C:            Co-evaluation              End of Session Equipment Utilized During Treatment: Gait belt;Other (comment) (cane)  Activity Tolerance: Patient tolerated treatment well Patient left: in bed;with call bell/phone within reach;with bed alarm set   Time: 1651-1716 OT Time Calculation (min): 25 min Charges:  OT General Charges $OT Visit: 1 Procedure OT Evaluation $Initial OT Evaluation Tier I: 1 Procedure OT Treatments $Self Care/Home Management : 8-22 mins G-CodesEarlie Raveling:    Harvey Lingo L OTR/L [phone] 10/19/2014, 5:53 PM

## 2014-10-19 NOTE — Evaluation (Signed)
Physical Therapy Evaluation Patient Details Name: [name] [name] Menges MRN: [id number] DOB: 11/11/1928 Today's Date: 10/19/2014   History of Present Illness  Patient is an 80 yo male admitted 10/17/14 with exertional dyspnea, lethargy, and dizziness x several weeks pta.  Patient with Afib and MAT (multifactoral atrial tachy).  PMH:  Afib, pacemaker, AV replacement, HTN, CHF, cardiomyopathy, ?vertigo.  Clinical Impression  Patient presents with problems listed below.  Will benefit from acute PT to maximize independence prior to return home with wife.  Recommend patient continue HHPT at discharge for continued therapy.    Follow Up Recommendations Home health PT;Supervision/Assistance - 24 hour    Equipment Recommendations  None recommended by PT    Recommendations for Other Services       Precautions / Restrictions Precautions Precautions: Fall Restrictions Weight Bearing Restrictions: No      Mobility  Bed Mobility Overal bed mobility: Needs Assistance Bed Mobility: Supine to Sit;Sit to Supine     Supine to sit: Min assist Sit to supine: Min guard   General bed mobility comments: Verbal cues for technique.  However, patient reaching for PT to help pull patient to long sitting.  Cues to roll to side and use rail.  Min assist to raise trunk to sitting.  Patient able to return to supine with assist for safety only.  Transfers Overall transfer level: Needs assistance Equipment used: Straight cane Transfers: Sit to/from Stand Sit to Stand: Min assist         General transfer comment: Verbal cues for hand placement and safety.  Practiced standing x2.  Required assist to shift weight forward over feet, and to rise to standing.    Ambulation/Gait Ambulation/Gait assistance: Min guard Ambulation Distance (Feet): 160 Feet Assistive device: Straight cane Gait Pattern/deviations: Step-through pattern;Decreased step length - right;Decreased step length - left;Decreased stride  length;Decreased weight shift to left;Trunk flexed Gait velocity: Decreased Gait velocity interpretation: Below normal speed for age/gender General Gait Details: Patient using correct technique with cane.  Noted flexed posture, and short shuffling steps.  Cues to stand upright and walk at a safe speed.  At times, patient increases gait speed , increasing dyspnea.  Slightly decrease in balance with gait.  Stairs            Wheelchair Mobility    Modified Rankin (Stroke Patients Only)       Balance                                             Pertinent Vitals/Pain Pain Assessment: No/denies pain    Home Living Family/patient expects to be discharged to:: Private residence Living Arrangements: Spouse/significant other Available Help at Discharge: Family;Available 24 hours/day Type of Home: House Home Access: Stairs to enter Entrance Stairs-Rails: None Entrance Stairs-Number of Steps: 2 Home Layout: One level Home Equipment: Walker - 2 wheels;Cane - single point;Shower seat;Toilet riser      Prior Function Level of Independence: Independent with assistive device(s);Needs assistance   Gait / Transfers Assistance Needed: Uses cane or RW, depending on how he feels.  ADL's / Homemaking Assistance Needed: Assist for dressing at times due to shoulder limitations        Hand Dominance        Extremity/Trunk Assessment   Upper Extremity Assessment: RUE deficits/detail;LUE deficits/detail RUE Deficits / Details: Strength 4/5.  Decreased shoulder ROM to less than 90* flexion.  LUE Deficits / Details: Strength 4/5.  Decreased shoulder ROM to less than 90* flexion.   Lower Extremity Assessment: Generalized weakness;LLE deficits/detail   LLE Deficits / Details: Noted decreased knee ROM.  Noted deformity at knee joint.  Audible "popping" of knee during gait.  Cervical / Trunk Assessment: Kyphotic  Communication   Communication: No difficulties   Cognition Arousal/Alertness: Awake/alert Behavior During Therapy: WFL for tasks assessed/performed Overall Cognitive Status: Within Functional Limits for tasks assessed                      General Comments      Exercises        Assessment/Plan    PT Assessment Patient needs continued PT services  PT Diagnosis Difficulty walking;Abnormality of gait;Generalized weakness   PT Problem List Decreased strength;Decreased activity tolerance;Decreased balance;Decreased mobility;Cardiopulmonary status limiting activity  PT Treatment Interventions DME instruction;Gait training;Functional mobility training;Therapeutic activities;Therapeutic exercise;Balance training;Patient/family education   PT Goals (Current goals can be found in the Care Plan section) Acute Rehab PT Goals Patient Stated Goal: To go home soon PT Goal Formulation: With patient/family Time For Goal Achievement: 10/26/14 Potential to Achieve Goals: Good    Frequency Min 3X/week   Barriers to discharge        Co-evaluation               End of Session Equipment Utilized During Treatment: Gait belt Activity Tolerance: Patient limited by fatigue Patient left: in bed;with call bell/phone within reach;with family/visitor present Nurse Communication: Mobility status (Encouraged ambulation in hallway with nursing)         Time: [id number] PT Time Calculation (min): 21 min   Charges:   PT Evaluation $Initial PT Evaluation Tier I: 1 Procedure PT Treatments $Gait Training: 8-22 mins   PT G CodesVena Austria:          Almon Whitford H 10/19/2014, 9:42 AM Durenda HurtSusan H. Renaldo Fiddleravis, PT, [facility] Acute Rehab Services Pager [phone]

## 2014-10-19 NOTE — Progress Notes (Signed)
Family Medicine Teaching Service Daily Progress Note Intern Pager: [phone]  Patient name: [name] [name] Meiklejohn Medical record number: [id number] Date of birth: 06/26/1928 Age: 80 y.o. Gender: male  Primary Care Provider: Abigail MiyamotoPERRY,LAWRENCE EDWARD, MD Consultants: cardiology (will call today) Code Status: full code per discussion with pt and family upon admission  Pt Overview and Major Events to Date:  10/23 admit with dyspnea & fatigue  Assessment and Plan: [name] [name] Rabenold is a 80 y.o. male presenting with dyspnea. PMH is significant for atrial fibrillation, s/p pacemaker, aortic valve replacement, BPH, HTN.   # Dyspnea on exertion: BNP elevated at 1200 and patient has known hx of CHF.  However, CXR was clear and patient has no marked evidence of fluid overload on exam. PE is a possibility, but has no calf swelling/tenderness or hypoxemia.  INR therapeutic (but has had a recent subtherapeutic INR on 10/6).  Patient with known afib and is intermittently tachycardic particularly with exertion (likely culprit for symptoms).  - Cardiology now following; Patient on Amiodarone and Diltiazem but currently tachycardic (130's - 140' s with minimal exertion).  Will await cardiology recommendations.  - Echo obtained 10/24 - Preserved EF w/ grade 1 diastolic dysfunction - Troponin neg x 3  # Atrial fibrillation - Amio and Dilt per cards (as above) - coumadin per pharmacy (INR goal of 2.5-3.5) - INR therapeutic  # CKD stage 3 - Creatinine at baseline - Will continue to monitor.  # BPH: continue home finasteride and alfuzosin   # Hypothyroidism:  - continue home synthroid 75 mcg daily   # Hyperglycemia: no documented hx of DM - A1C 5.6.  FEN/GI: cont home PPI, heart healthy diet, saline lock IV  Prophylaxis: on coumadin   Disposition: Pending improvement in dyspnea and tachycardia.   Subjective:  Endorses SOB with exertion and poor appetite. No other complaints this am.   Objective: Temp:  [98  F (36.7 C)-98.6 F (37 C)] 98.6 F (37 C) (10/25 0515) Pulse Rate:  [67-97] 67 (10/25 0515) Resp:  [20-28] 20 (10/25 0515) BP: (111-128)/(48-70) 128/52 mmHg (10/25 0515) SpO2:  [95 %-96 %] 96 % (10/25 0515) Weight:  [186 lb 1.1 oz (84.4 kg)] 186 lb 1.1 oz (84.4 kg) (10/25 0515) Physical Exam: General:well appearing sitting up at bedside. NAD.  Cardiovascular: Tachycardic, irregularly irregular rhythm. No murmur.  Respiratory: CTAB; No increased WOB.  Abdomen: soft, nontender, nondistended.  Extremities: No LE edema.   Laboratory:  Recent Labs Lab 10/15/14 1227 10/17/14 1448  WBC 6.7 5.7  HGB 11.3* 11.5*  HCT 35.3* 35.6*  PLT 281.0 263    Recent Labs Lab 10/17/14 1448 10/18/14 0311 10/18/14 1642 10/19/14 0312  NA 137 138  --  137  K 4.2 3.8  --  4.1  CL 98 100  --  101  CO2 22 24  --  22  BUN 17 17  --  19  CREATININE 1.46* 1.48*  --  1.46*  CALCIUM 9.4 8.5  --  8.8  PROT  --   --  6.1  --   BILITOT  --   --  0.6  --   ALKPHOS  --   --  68  --   ALT  --   --  8  --   AST  --   --  15  --   GLUCOSE 178* 119*  --  101*    Imaging/Diagnostic Tests:  Echo Study Conclusions - Left ventricle: The cavity size was normal. There was mild concentric  hypertrophy. Systolic function was normal. The estimated ejection fraction was in the range of 50% to 55%. Wall motion was normal; there were no regional wall motion abnormalities. Doppler parameters are consistent with abnormal left ventricular relaxation (grade 1 diastolic dysfunction). - Ventricular septum: Septal motion showed abnormal function and dyssynergy. These changes are consistent with right ventricular pacing. - Aortic valve: A mechanical prosthesis was present. There was mild regurgitation. Valve area (VTI): 2.08 cm^2. Valve area (Vmax): 1.82 cm^2. Valve area (Vmean): 1.96 cm^2. - Left atrium: The atrium was moderately dilated. - Right atrium: The atrium was mildly dilated.  Dg Chest 2 View 10/17/2014     IMPRESSION: Stable chest x-ray without evidence of acute cardiopulmonary process.  Ct Head Wo Contrast 10/17/2014    IMPRESSION: No acute intracranial abnormality.    Tommie SamsJayce G Gabryelle Whitmoyer, DO 10/19/2014, 7:11 AM PGY-3, [facility] FPTS Intern pager: [phone], text pages welcome

## 2014-10-20 DIAGNOSIS — E038 Other specified hypothyroidism: Secondary | ICD-10-CM

## 2014-10-20 LAB — PROTIME-INR
INR: 2.73 — AB (ref 0.00–1.49)
PROTHROMBIN TIME: 29.2 s — AB (ref 11.6–15.2)

## 2014-10-20 NOTE — Progress Notes (Signed)
FMTS Attending Note Patient seen and examined by me, discussed with resident team and I agree with Dr Alben SpittleMcKeag's note for today.  Patient is being transitioned over to oral amiodarone, with plan for discharge in the morning. For outpatient monitoring of TSH in setting of hypothyroidism, on amiodarone. Paula ComptonJames Henry Utsey, MD

## 2014-10-20 NOTE — Progress Notes (Signed)
Family Medicine Teaching Service Daily Progress Note Intern Pager: [phone]  Patient name: [name] [name] Medical record number: [id number] Date of birth: 06/12/1928 Age: 80 y.o. Gender: male  Primary Care Provider: Abigail MiyamotoPERRY,LAWRENCE EDWARD, MD Consultants: cardiology (will call today) Code Status: full code per discussion with pt and family upon admission  Pt Overview and Major Events to Date:  10/23 admit with dyspnea & fatigue  Assessment and Plan: [name] KaufmannGattis Eberlin is a 80 y.o. male presenting with dyspnea. PMH is significant for atrial fibrillation, s/p pacemaker, aortic valve replacement, BPH, HTN.   # Dyspnea on exertion: BNP elevated at 1200 and patient has known hx of CHF.  However, CXR was clear and patient has no marked evidence of fluid overload on exam. PE is a possibility, but has no calf swelling/tenderness or hypoxemia.  INR therapeutic (but has had a recent subtherapeutic INR on 10/6).  Patient with known afib and is intermittently tachycardic particularly with exertion (likely culprit for symptoms).  - Cardiology now following; Patient on Amiodarone and Diltiazem but currently tachycardic (130's - 140' s with minimal exertion). - Changing to Amiodarone PO today  - Echo obtained 10/24 - Preserved EF w/ grade 1 diastolic dysfunction - Troponin neg x 3  # Atrial fibrillation - Amio and Dilt per cards (as above) - coumadin per pharmacy (INR goal of 2.5-3.5) - INR therapeutic  # CKD stage 3 - Creatinine at baseline - Will continue to monitor.  # BPH: continue home finasteride and alfuzosin   # Hypothyroidism:  - continue home synthroid 75 mcg daily   # Hyperglycemia: no documented hx of DM - A1C 5.6.  FEN/GI: cont home PPI, heart healthy diet, saline lock IV  Prophylaxis: on coumadin   Disposition: Likely DC tomorrow  Subjective:  Endorses SOB with exertion and poor appetite. Reports being ready to go home. Informed him that DC would not be today but maybe  tomorrow.  Objective: Temp:  [98.5 F (36.9 C)-98.8 F (37.1 C)] 98.8 F (37.1 C) (10/26 1010) Pulse Rate:  [73-93] 86 (10/26 0506) Resp:  [18-20] 20 (10/26 1010) BP: (113-126)/(55-96) 126/58 mmHg (10/26 1010) SpO2:  [96 %-98 %] 98 % (10/26 1010) Weight:  [187 lb 6.3 oz (85 kg)] 187 lb 6.3 oz (85 kg) (10/26 0506) Physical Exam: General:well appearing sitting up at bedside. NAD.  Cardiovascular: No longer tachycardic, Irregularly irregular rhythm. No murmur.  Respiratory: CTAB; No increased WOB.  Abdomen: soft, nontender, nondistended.  Extremities: No LE edema.   Laboratory:  Recent Labs Lab 10/15/14 1227 10/17/14 1448  WBC 6.7 5.7  HGB 11.3* 11.5*  HCT 35.3* 35.6*  PLT 281.0 263    Recent Labs Lab 10/17/14 1448 10/18/14 0311 10/18/14 1642 10/19/14 0312  NA 137 138  --  137  K 4.2 3.8  --  4.1  CL 98 100  --  101  CO2 22 24  --  22  BUN 17 17  --  19  CREATININE 1.46* 1.48*  --  1.46*  CALCIUM 9.4 8.5  --  8.8  PROT  --   --  6.1  --   BILITOT  --   --  0.6  --   ALKPHOS  --   --  68  --   ALT  --   --  8  --   AST  --   --  15  --   GLUCOSE 178* 119*  --  101*    Imaging/Diagnostic Tests:  Echo Study Conclusions - Left  ventricle: The cavity size was normal. There was mild concentric hypertrophy. Systolic function was normal. The estimated ejection fraction was in the range of 50% to 55%. Wall motion was normal; there were no regional wall motion abnormalities. Doppler parameters are consistent with abnormal left ventricular relaxation (grade 1 diastolic dysfunction). - Ventricular septum: Septal motion showed abnormal function and dyssynergy. These changes are consistent with right ventricular pacing. - Aortic valve: A mechanical prosthesis was present. There was mild regurgitation. Valve area (VTI): 2.08 cm^2. Valve area (Vmax): 1.82 cm^2. Valve area (Vmean): 1.96 cm^2. - Left atrium: The atrium was moderately dilated. - Right atrium: The atrium was  mildly dilated.  Dg Chest 2 View 10/17/2014    IMPRESSION: Stable chest x-ray without evidence of acute cardiopulmonary process.  Ct Head Wo Contrast 10/17/2014    IMPRESSION: No acute intracranial abnormality.    Kathee DeltonIan D McKeag, MD 10/20/2014, 12:31 PM PGY-1, [facility] FPTS Intern pager: [phone], text pages welcome

## 2014-10-20 NOTE — Progress Notes (Signed)
Patient Name: [name] [name] Date of Encounter: 10/20/2014  Active Problems:   Multifocal atrial tachycardia   Atrial fibrillation   Chronic diastolic congestive heart failure, NYHA class 2   Hypothyroidism   Essential hypertension   Long term current use of anticoagulant therapy - coumadin    Patient Profile: 80 yo male w/ hx SSS s/p MDT PPM, PAF, HTN, AI s/p mech AoVR, thoracic AoAneurysm, SVT, hypothyroid, coumadin, recent GIB [facility] admitted 10/24 w/ atrial fib, fatigue. Amio low-dose load started as OP, IV-->PO here, INR 1.8 10/06.    SUBJECTIVE: Feels OK this am, no chest pain or SOB. Still w/ sig DOE  OBJECTIVE Filed Vitals:   10/19/14 1404 10/19/14 2000 10/20/14 0201 10/20/14 0506  BP: 113/64 125/60 125/55 122/96  Pulse: 93 89 73 86  Temp: 98.6 F (37 C) 98.6 F (37 C) 98.5 F (36.9 C) 98.6 F (37 C)  TempSrc: Tympanic Oral Oral Oral  Resp: 18 20 20 20   Height:      Weight:    187 lb 6.3 oz (85 kg)  SpO2: 97% 97% 96% 97%    Intake/Output Summary (Last 24 hours) at 10/20/14 0720 Last data filed at 10/20/14 0600  Gross per 24 hour  Intake  954.1 ml  Output    975 ml  Net  -20.9 ml   Filed Weights   10/18/14 0525 10/19/14 0515 10/20/14 0506  Weight: 187 lb 13.7 oz (85.21 kg) 186 lb 1.1 oz (84.4 kg) 187 lb 6.3 oz (85 kg)    PHYSICAL EXAM General: Well developed, well nourished, male in no acute distress. Head: Normocephalic, atraumatic.  Neck: Supple without bruits, JVD 8 cm. Lungs:  Resp regular and unlabored, CTA. Heart: RRR, S1, S2, no S3, S4, Crisp valve click, + murmur; no rub. Abdomen: Soft, non-tender, non-distended, BS + x 4.  Extremities: No clubbing, cyanosis, no edema.  Neuro: Alert and oriented X 3. Moves all extremities spontaneously. Psych: Normal affect.  LABS: CBC:  Recent Labs  10/17/14 1448  WBC 5.7  HGB 11.5*  HCT 35.6*  MCV 79.8  PLT 263   INR:  Recent Labs  10/20/14 0350  INR 2.73*   Basic  Metabolic Panel:  Recent Labs  40/98/1110/24/15 0311 10/19/14 0312 10/19/14 1320  NA 138 137  --   K 3.8 4.1  --   CL 100 101  --   CO2 24 22  --   GLUCOSE 119* 101*  --   BUN 17 19  --   CREATININE 1.48* 1.46*  --   CALCIUM 8.5 8.8  --   MG  --   --  1.7   Liver Function Tests:  Recent Labs  10/18/14 1642  AST 15  ALT 8  ALKPHOS 68  BILITOT 0.6  PROT 6.1  ALBUMIN 2.8*   Cardiac Enzymes:  Recent Labs  10/17/14 2245 10/18/14 0311 10/18/14 1015  TROPONINI <0.30 <0.30 <0.30    Recent Labs  10/17/14 1459  TROPIPOC 0.02   BNP: Pro B Natriuretic peptide (BNP)  Date/Time Value Ref Range Status  10/17/2014  3:39 PM 1225.0* 0 - 450 pg/mL Final   Hemoglobin A1C:  Recent Labs  10/19/14 0312  HGBA1C 5.7*   Thyroid Function Tests:  Recent Labs  10/18/14 0311  TSH 3.100    TELE:  Atrial fib-->?SR    ECHO: 10/18/2014  Study Conclusions - Left ventricle: The cavity size was normal. There was mild concentric hypertrophy. Systolic function was  normal. The estimated ejection fraction was in the range of 50% to 55%. Wall motion was normal; there were no regional wall motion abnormalities. Doppler parameters are consistent with abnormal left ventricular relaxation (grade 1 diastolic dysfunction). - Ventricular septum: Septal motion showed abnormal function and dyssynergy. These changes are consistent with right ventricular pacing. - Aortic valve: A mechanical prosthesis was present. There was mild regurgitation. Valve area (VTI): 2.08 cm^2. Valve area (Vmax): 1.82 cm^2. Valve area (Vmean): 1.96 cm^2. - Left atrium: The atrium was moderately dilated. - Right atrium: The atrium was mildly dilated.  Radiology/Studies: Dg Chest 2 View 10/17/2014   CLINICAL DATA:  80 year old male with 3- 4 day history of lethargy, dizziness and shortness of breath  EXAM: CHEST  2 VIEW  COMPARISON:  Prior chest x-ray 09/17/2014  FINDINGS: Stable cardiomegaly with left ventricular  prominence. Mediastinal contours are also within normal limits. Prosthetic aortic valve. Left subclavian cardiac rhythm maintenance device with leads in unchanged position overlying the right atrium and right ventricle. Mild central bronchitic change similar compared to prior. Lingular atelectasis versus scarring is also unchanged. No pulmonary edema, effusion, pneumothorax or focal airspace consolidation. No acute osseous abnormality. Multilevel degenerative spurring throughout the spine. Bilateral glenohumeral joint osteoarthritis.  IMPRESSION: Stable chest x-ray without evidence of acute cardiopulmonary process.   Electronically Signed   By: Malachy MoanHeath  McCullough M.D.   On: 10/17/2014 16:23   Ct Head Wo Contrast 10/17/2014   CLINICAL DATA:  Fall.  Not feeling well.  EXAM: CT HEAD WITHOUT CONTRAST  TECHNIQUE: Contiguous axial images were obtained from the base of the skull through the vertex without intravenous contrast.  COMPARISON:  10/23/2013  FINDINGS: Mild chronic microvascular ischemic changes in the periventricular white matter are stable. Small mega cisterna magna again noted, an incidental finding. Negative for intra or extra-axial hemorrhage, mass effect, mass lesion, or evidence of acute cortically based infarction. Atherosclerotic calcification of the vertebral arteries and basilar artery noted. The skull is intact. Visualized paranasal sinuses and mastoid air cells are clear. Leftward directed spur on the nasal septum.  IMPRESSION: No acute intracranial abnormality.   Electronically Signed   By: Britta MccreedySusan  Turner M.D.   On: 10/17/2014 16:24     Current Medications:  . alfuzosin  10 mg Oral Daily  . diltiazem  300 mg Oral Daily  . finasteride  5 mg Oral Daily  . levothyroxine  75 mcg Oral QAC breakfast  . pantoprazole  40 mg Oral Daily  . sodium chloride  3 mL Intravenous Q12H  . sodium chloride  3 mL Intravenous Q12H  . warfarin  4 mg Oral q1800  . Warfarin - Pharmacist Dosing Inpatient   Does  not apply q1800   . amiodarone 30 mg/hr (10/19/14 2149)    ASSESSMENT AND PLAN: Active Problems:   Multifocal atrial tachycardia - on IV Amio, tele possibly sinus now, will ck ECG. Continue amio, consider change to PO, especially if in SR.    Atrial fibrillation - see above    Chronic diastolic congestive heart failure, NYHA class 2 - per primary MD, volume status appears stable    Hypothyroidism - per IM, TSH OK    Essential hypertension - per IM, SBP 110s-120s    Long term current use of anticoagulant therapy - coumadin - per IM, is therapeutic but was low earlier this month.   Signed, [name] [name] , PA-C 7:20 AM 10/20/2014  I have personally seen and examined this patient with [name] Demarkhonda Barrett, PA-C. I agree  with the assessment and plan as outlined above. Sinus this am. Will stop IV amio and start amiodarone 200 mg po BID. Will hopefully be ready for d/c tomorrow. He is followed in our EP clinic by Dr. Ladona Ridgel. INR is therapeutic. PPM in place.   [name] [name] 10/20/2014 11:47 AM

## 2014-10-20 NOTE — Progress Notes (Signed)
ANTICOAGULATION CONSULT NOTE - FOLLOW UP  Pharmacy Consult:  Coumadin Indication: atrial fibrillation and mechanical aortic valve  No Known Allergies  Patient Measurements: Height: 6' (182.9 cm) Weight: 187 lb 6.3 oz (85 kg) IBW/kg (Calculated) : 77.6  Vital Signs: Temp: 98.6 F (37 C) (10/26 0506) Temp Source: Oral (10/26 0506) BP: 122/96 mmHg (10/26 0506) Pulse Rate: 86 (10/26 0506)  Labs:  Recent Labs  10/17/14 1448 10/17/14 2245 10/18/14 0311 10/18/14 1015 10/19/14 0312 10/20/14 0350  HGB 11.5*  --   --   --   --   --   HCT 35.6*  --   --   --   --   --   PLT 263  --   --   --   --   --   LABPROT 25.4*  --  27.9*  --  27.3* 29.2*  INR 2.29*  --  2.58*  --  2.51* 2.73*  CREATININE 1.46*  --  1.48*  --  1.46*  --   TROPONINI  --  <0.30 <0.30 <0.30  --   --     Estimated Creatinine Clearance: 41.3 ml/min (by C-G formula based on Cr of 1.46).    Assessment: 86 YOM on warfarin prior to admission for mechanical aortic valve and atrial fibrillation. INR remains therapeutic and stable on home regimen.  Siginificant PMH includes recent GIB secondary to gastritis/duodenitis.  No bleeding reported currently.   Goal of Therapy:  INR 2.5-3.5 Monitor platelets by anticoagulation protocol: Yes    Plan:  - Continue Coumadin 4mg  PO daily at 1800 - Change PT / INR to MWF    Khamil Lamica D. Laney Potashang, PharmD, BCPS Pager:  [phone] 10/20/2014, 7:53 AM

## 2014-10-20 NOTE — Care Management Note (Addendum)
  Page 2 of 2   10/21/2014     4:18:16 PM CARE MANAGEMENT NOTE 10/21/2014  Patient:  [name] [name],[name] [name]   Account Number:  [id number]  Date Initiated:  10/20/2014  Documentation initiated by:  Priscila Bean  Subjective/Objective Assessment:   CHF     Action/Plan:   CM to follow for disposition needs   Anticipated DC Date:  10/21/2014   Anticipated DC Plan:  HOME W HOME HEALTH SERVICES  In-house referral  NA      DC Planning Services  CM consult      [facility]  Resumption Of Svcs/PTA Provider   Choice offered to / List presented to:  NA        HH arranged  HH-1 RN  HH-10 DISEASE MANAGEMENT  HH-2 PT      [facility]   Status of service:  Completed, signed off Medicare Important Message given?  YES (If response is "NO", the following Medicare IM given date fields will be blank) Date Medicare IM given:  10/20/2014 Medicare IM given by:  Lamona Eimer Date Additional Medicare IM given:   Additional Medicare IM given by:    Discharge Disposition:  HOME W HOME HEALTH SERVICES  Per UR Regulation:  Reviewed for med. necessity/level of care/duration of stay  If discussed at Long Length of Stay Meetings, dates discussed:    Comments:  Valeda Corzine RN, BSN, MSHL, CCM  Nurse - Case Manager,  ([id number])  [phone]  10/21/2014 Social:  From home with wife. PT RECS:  HH PT and 24 hour supervision [facility]HS:  [facility] - HH order faxed provider.

## 2014-10-20 NOTE — Progress Notes (Signed)
Physical Therapy Treatment Patient Details Name: Maryelizabeth KaufmannGattis Mangus MRN: [id number] DOB: 04/05/1928 Today's Date: 10/20/2014    History of Present Illness Patient is an 80 yo male admitted 10/17/14 with exertional dyspnea, lethargy, and dizziness x several weeks pta.  Patient with Afib and MAT (multifactoral atrial tachy).  PMH:  Afib, pacemaker, AV replacement, HTN, CHF, cardiomyopathy, ?vertigo.    PT Comments    Patient making progress with mobility and gait today.    Follow Up Recommendations  Home health PT;Supervision/Assistance - 24 hour     Equipment Recommendations  None recommended by PT    Recommendations for Other Services       Precautions / Restrictions Precautions Precautions: Fall Restrictions Weight Bearing Restrictions: No    Mobility  Bed Mobility Overal bed mobility: Modified Independent Bed Mobility: Supine to Sit     Supine to sit: Modified independent (Device/Increase time)     General bed mobility comments: No physical assist required today to move to sitting  Transfers Overall transfer level: Needs assistance Equipment used: Straight cane Transfers: Sit to/from Stand Sit to Stand: Min assist         General transfer comment: Verbal cues for hand placement.  Patient able to stand from bed with min guard assist, but required min assist from lower toilet.  Assist to control descent to toilet.  Ambulation/Gait Ambulation/Gait assistance: Min guard Ambulation Distance (Feet): 210 Feet Assistive device: Straight cane Gait Pattern/deviations: Step-through pattern;Decreased stride length;Trunk flexed Gait velocity: Decreased Gait velocity interpretation: Below normal speed for age/gender General Gait Details: Cues to try to stand upright and look forward during gait.  Patient with flexed posture.  Patient with steady, safe gait speed today.  HR at 105 with ambulation.  Dyspnea 2/4.   Stairs            Wheelchair Mobility    Modified  Rankin (Stroke Patients Only)       Balance                                    Cognition Arousal/Alertness: Awake/alert Behavior During Therapy: WFL for tasks assessed/performed Overall Cognitive Status: Within Functional Limits for tasks assessed                      Exercises      General Comments        Pertinent Vitals/Pain Pain Assessment: No/denies pain    Home Living                      Prior Function            PT Goals (current goals can now be found in the care plan section) Progress towards PT goals: Progressing toward goals    Frequency  Min 3X/week    PT Plan Current plan remains appropriate    Co-evaluation             End of Session Equipment Utilized During Treatment: Gait belt Activity Tolerance: Patient tolerated treatment well Patient left: in bed;with call bell/phone within reach;with family/visitor present (sitting EOB)     Time: [id number] PT Time Calculation (min): 28 min  Charges:  $Gait Training: 23-37 mins                    G Codes:      Vena AustriaDavis, Demya Scruggs H 10/20/2014, 11:54 AM Durenda HurtSusan H. Earlene Plateravis, PT,  [place] Pager [phone]

## 2014-10-21 DIAGNOSIS — I5032 Chronic diastolic (congestive) heart failure: Secondary | ICD-10-CM

## 2014-10-21 MED ORDER — AMIODARONE HCL 200 MG PO TABS
200.0000 mg | ORAL_TABLET | Freq: Two times a day (BID) | ORAL | Status: DC
  Administered 2014-10-21: 200 mg via ORAL
  Filled 2014-10-21 (×2): qty 1

## 2014-10-21 MED ORDER — DILTIAZEM HCL ER COATED BEADS 300 MG PO CP24: 300.0000 mg | ORAL_CAPSULE | Freq: Every day | ORAL | Status: DC

## 2014-10-21 NOTE — Discharge Instructions (Signed)
Warfarin Coagulopathy Warfarin (Coumadin) coagulopathy refers to bleeding that may occur as a complication of the medicine warfarin. Warfarin is an oral blood thinner (anticoagulant). Warfarin is used for medical conditions where thinning of the blood is needed to prevent blood clots.  CAUSES Bleeding is the most common and most serious complication of warfarin. The amount of bleeding is related to the warfarin dose and length of treatment. In addition, bleeding complications can also occur due to:  Intentional or accidental warfarin overdose.  Underlying medical conditions.  Dietary changes.  Medicine, herbal, supplement, or alcohol interactions. SYMPTOMS Severe bleeding while on warfarin may occur from any tissue or organ. Symptoms of the blood being too thin may include:  Bleeding from the nose or gums.  Blood in bowel movements which may appear as bright red, dark, or black tarry stools.  Blood in the urine which may appear as pink, red, or brown urine.  Unusual bruising or bruising easily.  A cut that does not stop bleeding within 10 minutes.  Vomiting blood or continuous nausea for more than 1 day.  Coughing up blood.  Broken blood vessels in your eye (subconjunctival hemorrhage).  Abdominal or back pain with or without flank bruising.  Sudden, severe headache.  Sudden weakness or numbness of the face, arm, or leg, especially on one side of the body.  Sudden confusion.  Trouble speaking (aphasia) or understanding.  Sudden trouble seeing in one or both eyes.  Sudden trouble walking.  Dizziness.  Loss of balance or coordination.  Vaginal bleeding.  Swelling or pain at an injection site.  Superficial fat tissue death (necrosis) which may cause skin scarring. This is more common in women and may first present as pain in the waist, thighs, or buttocks.  Fever. HOME CARE INSTRUCTIONS  Always contact your health care provider of any concerns or signs of  possible warfarin coagulopathy as soon as possible.  Take warfarin exactly as directed by your health care provider. It is recommended that you take your warfarin dose at the same time of the day. If you have been told to stop taking warfarin, do not resume taking warfarin until directed to do so by your health care provider. Follow your health care provider's instructions if you accidentally take an extra dose or miss a dose of warfarin. It is very important to take warfarin as directed since bleeding or blood clots could result in chronic or permanent injury, pain, or disability.  Keep all follow-up appointments with your health care provider as directed. It is very important to keep your appointments. Not keeping appointments could result in a chronic or permanent injury, pain, or disability because warfarin is a medicine that requires close monitoring.  While taking warfarin, you will need to have regular blood tests to measure your blood clotting time. These blood tests usually include both the prothrombin time (PT) and International Normalized Ratio (INR) tests. The PT and INR results allow your health care provider to adjust your dose of warfarin. The dose can change for many reasons. It is critically important that you have your PT and INR levels drawn exactly as directed. Your warfarin dose may stay the same or change depending on what the PT and INR results are. Be sure to follow up with your health care provider regarding your PT and INR test results and what your warfarin dosage should be.  Many medicines can interfere with warfarin and affect the PT and INR results. You must tell your health care provider about   any and all medicines you take. This includes all vitamins and supplements. Ask your health care provider before taking these. Prescription and over-the-counter medicine consistency is critical to warfarin management. It is important that potential interactions are checked before you  start a new medicine. Be especially cautious with aspirin and anti-inflammatory medicines. Ask your health care provider before taking these. Medicines such as antibiotics and acid-reducing medicine can interact with warfarin and can cause an increased warfarin effect. Warfarin can also interfere with the effectiveness of medicines you are taking. Do not take or discontinue any prescribed or over-the-counter medicine except on the advice of your health care provider or pharmacist.  Some vitamins, supplements, and herbal products interfere with the effectiveness of warfarin. Vitamin E may increase the anticoagulant effects of warfarin. Vitamin K can cause warfarin to be less effective. Do not take or discontinue any vitamin, supplement, or herbal product except on the advice of your health care provider or pharmacist.  Eat what you normally eat and keep the vitamin K content of your diet consistent. Avoid major changes in your diet, or notify your health care provider before changing your diet. Suddenly getting a lot more vitamin K could cause your blood to clot too quickly. A sudden decrease in vitamin K intake could cause your blood to clot too slowly. These changes in vitamin K intake could lead to dangerous blood clotsor to bleeding. To keep your vitamin K intake consistent, you must be aware of which foods contain moderate or high amounts of vitamin K. Some foods high in vitamin K include spinach, kale, broccoli, cabbage, greens, Brussels sprouts, asparagus, bok choy, coleslaw, parsley, and green tea. Arrange a visit with a dietitian to answer your questions.  If you have a loss of appetite or get the stomach flu (viral gastroenteritis), talk to your health care provider as soon as possible. A decrease in your normal vitamin K intake can make you more sensitive to your usual dose of warfarin.  Some medical conditions may increase your risk for bleeding while you are taking warfarin. A fever, diarrhea  lasting more than a day, worsening heart failure, or worsening liver function are some medical conditions that could affect warfarin. Contact your health care provider if you have any of these medical conditions.  Be careful not to cut yourself when using sharp objects or while shaving.  Alcohol can change the body's ability to handle warfarin. It is best to avoid alcoholic drinks or consume only very small amounts while taking warfarin. Notify your health care provider if you change your alcohol intake. A sudden increase in alcohol use can increase your risk of bleeding. Chronic alcohol use can cause warfarin to be less effective.  Limit physical activities or sports that could result in a fall or cause injury.  Do not use warfarin if you are pregnant.  Inform all your health care providers and your dentist that you take warfarin.  Inform all health care providers if you are taking warfarin and aspirin or platelet inhibitor medicines such as clopidogrel, ticagrelor, or prasugrel. Use of these medicines in addition to warfarin can increase your risk of bleeding or death. Taking these medicines together should only be done under the direct care of your health care providers. SEEK IMMEDIATE MEDICAL CARE IF:  You cough up blood.  You have dark or black stools or there is bright red blood coming from your rectum.  You vomit blood or have nausea for more than 1 day.  You have   blood in the urine or pink-colored urine.  You have unusual bruising or have increased bruising.  You have bleeding from the nose or gums that does not stop quickly.  You have a cut that does not stop bleeding within 2-3 minutes.  You have sudden weakness or numbness of the face, arm, or leg, especially on one side of the body.  You have sudden confusion.  You have trouble speaking (aphasia) or understanding.  You have sudden trouble seeing in one or both eyes.  You have sudden trouble walking.  You have  dizziness.  You have a loss of balance or coordination.  You have a sudden, severe headache.  You have a serious fall or head injury, even if you are not bleeding.  You have swelling or pain at an injection site.  You have unexplained tenderness or pain in the abdomen, back, waist, thighs, or buttocks.  You have a fever. Any of these symptoms may represent a serious problem that is an emergency. Do not wait to see if the symptoms will go away. Get medical help right away. Call your local emergency services (911 in [place] Do not drive yourself to the hospital. Document Released: 11/20/2006 Document Revised: 04/28/2014 Document Reviewed: 05/22/2012 [employer] Patient Information 2015 [employer]. This information is not intended to replace advice given to you by your health care provider. Make sure you discuss any questions you have with your health care provider.  

## 2014-10-21 NOTE — Progress Notes (Signed)
Patient Name: [name] [name] Date of Encounter: 10/21/2014  Active Problems:   Multifocal atrial tachycardia   Atrial fibrillation   Chronic diastolic congestive heart failure, NYHA class 2   Hypothyroidism   Essential hypertension   Long term current use of anticoagulant therapy - coumadin    Patient Profile: 80 yo male w/ hx SSS s/p MDT PPM, PAF, HTN, AI s/p mech AoVR, thoracic AoAneurysm, SVT, hypothyroid, coumadin, recent GIB [facility]), admitted 10/24 w/ atrial fib, fatigue. Amio low-dose load started as OP, IV-->PO here, INR 1.8 10/06.   SUBJECTIVE: Pt says IM MD told him he was going home today. He is fine with that. Denies chest pain or SOB.  OBJECTIVE Filed Vitals:   10/20/14 1517 10/20/14 2100 10/21/14 0155 10/21/14 0622  BP: 110/55 124/56 141/57 139/53  Pulse: 74 71 68 74  Temp: 98.7 F (37.1 C) 98.1 F (36.7 C) 98.3 F (36.8 C) 98.3 F (36.8 C)  TempSrc: Oral Oral Oral Oral  Resp: 18 20 18 17   Height:      Weight:    187 lb 6.3 oz (85 kg)  SpO2: 97% 99% 97% 96%    Intake/Output Summary (Last 24 hours) at 10/21/14 0933 Last data filed at 10/21/14 [id number]  Gross per 24 hour  Intake    600 ml  Output    875 ml  Net   -275 ml   Filed Weights   10/19/14 0515 10/20/14 0506 10/21/14 0622  Weight: 186 lb 1.1 oz (84.4 kg) 187 lb 6.3 oz (85 kg) 187 lb 6.3 oz (85 kg)    PHYSICAL EXAM General: Well developed, well nourished, male in no acute distress. Head: Normocephalic, atraumatic.  Neck: Supple without bruits, JVD. Lungs:  Resp regular and unlabored, CTA. Heart: RRR, S1, S2, no S3, S4, or murmur; no rub. Abdomen: Soft, non-tender, non-distended, BS + x 4.  Extremities: No clubbing, cyanosis, edema.  Neuro: Alert and oriented X 3. Moves all extremities spontaneously. Psych: Normal affect.  LABS: INR: Recent Labs  10/20/14 0350  INR 2.73*   Basic Metabolic Panel: Recent Labs  10/19/14 0312 10/19/14 1320  NA 137  --   K 4.1  --   CL  101  --   CO2 22  --   GLUCOSE 101*  --   BUN 19  --   CREATININE 1.46*  --   CALCIUM 8.8  --   MG  --  1.7   Liver Function Tests: Recent Labs  10/18/14 1642  AST 15  ALT 8  ALKPHOS 68  BILITOT 0.6  PROT 6.1  ALBUMIN 2.8*   Cardiac Enzymes: Recent Labs  10/18/14 1015  TROPONINI <0.30   BNP: Pro B Natriuretic peptide (BNP)  Date/Time Value Ref Range Status  10/17/2014  3:39 PM 1225.0* 0 - 450 pg/mL Final   Hemoglobin A1C: Recent Labs  10/19/14 0312  HGBA1C 5.7*   Lab Results  Component Value Date   TSH 3.100 10/18/2014    TELE:   SR, PVCs, occasional pauses w/ paced beats.     Current Medications:  . alfuzosin  10 mg Oral Daily  . amiodarone  200 mg Oral BID  . diltiazem  300 mg Oral Daily  . finasteride  5 mg Oral Daily  . levothyroxine  75 mcg Oral QAC breakfast  . pantoprazole  40 mg Oral Daily  . sodium chloride  3 mL Intravenous Q12H  . sodium chloride  3 mL Intravenous Q12H  .  warfarin  4 mg Oral q1800  . Warfarin - Pharmacist Dosing Inpatient   Does not apply q1800      ASSESSMENT AND PLAN: Active Problems:   Multifocal atrial tachycardia - continue amio and Dilt as OP, f/u in office, can interrogate device and adjust meds as needed.     Atrial fibrillation - on coumadin, see above    Chronic diastolic congestive heart failure, NYHA class 2 - volume stable on oral Lasix, follow weights at home, will have HHRN    Hypothyroidism - per IM, TSH was OK    Essential hypertension - per IM, SBP 110s-140s on current rx    Long term current use of anticoagulant therapy - coumadin - coumadin check per IM, if cards needs to follow, please call.  Melida QuitterSigned, Rhonda Barrett , PA-C 9:33 AM 10/21/2014 [phone]  I have personally seen and examined this patient with Theodore Demarkhonda Barrett, PA-C. I agree with the assessment and plan as outlined above. He can be transitioned to po amiodarone today. D/c amiodarone IV now. Volume status is ok. OK to d/c today. He can  f/u with Dr. Ladona Ridgelaylor in our EP clinic. We will arrange.   MCALHANY,CHRISTOPHER 10/21/2014 10:57 AM

## 2014-10-21 NOTE — Discharge Summary (Signed)
Family Medicine [facility] Discharge Summary  Patient name: [name] [name] Medical record number: [id number] Date of birth: 05/15/1928 Age: 80 y.o. Gender: male Date of Admission: 10/17/2014  Date of Discharge: 10/21/14 Admitting Physician: Leighton Roachodd D McDiarmid, MD  Primary Care Provider: Abigail MiyamotoPERRY,LAWRENCE EDWARD, MD Consultants: Cardiology  Indication for Hospitalization:  Fatigue and dyspnea  Discharge Diagnoses/Problem List:  Dyspnea on excertion A fib CKD III  Disposition: home   Discharge Condition: stable  Discharge Exam:  General:well appearing sitting up at bedside. NAD.  Cardiovascular: No longer tachycardic, Irregularly irregular rhythm. No murmur.  Respiratory: CTAB; No increased WOB.  Abdomen: soft, nontender, nondistended.  Extremities: No LE edema.    Brief Hospital Course:  Patient is a 80yo male with a history of pacer placement who presented to the ED w/ increased SOB w/ exertions and some dizziness. Patient is currently being followed by Dr. Ladona Ridgelaylor for outpatient cardiology. Patient last saw Dr. Ladona Ridgelaylor 2 days prior to admission.he was started on low-dose amiodarone at that visit. Patient states that his dyspnea has progressively gotten worse over the last 1-2 days. He was accompanied by increased fatigue. Patient denied any chest pain orthopnea edema weight gain nocturnal dyspnea on admission. Patient reports good compliance with medications.  After admission a head CT was obtained which showed no acute intracranial abnormalities. Echocardiogram was also performed.this showed an EF of 50-55%. Normal wall motion. Grade 1 diastolic dysfunction.some abnormal septal motion was observed which is consistent with right ventricular pacing. Patient was placed on amiodarone drip. Diltiazem 300 mg daily. Patient responded well to this medication. He was found to be asymptomatic for > 24 hours.  Patient was transitioned to oral amiodarone 200 mg twice a day on 10/27.  Patient tolerated this well and was later discharged.  It is recommended that patient have close follow-up with his PCP. Specifically consideration for rechecking patient's INR frequently due to patient being on both warfarin and amiodarone, as these medications can interact with one another.   Issues for Follow Up:  - Recheck INR; patient on Amiodarone which is known to cross react w/ Warfarin.  - Medication compliance. - Patient continues to have diminished appetite x 1 month.  Significant Procedures: none  Significant Labs and Imaging:   Recent Labs Lab 10/15/14 1227 10/17/14 1448  WBC 6.7 5.7  HGB 11.3* 11.5*  HCT 35.3* 35.6*  PLT 281.0 263    Recent Labs Lab 10/17/14 1448 10/18/14 0311 10/18/14 1642 10/19/14 0312 10/19/14 1320  NA 137 138  --  137  --   K 4.2 3.8  --  4.1  --   CL 98 100  --  101  --   CO2 22 24  --  22  --   GLUCOSE 178* 119*  --  101*  --   BUN 17 17  --  19  --   CREATININE 1.46* 1.48*  --  1.46*  --   CALCIUM 9.4 8.5  --  8.8  --   MG  --   --   --   --  1.7  ALKPHOS  --   --  68  --   --   AST  --   --  15  --   --   ALT  --   --  8  --   --   ALBUMIN  --   --  2.8*  --   --      Results/Tests Pending at Time of Discharge: none  Discharge Medications:    Medication List         ACIDOPHILUS PO  Take 1 capsule by mouth 2 (two) times daily.     alfuzosin 10 MG 24 hr tablet  Commonly known as:  UROXATRAL  Take 10 mg by mouth daily.     amiodarone 200 MG tablet  Commonly known as:  PACERONE  Take 1 tablet (200 mg total) by mouth daily.     diltiazem 300 MG 24 hr capsule  Commonly known as:  CARDIZEM CD  Take 1 capsule (300 mg total) by mouth daily.     finasteride 5 MG tablet  Commonly known as:  PROSCAR  Take 5 mg by mouth daily.     levothyroxine 75 MCG tablet  Commonly known as:  SYNTHROID, LEVOTHROID  Take 75 mcg by mouth daily before breakfast.     omeprazole 20 MG capsule  Commonly known as:  PRILOSEC  Take 20  mg by mouth daily.     PRENATAL VITAMINS PLUS PO  Take by mouth.     traMADol 50 MG tablet  Commonly known as:  ULTRAM  Take 100 mg by mouth 3 (three) times daily.     warfarin 4 MG tablet  Commonly known as:  COUMADIN  Take 4 mg by mouth daily.        Discharge Instructions: Please refer to Patient Instructions section of EMR for full details.  Patient was counseled important signs and symptoms that should prompt return to medical care, changes in medications, dietary instructions, activity restrictions, and follow up appointments.   Follow-Up Appointments: Follow-up Information   [facility] (Registered Nurse and Physical Therapy services to start within 24- 48 hours of discharge)    Specialty:  Home Health Services   Contact information:   [address] [place] [address] [phone]       Follow up with Lewayne BuntingGregg Taylor, MD On 10/27/2014. (@2 :45 with Tereso NewcomerScott Weaver [facility] with [facility] )    Specialty:  Cardiology   Contact information:   [address] [place] [address] [phone]       Follow up with Abigail MiyamotoPERRY,LAWRENCE EDWARD, MD On 10/23/2014. (@ 9:30 AM)    Specialty:  Family Medicine   Contact information:   [address] [place] [address] [phone]       Kathee DeltonIan D McKeag, MD 10/21/2014, 3:22 PM PGY-1, [facility]

## 2014-10-21 NOTE — Progress Notes (Signed)
UR completed Vaeda Westall K. Devlon Dosher, RN, BSN, MSHL, CCM  10/21/2014 2:54 PM

## 2014-10-24 ENCOUNTER — Ambulatory Visit (INDEPENDENT_AMBULATORY_CARE_PROVIDER_SITE_OTHER): Payer: Medicare Other | Admitting: *Deleted

## 2014-10-24 DIAGNOSIS — I359 Nonrheumatic aortic valve disorder, unspecified: Secondary | ICD-10-CM

## 2014-10-24 DIAGNOSIS — Z7901 Long term (current) use of anticoagulants: Secondary | ICD-10-CM

## 2014-10-24 LAB — POCT INR: INR: 2.9

## 2014-10-27 ENCOUNTER — Ambulatory Visit (INDEPENDENT_AMBULATORY_CARE_PROVIDER_SITE_OTHER): Payer: Medicare Other | Admitting: Physician Assistant

## 2014-10-27 ENCOUNTER — Encounter: Payer: Self-pay | Admitting: Physician Assistant

## 2014-10-27 VITALS — BP 130/60 | HR 103 | Ht 72.0 in | Wt 194.0 lb

## 2014-10-27 DIAGNOSIS — I471 Supraventricular tachycardia: Secondary | ICD-10-CM

## 2014-10-27 DIAGNOSIS — Z952 Presence of prosthetic heart valve: Secondary | ICD-10-CM

## 2014-10-27 DIAGNOSIS — I10 Essential (primary) hypertension: Secondary | ICD-10-CM

## 2014-10-27 DIAGNOSIS — Z9889 Other specified postprocedural states: Secondary | ICD-10-CM

## 2014-10-27 DIAGNOSIS — Z8679 Personal history of other diseases of the circulatory system: Secondary | ICD-10-CM

## 2014-10-27 DIAGNOSIS — I359 Nonrheumatic aortic valve disorder, unspecified: Secondary | ICD-10-CM

## 2014-10-27 DIAGNOSIS — I480 Paroxysmal atrial fibrillation: Secondary | ICD-10-CM

## 2014-10-27 DIAGNOSIS — Z954 Presence of other heart-valve replacement: Secondary | ICD-10-CM

## 2014-10-27 DIAGNOSIS — R0602 Shortness of breath: Secondary | ICD-10-CM

## 2014-10-27 DIAGNOSIS — Z8719 Personal history of other diseases of the digestive system: Secondary | ICD-10-CM

## 2014-10-27 LAB — BASIC METABOLIC PANEL
BUN: 17 mg/dL (ref 6–23)
CALCIUM: 8.4 mg/dL (ref 8.4–10.5)
CO2: 23 mEq/L (ref 19–32)
Chloride: 102 mEq/L (ref 96–112)
Creatinine, Ser: 1.6 mg/dL — ABNORMAL HIGH (ref 0.4–1.5)
GFR: 44.62 mL/min — AB (ref >60.00)
GLUCOSE: 154 mg/dL — AB (ref 70–99)
Potassium: 4 mEq/L (ref 3.5–5.1)
SODIUM: 135 meq/L (ref 135–145)

## 2014-10-27 LAB — BRAIN NATRIURETIC PEPTIDE: PRO B NATRI PEPTIDE: 109 pg/mL — AB (ref 0.0–100.0)

## 2014-10-27 MED ORDER — AMIODARONE HCL 200 MG PO TABS: ORAL_TABLET | ORAL | Status: DC

## 2014-10-27 NOTE — Progress Notes (Signed)
Cardiology Office Note   Date:  10/27/2014   ID:  [name] [name], DOB 03/17/1928, MRN [id number]  PCP:  Abigail MiyamotoPERRY,LAWRENCE EDWARD, MD  Cardiologist:  Dr. Olga MillersBrian Crenshaw   Electrophysiologist:  Dr. Lewayne BuntingGregg Taylor    History of Present Illness: [name] [name] is a 80 y.o. male with a hx of aortic insufficiency and ascending thoracic aortic aneurysm s/p Bentall procedure with mechanical AVR in 2006, sinus bradycardia status post permanent pacemaker implantation, PAF, SVT, EF of 45-50% in the past, HTN, hypothyroidism.  Patient was seen by Dr. Ladona Ridgelaylor in 10/15 with recurrent and increasingly symptomatic atrial fibrillation. Oral amiodarone was started. Of note, the patient had a recent admission to the hospital and [facility] with GI bleeding (diverticular).  He was then admitted 10/23-10/27. He presented to the hospital with increasing dyspnea and fatigue. Patient was noted have intermittent episodes of tachycardia. This was felt to represent multifocal atrial tachycardia. He was placed on IV amiodarone. He was ultimately transitioned back to oral amiodarone. CCB dose was increased.  Follow-up echocardiogram demonstrated normal LV function with an EF of 50-55%. AVR was functioning appropriately. He returns for follow-up.  Patient continues to complain of weakness. He has felt this way for the better part of the last 6 months. He notes dyspnea with moderate activities. He is NYHA 2b-3. He denies orthopnea, PND or edema. He denies weight changes. He denies chest pain. He denies syncope. He had an episode of near syncope several weeks ago prior to seeing Dr. Ladona Ridgelaylor.  He tells me that his PCP gave him Lasix to take as needed for sudden weight gain.   Studies:  - LHC (1/06):  Proximal RCA 30%, large thoracic aortic aneurysm (7 cm by CT scan)  - Echo (09/2014):  Mild concentric hypertrophy. EF 50% to 55%. Wall motion was normal. Grade 1 diastolic dysfunction.  Aortic valve: A mechanical prosthesis was present.  There was mildregurgitation. Mean gradient (S): 10 mm Hg. Peak gradient (S): 22 mm Hg.Moderate LAE, mild RAE  - Carotid US (11/14):  Bilateral 1-39% ICA   Recent Labs/Images:  10/17/2014: Hemoglobin 11.5*; Pro B Natriuretic peptide (BNP) 1225.0* 10/18/2014: ALT 8; TSH 3.100 10/19/2014: BUN 19; Creatinine 1.46*; Potassium 4.1; Sodium 137   Chest CTA (11/14):  Stable aneurysmal disease of the native ascending thoracic aorta distal to a replaced aortic root. Maximal diameter of the distal ascending thoracic aorta is 4.7 cm. The aortic root graft shows stable patency and CT appearance without evidence of complication.  Dg Chest 2 View   10/17/2014      IMPRESSION: Stable chest x-ray without evidence of acute cardiopulmonary process.   Electronically Signed   By: Malachy MoanHeath  McCullough M.D.   On: 10/17/2014 16:23   Ct Head Wo Contrast   10/17/2014   IMPRESSION: No acute intracranial abnormality.   Electronically Signed   By: Britta MccreedySusan  Turner M.D.   On: 10/17/2014 16:24     Wt Readings from Last 3 Encounters:  10/21/14 187 lb 6.3 oz (85 kg)  10/15/14 193 lb (87.544 kg)  04/21/14 198 lb (89.812 kg)     Past Medical History  Diagnosis Date  . Elevated PSA   . Aortic insufficiency   . Thoracic aortic aneurysm   . Carpal tunnel syndrome   . SVT (supraventricular tachycardia)   . Amaurosis fugax   . BPH (benign prostatic hypertrophy)   . Hypothyroidism   . Cardiac pacemaker     Medtronic    Current Outpatient Prescriptions  Medication Sig Dispense  Refill  . alfuzosin (UROXATRAL) 10 MG 24 hr tablet Take 10 mg by mouth daily.     [name] amiodarone (PACERONE) 200 MG tablet Take 1 tablet (200 mg total) by mouth daily. 30 tablet 3  . diltiazem (CARDIZEM CD) 300 MG 24 hr capsule Take 1 capsule (300 mg total) by mouth daily. 30 capsule 1  . finasteride (PROSCAR) 5 MG tablet Take 5 mg by mouth daily.    . Lactobacillus (ACIDOPHILUS PO) Take 1 capsule by mouth 2 (two) times daily.    [name] levothyroxine  (SYNTHROID, LEVOTHROID) 75 MCG tablet Take 75 mcg by mouth daily before breakfast.    . omeprazole (PRILOSEC) 20 MG capsule Take 20 mg by mouth daily.    . Prenatal Vit-Fe Fumarate-FA (PRENATAL VITAMINS PLUS PO) Take by mouth.    . traMADol (ULTRAM) 50 MG tablet Take 100 mg by mouth 3 (three) times daily.     [name] warfarin (COUMADIN) 4 MG tablet Take 4 mg by mouth daily.      No current facility-administered medications for this visit.     Allergies:   Review of patient's allergies indicates no known allergies.   Social History:  The patient  reports that he has never smoked. He does not have any smokeless tobacco history on file. He reports that he does not drink alcohol or use illicit drugs.   Family History:  The patient's family history includes Dementia in his sister; Heart disease in his brother; Hypertension in his mother; Stroke in his father.   ROS:  Please see the history of present illness.       All other systems reviewed and negative.    PHYSICAL EXAM: VS:  BP 130/60 mmHg  Pulse 103  Ht 6' (1.829 m)  Wt 194 lb (87.998 kg)  BMI 26.31 kg/m2 Well nourished, well developed, in no acute distress HEENT: normal Neck:  no JVD Cardiac:  normal S1, mechanical S2;  RRR; no murmur Lungs:   clear to auscultation bilaterally, no wheezing, rhonchi or rales Abd: soft, nontender, no hepatomegaly Ext:  no edema Skin: warm and dry Neuro:  CNs 2-12 intact, no focal abnormalities noted  EKG:  Sinus tachycardia with multifocal P waves, HR 103      ASSESSMENT AND PLAN:  1.  Multifocal Atrial Tachycardia:  Patient continues to have elevated heart rates. I reviewed his chart. Approximately 1 year ago, his heart rate was in the 80s. Since then, it is been in the 100s in the office. He seems to note that his symptoms have coincided with elevated heart rates. I reviewed this with Dr. Ladona Ridgel today. We considered adjusting his calcium channel blocker versus his amiodarone. Ultimately, we decided  to increase his amiodarone to 300 mg daily. 2.  Paroxysmal Atrial Fibrillation:  Continue amiodarone. Continue warfarin. He is currently not in atrial fibrillation. 3.  Dyspnea:  Suspect that this is multifactorial. He likely has an element of deconditioning as well as increased dyspnea related to his elevated heart rates. He did have an elevated BNP in the hospital. Was given 1 dose of IV Lasix.  Echocardiogram does demonstrate diastolic dysfunction. I will check a basic metabolic panel and BNP today. If his BNP remains significantly elevated, I will place him on a scheduled dose of Lasix. 4.  Aortic Insufficiency s/p Mechanical AVR:  Recent echocardiogram with well-functioning prosthesis. Continue Coumadin. Continue SBE prophylaxis. 5.  Thoracic Aortic Aneurysm s/p Bentall Procedure:  Last CT in 2014 with ascending thoracic aorta 4.7 cm  distal to replaced aortic root.  Repeat CT due 10/2014.    6.  Hypertension:  Controlled. 7.  S/p Pacemaker:  Follow-up with EP as planned. 8.  Hypothyroidism:  TSH will need to be followed closely while on amiodarone. 9.  Hx of Lower GI Bleed:  No apparent recurrence. He apparently had a diverticular bleed.  Disposition:   FU with Dr. Jens Somrenshaw later this month as planned.   Signed, Brynda RimScott Weaver, PA-C, MHS 10/27/2014 2:17 PM    [facility] [address], [place], [place]  [address] Phone: [phone]; Fax: [phone]

## 2014-10-27 NOTE — Patient Instructions (Signed)
Your physician has recommended you make the following change in your medication:  INCREASE Amiodarone to 300mg  daily (200mg  take one and one-half tablet by mouth daily)  Your physician recommends that you have lab work today: BMP and BNP  Your physician recommends that you keep your scheduled follow-up appointment with Dr Jens Somrenshaw.

## 2014-10-31 ENCOUNTER — Ambulatory Visit (INDEPENDENT_AMBULATORY_CARE_PROVIDER_SITE_OTHER): Payer: Medicare Other | Admitting: *Deleted

## 2014-10-31 DIAGNOSIS — Z7901 Long term (current) use of anticoagulants: Secondary | ICD-10-CM

## 2014-10-31 DIAGNOSIS — I359 Nonrheumatic aortic valve disorder, unspecified: Secondary | ICD-10-CM

## 2014-10-31 LAB — POCT INR: INR: 2.4

## 2014-11-06 ENCOUNTER — Emergency Department (HOSPITAL_COMMUNITY): Payer: Medicare Other

## 2014-11-06 ENCOUNTER — Inpatient Hospital Stay (HOSPITAL_COMMUNITY)
Admission: EM | Admit: 2014-11-06 | Discharge: 2014-11-09 | DRG: 292 | Disposition: A | Discharge status: Home / Self Care | Payer: Medicare Other | Attending: Family Medicine | Admitting: Family Medicine

## 2014-11-06 ENCOUNTER — Encounter (HOSPITAL_COMMUNITY): Payer: Self-pay | Admitting: Emergency Medicine

## 2014-11-06 DIAGNOSIS — Z950 Presence of cardiac pacemaker: Secondary | ICD-10-CM

## 2014-11-06 DIAGNOSIS — Z952 Presence of prosthetic heart valve: Secondary | ICD-10-CM | POA: Diagnosis not present

## 2014-11-06 DIAGNOSIS — Z8679 Personal history of other diseases of the circulatory system: Secondary | ICD-10-CM | POA: Diagnosis not present

## 2014-11-06 DIAGNOSIS — Z79899 Other long term (current) drug therapy: Secondary | ICD-10-CM | POA: Diagnosis not present

## 2014-11-06 DIAGNOSIS — T501X5A Adverse effect of loop [high-ceiling] diuretics, initial encounter: Secondary | ICD-10-CM | POA: Diagnosis present

## 2014-11-06 DIAGNOSIS — I495 Sick sinus syndrome: Secondary | ICD-10-CM | POA: Insufficient documentation

## 2014-11-06 DIAGNOSIS — Z96651 Presence of right artificial knee joint: Secondary | ICD-10-CM | POA: Diagnosis present

## 2014-11-06 DIAGNOSIS — N183 Chronic kidney disease, stage 3 (moderate): Secondary | ICD-10-CM | POA: Diagnosis present

## 2014-11-06 DIAGNOSIS — N400 Enlarged prostate without lower urinary tract symptoms: Secondary | ICD-10-CM | POA: Diagnosis present

## 2014-11-06 DIAGNOSIS — Z7901 Long term (current) use of anticoagulants: Secondary | ICD-10-CM

## 2014-11-06 DIAGNOSIS — I129 Hypertensive chronic kidney disease with stage 1 through stage 4 chronic kidney disease, or unspecified chronic kidney disease: Secondary | ICD-10-CM | POA: Diagnosis present

## 2014-11-06 DIAGNOSIS — I429 Cardiomyopathy, unspecified: Secondary | ICD-10-CM | POA: Diagnosis present

## 2014-11-06 DIAGNOSIS — R0602 Shortness of breath: Secondary | ICD-10-CM | POA: Diagnosis not present

## 2014-11-06 DIAGNOSIS — I471 Supraventricular tachycardia: Secondary | ICD-10-CM | POA: Diagnosis present

## 2014-11-06 DIAGNOSIS — I480 Paroxysmal atrial fibrillation: Secondary | ICD-10-CM | POA: Diagnosis present

## 2014-11-06 DIAGNOSIS — E039 Hypothyroidism, unspecified: Secondary | ICD-10-CM | POA: Diagnosis present

## 2014-11-06 DIAGNOSIS — I5033 Acute on chronic diastolic (congestive) heart failure: Secondary | ICD-10-CM | POA: Insufficient documentation

## 2014-11-06 DIAGNOSIS — M109 Gout, unspecified: Secondary | ICD-10-CM | POA: Diagnosis present

## 2014-11-06 DIAGNOSIS — D649 Anemia, unspecified: Secondary | ICD-10-CM | POA: Diagnosis present

## 2014-11-06 DIAGNOSIS — I5043 Acute on chronic combined systolic (congestive) and diastolic (congestive) heart failure: Secondary | ICD-10-CM | POA: Insufficient documentation

## 2014-11-06 DIAGNOSIS — R000 Tachycardia, unspecified: Secondary | ICD-10-CM | POA: Insufficient documentation

## 2014-11-06 DIAGNOSIS — Z66 Do not resuscitate: Secondary | ICD-10-CM | POA: Diagnosis present

## 2014-11-06 DIAGNOSIS — Z96659 Presence of unspecified artificial knee joint: Secondary | ICD-10-CM | POA: Diagnosis present

## 2014-11-06 DIAGNOSIS — K5900 Constipation, unspecified: Secondary | ICD-10-CM | POA: Diagnosis present

## 2014-11-06 DIAGNOSIS — N179 Acute kidney failure, unspecified: Secondary | ICD-10-CM | POA: Diagnosis present

## 2014-11-06 DIAGNOSIS — I509 Heart failure, unspecified: Secondary | ICD-10-CM

## 2014-11-06 DIAGNOSIS — I4719 Other supraventricular tachycardia: Secondary | ICD-10-CM | POA: Insufficient documentation

## 2014-11-06 DIAGNOSIS — K5909 Other constipation: Secondary | ICD-10-CM | POA: Insufficient documentation

## 2014-11-06 HISTORY — DX: Presence of cardiac pacemaker: Z95.0

## 2014-11-06 HISTORY — DX: Personal history of other diseases of the musculoskeletal system and connective tissue: Z87.39

## 2014-11-06 HISTORY — DX: Personal history of other medical treatment: Z92.89

## 2014-11-06 HISTORY — DX: Anemia, unspecified: D64.9

## 2014-11-06 HISTORY — DX: Gastrointestinal hemorrhage, unspecified: K92.2

## 2014-11-06 HISTORY — DX: Unspecified atrial fibrillation: I48.91

## 2014-11-06 HISTORY — DX: Heart failure, unspecified: I50.9

## 2014-11-06 LAB — PROTIME-INR
INR: 2.39 — ABNORMAL HIGH (ref 0.00–1.49)
Prothrombin Time: 26.3 seconds — ABNORMAL HIGH (ref 11.6–15.2)

## 2014-11-06 LAB — BASIC METABOLIC PANEL
Anion gap: 16 — ABNORMAL HIGH (ref 5–15)
BUN: 17 mg/dL (ref 6–23)
CO2: 22 mEq/L (ref 19–32)
CREATININE: 1.45 mg/dL — AB (ref 0.50–1.35)
Calcium: 9.2 mg/dL (ref 8.4–10.5)
Chloride: 102 mEq/L (ref 96–112)
GFR, EST AFRICAN AMERICAN: 49 mL/min — AB (ref >90)
GFR, EST NON AFRICAN AMERICAN: 43 mL/min — AB (ref >90)
Glucose, Bld: 102 mg/dL — ABNORMAL HIGH (ref 70–99)
Potassium: 4 mEq/L (ref 3.7–5.3)
Sodium: 140 mEq/L (ref 137–147)

## 2014-11-06 LAB — I-STAT TROPONIN, ED: Troponin i, poc: 0.03 ng/mL (ref 0.00–0.08)

## 2014-11-06 LAB — CBC
HCT: 34.1 % — ABNORMAL LOW (ref 39.0–52.0)
Hemoglobin: 10.8 g/dL — ABNORMAL LOW (ref 13.0–17.0)
MCH: 25.1 pg — AB (ref 26.0–34.0)
MCHC: 31.7 g/dL (ref 30.0–36.0)
MCV: 79.3 fL (ref 78.0–100.0)
PLATELETS: 242 10*3/uL (ref 150–400)
RBC: 4.3 MIL/uL (ref 4.22–5.81)
RDW: 17.5 % — ABNORMAL HIGH (ref 11.5–15.5)
WBC: 7.1 10*3/uL (ref 4.0–10.5)

## 2014-11-06 LAB — URINALYSIS, ROUTINE W REFLEX MICROSCOPIC
Bilirubin Urine: NEGATIVE
Glucose, UA: NEGATIVE mg/dL
Hgb urine dipstick: NEGATIVE
Ketones, ur: NEGATIVE mg/dL
LEUKOCYTES UA: NEGATIVE
Nitrite: NEGATIVE
Protein, ur: NEGATIVE mg/dL
Specific Gravity, Urine: 1.014 (ref 1.005–1.030)
UROBILINOGEN UA: 0.2 mg/dL (ref 0.0–1.0)
pH: 7.5 (ref 5.0–8.0)

## 2014-11-06 LAB — TROPONIN I: Troponin I: <0.3 ng/mL (ref <0.30)

## 2014-11-06 LAB — PRO B NATRIURETIC PEPTIDE: Pro B Natriuretic peptide (BNP): 1169 pg/mL — ABNORMAL HIGH (ref 0–450)

## 2014-11-06 MED ORDER — PANTOPRAZOLE SODIUM 40 MG PO TBEC
40.0000 mg | DELAYED_RELEASE_TABLET | Freq: Every day | ORAL | Status: DC
  Administered 2014-11-07 – 2014-11-09 (×3): 40 mg via ORAL
  Filled 2014-11-06 (×4): qty 1

## 2014-11-06 MED ORDER — SODIUM CHLORIDE 0.9 % IV SOLN: 250.0000 mL | INTRAVENOUS | Status: DC | PRN

## 2014-11-06 MED ORDER — LEVOTHYROXINE SODIUM 75 MCG PO TABS
75.0000 ug | ORAL_TABLET | Freq: Every day | ORAL | Status: DC
  Administered 2014-11-07 – 2014-11-09 (×3): 75 ug via ORAL
  Filled 2014-11-06 (×4): qty 1

## 2014-11-06 MED ORDER — AMIODARONE HCL 200 MG PO TABS
300.0000 mg | ORAL_TABLET | Freq: Every day | ORAL | Status: DC
  Administered 2014-11-07 – 2014-11-09 (×3): 300 mg via ORAL
  Filled 2014-11-06 (×4): qty 1

## 2014-11-06 MED ORDER — WARFARIN SODIUM 4 MG PO TABS
4.0000 mg | ORAL_TABLET | Freq: Once | ORAL | Status: AC
  Administered 2014-11-06: 4 mg via ORAL
  Filled 2014-11-06: qty 1

## 2014-11-06 MED ORDER — DILTIAZEM HCL ER COATED BEADS 300 MG PO CP24
300.0000 mg | ORAL_CAPSULE | Freq: Every day | ORAL | Status: DC
  Administered 2014-11-07 – 2014-11-09 (×3): 300 mg via ORAL
  Filled 2014-11-06 (×3): qty 1

## 2014-11-06 MED ORDER — ALFUZOSIN HCL ER 10 MG PO TB24
10.0000 mg | ORAL_TABLET | Freq: Every day | ORAL | Status: DC
  Administered 2014-11-07 – 2014-11-09 (×3): 10 mg via ORAL
  Filled 2014-11-06 (×3): qty 1

## 2014-11-06 MED ORDER — FINASTERIDE 5 MG PO TABS
5.0000 mg | ORAL_TABLET | Freq: Every day | ORAL | Status: DC
  Administered 2014-11-07 – 2014-11-09 (×3): 5 mg via ORAL
  Filled 2014-11-06 (×4): qty 1

## 2014-11-06 MED ORDER — WARFARIN - PHARMACIST DOSING INPATIENT: Freq: Every day | Status: DC

## 2014-11-06 MED ORDER — HYDROCODONE-ACETAMINOPHEN 5-325 MG PO TABS: 1.0000 | ORAL_TABLET | ORAL | Status: DC | PRN

## 2014-11-06 MED ORDER — TRAMADOL HCL 50 MG PO TABS
100.0000 mg | ORAL_TABLET | Freq: Three times a day (TID) | ORAL | Status: DC
  Administered 2014-11-06 – 2014-11-09 (×8): 100 mg via ORAL
  Filled 2014-11-06 (×9): qty 2

## 2014-11-06 MED ORDER — SODIUM CHLORIDE 0.9 % IJ SOLN
3.0000 mL | Freq: Two times a day (BID) | INTRAMUSCULAR | Status: DC
  Administered 2014-11-06 – 2014-11-09 (×7): 3 mL via INTRAVENOUS

## 2014-11-06 MED ORDER — SODIUM CHLORIDE 0.9 % IJ SOLN: 3.0000 mL | INTRAMUSCULAR | Status: DC | PRN

## 2014-11-06 MED ORDER — FUROSEMIDE 10 MG/ML IJ SOLN
80.0000 mg | Freq: Once | INTRAMUSCULAR | Status: AC
  Administered 2014-11-06: 80 mg via INTRAVENOUS
  Filled 2014-11-06: qty 8

## 2014-11-06 NOTE — ED Notes (Signed)
Denies chest pain 

## 2014-11-06 NOTE — Progress Notes (Signed)
ANTICOAGULATION CONSULT NOTE - Initial Consult  Pharmacy Consult:  Coumadin Indication: atrial fibrillation  No Known Allergies  Patient Measurements: Height: 6' (182.9 cm) Weight: 187 lb 6.3 oz (85 kg) IBW/kg (Calculated) : 77.6  Vital Signs: Temp: 97.4 F (36.3 C) (11/12 1542) Temp Source: Oral (11/12 1542) BP: 142/62 mmHg (11/12 1542) Pulse Rate: 81 (11/12 1542)  Labs:  Recent Labs  11/06/14 1320  HGB 10.8*  HCT 34.1*  PLT 242  LABPROT 26.3*  INR 2.39*  CREATININE 1.45*    Estimated Creatinine Clearance: 41.6 mL/min (by C-G formula based on Cr of 1.45).   Medical History: Past Medical History  Diagnosis Date  . Elevated PSA   . Aortic insufficiency   . Thoracic aortic aneurysm   . Carpal tunnel syndrome   . SVT (supraventricular tachycardia)   . Amaurosis fugax   . BPH (benign prostatic hypertrophy)   . Hypothyroidism   . Cardiac pacemaker     Medtronic  . Congestive heart failure (CHF)   . Atrial fibrillation       Assessment: 86 YOF to continue on Coumadin from PTA for history of Afib.  INR therapeutic on home regimen.  No bleeding reported.   Goal of Therapy:  INR 2-3    Plan:  - Coumadin 4mg  PO today - Daily PT / INR    Maddyn Lieurance D. Laney Potashang, PharmD, BCPS Pager:  [phone] 11/06/2014, 4:14 PM

## 2014-11-06 NOTE — H&P (Signed)
Family Medicine Teaching [facility] Admission History and Physical Service Pager: [phone]  Patient name: [name] [name] Medical record number: [id number] Date of birth: 22-Nov-1928 Age: 80 y.o. Gender: male  Primary Care Provider: Abigail Miyamoto, MD Consultants: None Code Status: DNR  Chief Complaint: weakness and exertional dyspnea  Assessment and Plan: [name] [name] is a 80 y.o. male presenting with exertional dyspnea and weakness. PMH is significant for dCHF, atrial fibrillation, s/p pacemaker, aortic valve replacement, BPH, HTN.   Weakness, Exertional dyspnea - most likely volume overload with elevated proBNP, JVD, and soft crackles on exam - Other possible etiology include ACS, developing infections process - Recent admission with similar presentation, improved slightly with lasix slightly but then showed more improvement with rate control of his Afib.  - Rule out ACS, repeat EKG in am and cycle troponins, negative X1  - EKG with t wave flattening compared to previous, non specific - Previous TTE with EF 50-55 and grade 1 diastolic dysf - Consider repeat CXR in am - IV lasix X 1, monitor for improvement - UA and culture with mild suprapubic tenderness  Afib, Hx of aortic valve replacement - rate controlled currently - EKG rate controlled with irregular irregular but varying shapes of P waves maybe showing atrial enlargment or wandering focus  - Continue Amio, diltiazem, warfarin  BPH  - continue alfuzosin   CKD stage 3a - monitor Cre, especially considering diuresis - avoid nephrotoxins  FEN/GI: KVO fluids Prophylaxis: on coumadin  Disposition: admit to tele for close monitoring and IV diuresis  History of Present Illness: [name] [name] is a 80 y.o. male presenting with progressive dyspnea and weakness for the past several days. He states that he's been having generalized weakness for past 3-4 days which worsened this morning. He also notes progressive  dyspnea over the last 2 days.he states that this morning his weakness was as bad as it had been and he went to his family doctor who sent him to the hospital for evaluation. He states that he's had physical therapy and home health nursing at home who've been working with him and he feels he is all the resources he needs at home. He has also had suprapubic discomfort for the past 2 days or so but denies dysuria, foul-smelling urine, or polyuria. He denies chest pain today.  Review Of Systems: Per HPI, Otherwise 12 point review of systems was performed and was unremarkable.  Patient Active Problem List   Diagnosis Date Noted  . SOB (shortness of breath) 11/06/2014  . Multifocal atrial tachycardia 10/19/2014  . Chronic diastolic congestive heart failure, NYHA class 2 10/17/2014  . Long term current use of anticoagulant therapy - coumadin 07/22/2014  . Syncope 11/07/2013  . Atrial fibrillation 10/16/2013  . Preop cardiovascular exam 08/12/2013  . abdomen 11/01/2012  . CARDIOMYOPATHY 09/07/2009  . AORTIC VALVE REPLACEMENT, HX OF 09/07/2009  . Hypothyroidism 09/05/2009  . AMAUROSIS FUGAX 09/05/2009  . MITRAL REGURGITATION, MILD 09/05/2009  . TRICUSPID REGURGITATION, MILD 09/05/2009  . Essential hypertension 09/05/2009  . AORTIC INSUFFICIENCY 09/05/2009  . BRADYCARDIA 09/05/2009  . CONGESTIVE HEART FAILURE UNSPECIFIED 09/05/2009  . VENTRICULAR HYPERTROPHY, LEFT, HX OF 09/05/2009  . THORACIC AORTIC ANEURYSM,HX OF 09/05/2009  . DYSPNEA, HX OF 09/05/2009  . Personal history of unspecified circulatory disease 09/05/2009  . SUPRAVENTRICULAR TACHYCARDIA, HX OF 09/05/2009  . BENIGN PROSTATIC HYPERTROPHY, HX OF 09/05/2009  . TOTAL KNEE REPLACEMENT, RIGHT, HX OF 09/05/2009  . PACEMAKER-Medtronic 09/05/2009  . PERCUTANEOUS TRANSLUMINAL CORONARY ANGIOPLASTY, HX OF 09/05/2009  .  CARPAL TUNNEL RELEASE, RIGHT, HX OF 09/05/2009   Past Medical History: Past Medical History  Diagnosis Date  . Elevated  PSA   . Aortic insufficiency   . Thoracic aortic aneurysm   . Carpal tunnel syndrome   . SVT (supraventricular tachycardia)   . Amaurosis fugax   . BPH (benign prostatic hypertrophy)   . Hypothyroidism   . Cardiac pacemaker     Medtronic  . Congestive heart failure (CHF)   . Atrial fibrillation    Past Surgical History: Past Surgical History  Procedure Laterality Date  . Insert / replace / remove pacemaker      Medtronic  . Median sternotomy    . Total knee arthroplasty      right  . Carpal tunnel release      right  . Aortic valve replacement     Social History: History  Substance Use Topics  . Smoking status: Never Smoker   . Smokeless tobacco: Not on file  . Alcohol Use: No   Additional social history: Please also refer to relevant sections of EMR.  Family History: Family History  Problem Relation Age of Onset  . Hypertension Mother   . Stroke Father   . Dementia Sister     youngest sister  . Heart disease Brother    Allergies and Medications: No Known Allergies No current facility-administered medications on file prior to encounter.   Current Outpatient Prescriptions on File Prior to Encounter  Medication Sig Dispense Refill  . alfuzosin (UROXATRAL) 10 MG 24 hr tablet Take 10 mg by mouth daily.     [name]. amiodarone (PACERONE) 200 MG tablet Take one and one-half tablet by mouth daily (Patient taking differently: Take 300 mg by mouth daily. Take one and one-half tablet by mouth daily) 45 tablet 3  . diltiazem (CARDIZEM CD) 300 MG 24 hr capsule Take 1 capsule (300 mg total) by mouth daily. 30 capsule 1  . finasteride (PROSCAR) 5 MG tablet Take 5 mg by mouth daily.    . Lactobacillus (ACIDOPHILUS PO) Take 1 capsule by mouth 2 (two) times daily.    [name]. levothyroxine (SYNTHROID, LEVOTHROID) 75 MCG tablet Take 75 mcg by mouth daily before breakfast.    . omeprazole (PRILOSEC) 20 MG capsule Take 20 mg by mouth daily.    . Prenatal Vit-Fe Fumarate-FA (PRENATAL VITAMINS  PLUS PO) Take 1 tablet by mouth daily.     . traMADol (ULTRAM) 50 MG tablet Take 100 mg by mouth 3 (three) times daily.     [name]. warfarin (COUMADIN) 4 MG tablet Take 4 mg by mouth daily.       Objective: BP 144/57 mmHg  Pulse 80  Temp(Src) 98.7 F (37.1 C) (Oral)  Resp 21  Ht 6' (1.829 m)  Wt 186 lb (84.369 kg)  BMI 25.22 kg/m2  SpO2 99% Exam: Gen: NAD, alert, cooperative with exam HEENT: NCAT, + JVD CV: RRR, good S1/S2, no murmur Resp: mildly labored, soft crackles at the right base Abd: soft, mild tenderness to palpation of the suprapubic area, no guarding Ext: No edema, 1+ DP pulses Neuro: Alert and oriented, No gross deficits   Labs and Imaging: CBC BMET   Recent Labs Lab 11/06/14 1320  WBC 7.1  HGB 10.8*  HCT 34.1*  PLT 242    Recent Labs Lab 11/06/14 1320  NA 140  K 4.0  CL 102  CO2 22  BUN 17  CREATININE 1.45*  GLUCOSE 102*  CALCIUM 9.2     Troponin  0.03 ProBNP 1169  Note that baseline creatinine is 1.5  EKG 11/06/2014: Irregularly irregular rhythm, varying shapes of P-wave, T wave flattening in leads 3 and aVF, left axis deviation  Chest x-ray 11/06/2014: IMPRESSION: There is no evidence of CHF nor other acute cardiopulmonary abnormality.  Elenora GammaSamuel L Chery Giusto, MD 11/06/2014, 3:31 PM PGY-3, [facility] FPTS Intern pager: [phone], text pages welcome

## 2014-11-06 NOTE — ED Notes (Signed)
80 yo male from PCP for routine B12 shot, disscussed with Primary the feeling of weakness and SOB since this am. Recently d/c from here with New onset of CHF/Afib. On lasix at home. Pt sats 97 of room air, lungs clear in bases, A/OX4. NAD. Vitals stable.

## 2014-11-06 NOTE — ED Notes (Signed)
Attempted report x1. 

## 2014-11-06 NOTE — ED Provider Notes (Signed)
CSN: [id number]     Arrival date & time 11/06/14  1224 History   First MD Initiated Contact with Patient 11/06/14 1244     Chief Complaint  Patient presents with  . Shortness of Breath     (Consider location/radiation/quality/duration/timing/severity/associated sxs/prior Treatment) HPI Comments: Patient presents to the emergency department with chief complaint of shortness of breath. He was recently admitted for new onset CHF and A. Fib. He states that over the past couple of days, he has had increased shortness of breath. He states that it is worse with exertion. He denies any chest pain, arm pain, jaw pain. He reports feeling weak and tired. He states that he takes Lasix as needed based on body weight. He is taking Coumadin for A. Fib. He denies any fevers, chills, cough, nausea, vomiting, diarrhea, or constipation.  The history is provided by the patient. No language interpreter was used.    Past Medical History  Diagnosis Date  . Elevated PSA   . Aortic insufficiency   . Thoracic aortic aneurysm   . Carpal tunnel syndrome   . SVT (supraventricular tachycardia)   . Amaurosis fugax   . BPH (benign prostatic hypertrophy)   . Hypothyroidism   . Cardiac pacemaker     Medtronic  . Congestive heart failure (CHF)   . Atrial fibrillation    Past Surgical History  Procedure Laterality Date  . Insert / replace / remove pacemaker      Medtronic  . Median sternotomy    . Total knee arthroplasty      right  . Carpal tunnel release      right  . Aortic valve replacement     Family History  Problem Relation Age of Onset  . Hypertension Mother   . Stroke Father   . Dementia Sister     youngest sister  . Heart disease Brother    History  Substance Use Topics  . Smoking status: Never Smoker   . Smokeless tobacco: Not on file  . Alcohol Use: No    Review of Systems  Constitutional: Negative for fever and chills.  Respiratory: Positive for shortness of breath.    Cardiovascular: Negative for chest pain.  Gastrointestinal: Negative for nausea, vomiting, diarrhea and constipation.  Genitourinary: Negative for dysuria.  All other systems reviewed and are negative.     Allergies  Review of patient's allergies indicates no known allergies.  Home Medications   Prior to Admission medications   Medication Sig Start Date End Date Taking? Authorizing Provider  alfuzosin (UROXATRAL) 10 MG 24 hr tablet Take 10 mg by mouth daily.     Historical Provider, MD  amiodarone (PACERONE) 200 MG tablet Take one and one-half tablet by mouth daily 10/27/14   Beatrice LecherScott T Weaver, PA-C  diltiazem (CARDIZEM CD) 300 MG 24 hr capsule Take 1 capsule (300 mg total) by mouth daily. 10/21/14   Kathee DeltonIan D McKeag, MD  finasteride (PROSCAR) 5 MG tablet Take 5 mg by mouth daily.    Historical Provider, MD  Lactobacillus (ACIDOPHILUS PO) Take 1 capsule by mouth 2 (two) times daily.    Historical Provider, MD  levothyroxine (SYNTHROID, LEVOTHROID) 75 MCG tablet Take 75 mcg by mouth daily before breakfast.    Historical Provider, MD  omeprazole (PRILOSEC) 20 MG capsule Take 20 mg by mouth daily.    Historical Provider, MD  Prenatal Vit-Fe Fumarate-FA (PRENATAL VITAMINS PLUS PO) Take by mouth.    Historical Provider, MD  traMADol (ULTRAM) 50 MG tablet Take  100 mg by mouth 3 (three) times daily.  04/18/14   Historical Provider, MD  warfarin (COUMADIN) 4 MG tablet Take 4 mg by mouth daily.     Historical Provider, MD   BP 127/62 mmHg  Pulse 110  Temp(Src) 98.7 F (37.1 C) (Oral)  Resp 25  Ht 6' (1.829 m)  Wt 186 lb (84.369 kg)  BMI 25.22 kg/m2  SpO2 98% Physical Exam  Constitutional: He is oriented to person, place, and time. He appears well-developed and well-nourished.  HENT:  Head: Normocephalic and atraumatic.  Eyes: Conjunctivae and EOM are normal. Pupils are equal, round, and reactive to light. Right eye exhibits no discharge. Left eye exhibits no discharge. No scleral icterus.   Neck: Normal range of motion. Neck supple. No JVD present.  Cardiovascular: Normal rate, regular rhythm and normal heart sounds.  Exam reveals no gallop and no friction rub.   No murmur heard. Pulmonary/Chest: Effort normal and breath sounds normal. No respiratory distress. He has no wheezes. He has no rales. He exhibits no tenderness.  Abdominal: Soft. He exhibits no distension and no mass. There is no tenderness. There is no rebound and no guarding.  Musculoskeletal: Normal range of motion. He exhibits no edema or tenderness.  Neurological: He is alert and oriented to person, place, and time.  Skin: Skin is warm and dry.  Psychiatric: He has a normal mood and affect. His behavior is normal. Judgment and thought content normal.  Nursing note and vitals reviewed.   ED Course  Procedures (including critical care time) Results for orders placed or performed during the hospital encounter of 11/06/14  BNP (order ONLY if patient complains of dyspnea/SOB AND you have documented it for THIS visit)  Result Value Ref Range   Pro B Natriuretic peptide (BNP) 1169.0 (H) 0 - 450 pg/mL  Basic metabolic panel  Result Value Ref Range   Sodium 140 137 - 147 mEq/L   Potassium 4.0 3.7 - 5.3 mEq/L   Chloride 102 96 - 112 mEq/L   CO2 22 19 - 32 mEq/L   Glucose, Bld 102 (H) 70 - 99 mg/dL   BUN 17 6 - 23 mg/dL   Creatinine, Ser 1.61 (H) 0.50 - 1.35 mg/dL   Calcium 9.2 8.4 - 09.6 mg/dL   GFR calc non Af Amer 43 (L) >90 mL/min   GFR calc Af Amer 49 (L) >90 mL/min   Anion gap 16 (H) 5 - 15  CBC  Result Value Ref Range   WBC 7.1 4.0 - 10.5 K/uL   RBC 4.30 4.22 - 5.81 MIL/uL   Hemoglobin 10.8 (L) 13.0 - 17.0 g/dL   HCT 04.5 (L) 40.9 - 81.1 %   MCV 79.3 78.0 - 100.0 fL   MCH 25.1 (L) 26.0 - 34.0 pg   MCHC 31.7 30.0 - 36.0 g/dL   RDW 91.4 (H) 78.2 - 95.6 %   Platelets 242 150 - 400 K/uL  Protime-INR (if pt is taking Coumadin)  Result Value Ref Range   Prothrombin Time 26.3 (H) 11.6 - 15.2 seconds    INR 2.39 (H) 0.00 - 1.49  I-stat troponin, ED (not at [facility])  Result Value Ref Range   Troponin i, poc 0.03 0.00 - 0.08 ng/mL   Comment 3           Dg Chest 2 View  10/17/2014   CLINICAL DATA:  80 year old male with 3- 4 day history of lethargy, dizziness and shortness of breath  EXAM: CHEST  2  VIEW  COMPARISON:  Prior chest x-ray 09/17/2014  FINDINGS: Stable cardiomegaly with left ventricular prominence. Mediastinal contours are also within normal limits. Prosthetic aortic valve. Left subclavian cardiac rhythm maintenance device with leads in unchanged position overlying the right atrium and right ventricle. Mild central bronchitic change similar compared to prior. Lingular atelectasis versus scarring is also unchanged. No pulmonary edema, effusion, pneumothorax or focal airspace consolidation. No acute osseous abnormality. Multilevel degenerative spurring throughout the spine. Bilateral glenohumeral joint osteoarthritis.  IMPRESSION: Stable chest x-ray without evidence of acute cardiopulmonary process.   Electronically Signed   By: Malachy MoanHeath  McCullough M.D.   On: 10/17/2014 16:23   Ct Head Wo Contrast  10/17/2014   CLINICAL DATA:  Fall.  Not feeling well.  EXAM: CT HEAD WITHOUT CONTRAST  TECHNIQUE: Contiguous axial images were obtained from the base of the skull through the vertex without intravenous contrast.  COMPARISON:  10/23/2013  FINDINGS: Mild chronic microvascular ischemic changes in the periventricular white matter are stable. Small mega cisterna magna again noted, an incidental finding. Negative for intra or extra-axial hemorrhage, mass effect, mass lesion, or evidence of acute cortically based infarction. Atherosclerotic calcification of the vertebral arteries and basilar artery noted. The skull is intact. Visualized paranasal sinuses and mastoid air cells are clear. Leftward directed spur on the nasal septum.  IMPRESSION: No acute intracranial abnormality.   Electronically Signed   By: Britta MccreedySusan   Turner M.D.   On: 10/17/2014 16:24   Dg Chest Port 1 View  11/06/2014   CLINICAL DATA:  New onset of atrial fibrillation with sensation of weakness and shortness of breath beginning this morning; recent hospitalization  EXAM: PORTABLE CHEST - 1 VIEW  COMPARISON:  PA and lateral chest of October 17, 2014  FINDINGS: The lungs are mildly hyperinflated and clear. The cardiac silhouette is mildly enlarged but stable. The pulmonary vascularity is normal. The permanent pacemaker is unchanged in appearance. The patient has undergone previous aortic valve replacement. There are 8 intact sternal wires. There are degenerative changes of the shoulders.  IMPRESSION: There is no evidence of CHF nor other acute cardiopulmonary abnormality.   Electronically Signed   By: David  SwazilandJordan   On: 11/06/2014 13:37      EKG Interpretation   Date/Time:  Thursday November 06 2014 12:38:31 EST Ventricular Rate:  79 PR Interval:  187 QRS Duration: 105 QT Interval:  401 QTC Calculation: 460 R Axis:   -37 Text Interpretation:  Unknown rhythm, irregular rate Left axis deviation  Borderline T wave abnormalities Artifact Confirmed by ZACKOWSKI  MD, SCOTT  ([id number]) on 11/06/2014 1:01:27 PM      MDM   Final diagnoses:  SOB (shortness of breath)    Patient with shortness of breath. Recently admitted to internal medicine for new onset CHF and A. Fib. We'll check basic labs, chest x-ray, and EKG. Will reevaluate.  BNP is elevated. Patient is still having some shortness of breath. Denies any increase in weight. Patient seen by and discussed with Dr. Deretha EmoryZackowski. Plan for admission to medicine team.    Roxy Horsemanobert Cashis Rill, PA-C 11/06/14 1511  Vanetta MuldersScott Zackowski, MD 11/08/14 [phone]

## 2014-11-06 NOTE — Progress Notes (Signed)
PT Cancellation Note  Patient Details Name: [name] [name] Carbonneau MRN: [id number] DOB: 04/24/1928   Cancelled Treatment:    Reason Eval/Treat Not Completed: Patient not medically ready. Per MD orders, pt on bed rest until Friday 11/07/14. Will follow up for PT eval when able.   Conni SlipperKirkman, Geraldyne Barraclough 11/06/2014, 3:44 PM   Conni SlipperLaura Marny Smethers, PT, DPT Acute Rehabilitation Services Pager: [phone]

## 2014-11-07 ENCOUNTER — Encounter (HOSPITAL_COMMUNITY): Payer: Self-pay | Admitting: Physician Assistant

## 2014-11-07 DIAGNOSIS — N183 Chronic kidney disease, stage 3 (moderate): Secondary | ICD-10-CM

## 2014-11-07 DIAGNOSIS — I471 Supraventricular tachycardia: Secondary | ICD-10-CM

## 2014-11-07 DIAGNOSIS — I5043 Acute on chronic combined systolic (congestive) and diastolic (congestive) heart failure: Secondary | ICD-10-CM | POA: Insufficient documentation

## 2014-11-07 DIAGNOSIS — R000 Tachycardia, unspecified: Secondary | ICD-10-CM | POA: Insufficient documentation

## 2014-11-07 DIAGNOSIS — I509 Heart failure, unspecified: Secondary | ICD-10-CM

## 2014-11-07 DIAGNOSIS — R0602 Shortness of breath: Secondary | ICD-10-CM

## 2014-11-07 LAB — PROTIME-INR
INR: 2.37 — AB (ref 0.00–1.49)
Prothrombin Time: 26.1 seconds — ABNORMAL HIGH (ref 11.6–15.2)

## 2014-11-07 LAB — TROPONIN I

## 2014-11-07 LAB — CBC
HCT: 33.2 % — ABNORMAL LOW (ref 39.0–52.0)
Hemoglobin: 10.5 g/dL — ABNORMAL LOW (ref 13.0–17.0)
MCH: 24.7 pg — ABNORMAL LOW (ref 26.0–34.0)
MCHC: 31.6 g/dL (ref 30.0–36.0)
MCV: 78.1 fL (ref 78.0–100.0)
Platelets: 242 10*3/uL (ref 150–400)
RBC: 4.25 MIL/uL (ref 4.22–5.81)
RDW: 17.9 % — ABNORMAL HIGH (ref 11.5–15.5)
WBC: 6.3 10*3/uL (ref 4.0–10.5)

## 2014-11-07 LAB — URINE CULTURE
Colony Count: NO GROWTH
Culture: NO GROWTH

## 2014-11-07 LAB — BASIC METABOLIC PANEL
ANION GAP: 16 — AB (ref 5–15)
BUN: 21 mg/dL (ref 6–23)
CHLORIDE: 101 meq/L (ref 96–112)
CO2: 24 mEq/L (ref 19–32)
Calcium: 8.9 mg/dL (ref 8.4–10.5)
Creatinine, Ser: 1.86 mg/dL — ABNORMAL HIGH (ref 0.50–1.35)
GFR calc non Af Amer: 32 mL/min — ABNORMAL LOW (ref >90)
GFR, EST AFRICAN AMERICAN: 37 mL/min — AB (ref >90)
Glucose, Bld: 109 mg/dL — ABNORMAL HIGH (ref 70–99)
POTASSIUM: 4.1 meq/L (ref 3.7–5.3)
SODIUM: 141 meq/L (ref 137–147)

## 2014-11-07 MED ORDER — WARFARIN SODIUM 4 MG PO TABS
4.0000 mg | ORAL_TABLET | Freq: Once | ORAL | Status: AC
  Administered 2014-11-07: 4 mg via ORAL
  Filled 2014-11-07: qty 1

## 2014-11-07 MED ORDER — FUROSEMIDE 10 MG/ML IJ SOLN
40.0000 mg | Freq: Two times a day (BID) | INTRAMUSCULAR | Status: AC
  Administered 2014-11-07 (×2): 40 mg via INTRAVENOUS
  Filled 2014-11-07: qty 4

## 2014-11-07 NOTE — Evaluation (Signed)
Occupational Therapy Evaluation and Discharge Patient Details Name: Maryelizabeth KaufmannGattis Garbers MRN: [id number] DOB: 02/15/1928 Today's Date: 11/07/2014    History of Present Illness [name] [name] is a 80 y.o. male presenting with exertional dyspnea and weakness. PMH is significant for dCHF, atrial fibrillation, s/p pacemaker, aortic valve replacement, BPH, HTN.   Clinical Impression   This [age] yo male admitted with above presents to acute OT at a min guard A level for BADLs and reports that he has this at home and that his wife sometimes helps him with BADLs. His O2 sats on room air ranged from 95%-97% during session; however HR at times up into the 130's. No further OT needs, we will sign off.    Follow Up Recommendations  No OT follow up    Equipment Recommendations  None recommended by OT       Precautions / Restrictions Precautions Precautions: Fall Restrictions Weight Bearing Restrictions: No      Mobility Bed Mobility Overal bed mobility: Modified Independent Bed Mobility: Supine to Sit     Supine to sit: Modified independent (Device/Increase time)     General bed mobility comments: received sitting EOB, patient scooting to EOB for standing without assist  Transfers Overall transfer level: Needs assistance Equipment used: Straight cane   Sit to Stand: Min assist         General transfer comment: VCs for hand placement and safety, min assist for stability in coming to standing, performed from various surfaces including bed and BSC    Balance Overall balance assessment: History of Falls;Needs assistance Sitting-balance support: No upper extremity supported;Feet supported Sitting balance-Leahy Scale: Good     Standing balance support: Single extremity supported;During functional activity Standing balance-Leahy Scale: Fair                              ADL Overall ADL's : Needs assistance/impaired Eating/Feeding: Independent;Sitting   Grooming:  Supervision/safety;Standing;Oral care   Upper Body Bathing: Sitting;Set up   Lower Body Bathing: Min guard;Sit to/from stand   Upper Body Dressing : Set up;Sitting   Lower Body Dressing: Min guard;Sit to/from stand   Toilet Transfer: Min guard;Ambulation;RW;BSC (over [employer]toliet)   Toileting- ArchitectClothing Manipulation and Hygiene: Min guard;Sit to/from stand                         Pertinent Vitals/Pain Pain Assessment: No/denies pain     Hand Dominance Right   Extremity/Trunk Assessment Upper Extremity Assessment Upper Extremity Assessment: Overall WFL for tasks assessed   Lower Extremity Assessment Lower Extremity Assessment: Generalized weakness LLE Deficits / Details: Left knee + arthritic changes with noted deformity, patient reports increased pain with ambulation LLE Coordination: decreased gross motor   Cervical / Trunk Assessment Cervical / Trunk Assessment: Kyphotic   Communication Communication Communication: No difficulties   Cognition Arousal/Alertness: Awake/alert Behavior During Therapy: WFL for tasks assessed/performed Overall Cognitive Status: Within Functional Limits for tasks assessed                                Home Living Family/patient expects to be discharged to:: Private residence Living Arrangements: Spouse/significant other Available Help at Discharge: Family;Available 24 hours/day Type of Home: House Home Access: Stairs to enter Entergy CorporationEntrance Stairs-Number of Steps: 2 Entrance Stairs-Rails: None Home Layout: One level  Home Equipment: Gilmer MorCane - single point;Bedside commode;Toilet riser;Shower seat - built in;Hand held shower head          Prior Functioning/Environment Level of Independence: Needs assistance  Gait / Transfers Assistance Needed: Uses cane or RW, depending on how he feels. ADL's / Homemaking Assistance Needed: assist with bathing/dressing at times        OT Diagnosis: Generalized  weakness         OT Goals(Current goals can be found in the care plan section) Acute Rehab OT Goals Patient Stated Goal: not stated  OT Frequency:             Co-evaluation PT/OT/SLP Co-Evaluation/Treatment: Yes (partial)     OT goals addressed during session: ADL's and self-care;Strengthening/ROM      End of Session Equipment Utilized During Treatment: Gait belt;Rolling walker  Activity Tolerance: Patient tolerated treatment well Patient left: in chair;with call bell/phone within reach;with chair alarm set   Time: [id number] OT Time Calculation (min): 26 min Charges:  OT General Charges $OT Visit: 1 Procedure OT Evaluation $Initial OT Evaluation Tier I: 1 Procedure OT Treatments $Self Care/Home Management : 8-22 mins  Evette GeorgesLeonard, Andras Grunewald Eva [phone] 11/07/2014, 9:03 AM

## 2014-11-07 NOTE — Discharge Summary (Signed)
Family Medicine [facility] Discharge Summary  Patient name: [name] [name] Medical record number: [id number] Date of birth: 10/21/1928 Age: 80 y.o. Gender: male Date of Admission: 11/06/2014  Date of Discharge: 11/09/14  Admitting Physician: Leighton Roachodd D McDiarmid, MD  Primary Care Provider: Abigail MiyamotoPERRY,LAWRENCE EDWARD, MD Consultants: None  Indication for Hospitalization: Dyspnea  Discharge Diagnoses/Problem List:  Diastolic CHF exacerbation  Atrial fibrillation Multifocal atrial tachycardia H/o aortic valve replacement BPH CKD stage 3a  Disposition: Home with 24hr supervision of wife/daughter   Discharge Condition: Stable  Discharge Exam:  Blood pressure 127/55, pulse 60, temperature 98.1 F (36.7 C), temperature source Oral, resp. rate 16, height 6' (1.829 m), weight 184 lb 3.2 oz (83.553 kg), SpO2 99 %. Gen: NAD, cooperative, sitting up, conversant CV: Regular rate, irregularly irregular, no murmur, rubs, or gallops noted Resp: Good air movement with normal work of breathing. CTAB with no crackles, wheezing or rhonchi noted Abd: +BS, soft, ND/NT Ext: No edema, non tender 1+ DP pulses Neuro: Alert and oriented, No gross deficits  Brief Hospital Course:  Mr. Cathie HoopsLanglely is an 80 y/o male with a PMHx of dCHF, afib s/p pacemaker, aortic valve replacement, BPH, and HTN presenting with progressively worsening exertional dyspnea and weakness. Per patient, he's had some SOB since his discharge on 10/27 (due to MAT), however it became progressively worse over the last 3 days( days as well as some suprapubic pressure without urinary complaints.   On admission, he was noted to have an elevated pBNP to 1169, he had JVD, and soft crackles on exam, however CXR did not show any evidence of CHF. He received Lasix 80mg  IV with some improvement. The subsequent day he was started on Lasix 40mg  IV BID and responded quite well, with improved SOB and strength. He was transitioned to Lasix 40mg  PO  daily and was stable prior to discharged, with continued diuresis and no respiratory complaints.  Additionally,  PT saw the patient and felt he'd benefit from a rolling walker and 24hr supervision at home  (pt with wife and daughter at home 24/7)  which was set-up prior to discharge.   A U/A was obtained due to his suprapubic pain however this was negative for LE and nitrites as well as a urine culture. On the day of discharge, he denied any abdominal pain but felt "distended" secondary to constipation which was resolved with a combination of MiraLax and a soap suds enema.   He was continued on amiodarone, diltiazem, and warfarin for Afib and afluzosin for BPH.   Issues for Follow Up:  -- There was some thought of changing dilt/adding a beta blocker for better HR control, however pt's HR improved during the hospitalization. May consider this alternative in the future if tachycardia continues -- Continue to f/u the patient's constipation, as he was sent out on MiraLax PRN.   Significant Procedures: None   Significant Labs and Imaging:   Recent Labs Lab 11/06/14 1320 11/07/14 0310  WBC 7.1 6.3  HGB 10.8* 10.5*  HCT 34.1* 33.2*  PLT 242 242    Recent Labs Lab 11/06/14 1320 11/07/14 0310 11/08/14 0403 11/09/14 0350  NA 140 141 139 133*  K 4.0 4.1 3.5* 3.6*  CL 102 101 98 95*  CO2 22 24 25 25   GLUCOSE 102* 109* 98 92  BUN 17 21 24* 23  CREATININE 1.45* 1.86* 1.80* 1.66*  CALCIUM 9.2 8.9 8.7 8.7   PBNP: 1169 Troponin neg x 3 INR 2.39 U/A: Negative for nitrite and LE  CXR: There is no evidence of CHF nor other acute cardiopulmonaryabnormality.  EKG 11/06/2014: Irregularly irregular rhythm, multifocal atrial tachycarida., T wave flattening in leads 3 and aVF, left axis deviation  Results/Tests Pending at Time of Discharge: None  Discharge Medications:    Medication List    TAKE these medications        ACIDOPHILUS PO  Take 1 capsule by mouth 2 (two) times daily.      alfuzosin 10 MG 24 hr tablet  Commonly known as:  UROXATRAL  Take 10 mg by mouth daily.     amiodarone 200 MG tablet  Commonly known as:  PACERONE  Take one and one-half tablet by mouth daily     diltiazem 300 MG 24 hr capsule  Commonly known as:  CARDIZEM CD  Take 1 capsule (300 mg total) by mouth daily.     finasteride 5 MG tablet  Commonly known as:  PROSCAR  Take 5 mg by mouth daily.     furosemide 40 MG tablet  Commonly known as:  LASIX  Take 1 tablet (40 mg total) by mouth daily.     levothyroxine 75 MCG tablet  Commonly known as:  SYNTHROID, LEVOTHROID  Take 75 mcg by mouth daily before breakfast.     omeprazole 20 MG capsule  Commonly known as:  PRILOSEC  Take 20 mg by mouth daily.     polyethylene glycol packet  Commonly known as:  MIRALAX / GLYCOLAX  Take 17 g by mouth daily as needed. Until stooling regularly     PRENATAL VITAMINS PLUS PO  Take 1 tablet by mouth daily.     traMADol 50 MG tablet  Commonly known as:  ULTRAM  Take 100 mg by mouth 3 (three) times daily.     warfarin 4 MG tablet  Commonly known as:  COUMADIN  Take 4 mg by mouth daily.        Discharge Instructions: Please refer to Patient Instructions section of EMR for full details.  Patient was counseled important signs and symptoms that should prompt return to medical care, changes in medications, dietary instructions, activity restrictions, and follow up appointments.   Follow-Up Appointments: Follow-up Information    [facility]   Specialty:  Home Health Services   Why:  home health physical and occupational therapy, nurse and social worker   Contact information:   [address] [place] [address] [phone]       Follow up with Abigail MiyamotoPERRY,LAWRENCE EDWARD, MD. Call in 1 day.   Specialty:  Family Medicine   Why:  to make a hospital follow up appointment   Contact information:   [address] [place] [address] [phone]       Follow  up with Olga MillersBrian Crenshaw, MD. Call in 1 day.   Specialty:  Cardiology   Why:  to make a follow up appointment for 1-2weeks   Contact information:   [address] [place] [address] [phone]       Joanna Puffrystal S Rodneshia Greenhouse, MD 11/09/2014, 6:44 PM PGY-1, [facility]

## 2014-11-07 NOTE — Evaluation (Signed)
Physical Therapy Evaluation Patient Details Name: [name] [name] MRN: [id number] DOB: 07/14/1928 Today's Date: 11/07/2014   History of Present Illness  [name] [name] is a 80 y.o. male presenting with exertional dyspnea and weakness. PMH is significant for dCHF, atrial fibrillation, s/p pacemaker, aortic valve replacement, BPH, HTN.  Clinical Impression  Patient demonstrates deficits in functional mobility as indicated below. Will need continued skilled PT to address deficits and maximize function. Will see as indicated and progress as tolerated. Recommend HHPT and use of RW upon acute discharge. OF NOTE: HR elevated to upper 130s with significant fluctuation from afib during minimal ambulation    Follow Up Recommendations Home health PT;Supervision/Assistance - 24 hour    Equipment Recommendations  Rolling walker with 5" wheels (patient will need RW for discharge.)    Recommendations for Other Services       Precautions / Restrictions Precautions Precautions: Fall Restrictions Weight Bearing Restrictions: No      Mobility  Bed Mobility               General bed mobility comments: received sitting EOB, patient scooting to EOB for standing without assist  Transfers Overall transfer level: Needs assistance Equipment used: Straight cane   Sit to Stand: Min assist         General transfer comment: VCs for hand placement and safety, min assist for stability in coming to standing, performed from various surfaces including bed and BSC  Ambulation/Gait Ambulation/Gait assistance: Min guard Ambulation Distance (Feet): 140 Feet Assistive device: Straight cane Gait Pattern/deviations: Step-through pattern;Decreased step length - right;Decreased step length - left;Decreased stride length;Decreased weight shift to left;Trunk flexed Gait velocity: Decreased Gait velocity interpretation: Below normal speed for age/gender General Gait Details: Cues to try to stand upright and  look forward during gait.  Patient with flexed posture.  Patient with steady, safe gait speed today.  HR at 105 with ambulation.  Dyspnea 2/4.  Stairs            Wheelchair Mobility    Modified Rankin (Stroke Patients Only)       Balance Overall balance assessment: History of Falls                                           Pertinent Vitals/Pain      Home Living Family/patient expects to be discharged to:: Private residence Living Arrangements: Spouse/significant other Available Help at Discharge: Family;Available 24 hours/day Type of Home: House Home Access: Stairs to enter Entrance Stairs-Rails: None Entrance Stairs-Number of Steps: 2 Home Layout: One level Home Equipment: Cane - single point;Bedside commode;Shower seat;Toilet riser      Prior Function Level of Independence: Needs assistance   Gait / Transfers Assistance Needed: Uses cane or RW, depending on how he feels.  ADL's / Homemaking Assistance Needed: assist with bathing/dressing at times        Hand Dominance        Extremity/Trunk Assessment               Lower Extremity Assessment: Generalized weakness   LLE Deficits / Details: Left knee + arthritic changes with noted deformity, patient reports increased pain with ambulation  Cervical / Trunk Assessment: Kyphotic  Communication   Communication: No difficulties  Cognition Arousal/Alertness: Awake/alert Behavior During Therapy: WFL for tasks assessed/performed Overall Cognitive Status: Within Functional Limits for tasks assessed  General Comments General comments (skin integrity, edema, etc.): dynamic balance in sitting performed without assist    Exercises        Assessment/Plan    PT Assessment Patient needs continued PT services  PT Diagnosis Difficulty walking;Abnormality of gait;Generalized weakness   PT Problem List Decreased strength;Decreased activity tolerance;Decreased  balance;Decreased mobility;Cardiopulmonary status limiting activity  PT Treatment Interventions DME instruction;Gait training;Functional mobility training;Therapeutic activities;Therapeutic exercise;Balance training;Patient/family education   PT Goals (Current goals can be found in the Care Plan section) Acute Rehab PT Goals Patient Stated Goal: not stated PT Goal Formulation: With patient/family Time For Goal Achievement: 10/26/14 Potential to Achieve Goals: Good    Frequency Min 3X/week   Barriers to discharge        Co-evaluation PT/OT/SLP Co-Evaluation/Treatment:  (Dove tailed session)             End of Session Equipment Utilized During Treatment: Gait belt Activity Tolerance: Patient tolerated treatment well Patient left: in chair;with call bell/phone within reach;with chair alarm set Nurse Communication: Mobility status (Encouraged ambulation in hallway with nursing.)         Time: [phone] PT Time Calculation (min) (ACUTE ONLY): 24 min   Charges:   PT Evaluation $Initial PT Evaluation Tier I: 1 Procedure PT Treatments $Gait Training: 8-22 mins   PT G CodesFabio [name]:          [name] [name] 11/07/2014, 8:53 AM [name] [name], PT DPT  [phone]

## 2014-11-07 NOTE — Progress Notes (Signed)
ANTICOAGULATION CONSULT NOTE - Follow up  Pharmacy Consult:  Coumadin Indication: atrial fibrillation  No Known Allergies  Patient Measurements: Height: 6' (182.9 cm) Weight: 184 lb 11.2 oz (83.779 kg) (Scale A) IBW/kg (Calculated) : 77.6  Vital Signs: Temp: 97.9 F (36.6 C) (11/13 1409) Temp Source: Oral (11/13 1409) BP: 114/50 mmHg (11/13 1409) Pulse Rate: 59 (11/13 1409)  Labs:  Recent Labs  11/06/14 1320 11/06/14 1600 11/06/14 2308 11/07/14 0310  HGB 10.8*  --   --  10.5*  HCT 34.1*  --   --  33.2*  PLT 242  --   --  242  LABPROT 26.3*  --   --  26.1*  INR 2.39*  --   --  2.37*  CREATININE 1.45*  --   --  1.86*  TROPONINI  --  <0.30 <0.30 <0.30    Estimated Creatinine Clearance: 32.4 mL/min (by C-G formula based on Cr of 1.86).   Medical History: Past Medical History  Diagnosis Date  . Elevated PSA   . Aortic insufficiency   . Thoracic aortic aneurysm   . Carpal tunnel syndrome   . SVT (supraventricular tachycardia)   . Amaurosis fugax   . BPH (benign prostatic hypertrophy)   . Hypothyroidism   . Congestive heart failure (CHF)   . Atrial fibrillation   . Presence of permanent cardiac pacemaker     Medtronic  . Lower GI bleeding ~ 08/2014  . History of blood transfusion ~ 08/2014    related to LGIB  . Anemia   . History of gout       Assessment: [age] YOF admitted 11/06/14 with CHF exacerbation and afib. He was continued on Coumadin from PTA for history of Afib . Also with h/o s/p mech AVR 2006.   INR is 2.37 today, remains therapeutic on home regimen.  No bleeding reported. Anemia - H&H lower than at d/c 10/27.   Home coumadin regimen: 4mg  daily PTA, last dose 11/05/14. Amio started for afib 10/15/2014 at an office visit. [facility]. Dr. Shirlee LatchMcLean reports that patient is compliant with medications and dietary restrictions  .   Goal of Therapy:  INR 2-3    Plan:  - Coumadin 4mg  PO today - Daily PT / INR    Noah Delaineuth Janeka Libman,  RPh Clinical Pharmacist Pager: [phone] Pager:  [phone] 11/07/2014, 4:33 PM

## 2014-11-07 NOTE — Progress Notes (Signed)
Utilization review completed. Orvella Digiulio, RN, BSN. 

## 2014-11-07 NOTE — Progress Notes (Signed)
Family Medicine Teaching Service Daily Progress Note Intern Pager: [phone]  Patient name: [name] [name] Medical record number: [id number] Date of birth: 11/21/1928 Age: 80 y.o. Gender: male  Primary Care Provider: Abigail MiyamotoPERRY,LAWRENCE EDWARD, MD Consultants: Cardiology Code Status: DNR  Pt Overview and Major Events to Date:  11/12: Presents with progressive dyspnea and weakness for several days.   Assessment and Plan: [name] [name] is a 80 y/o male presenting with exertional dyspnea and weakness. PMH is significant for diastolic CHF, atrial fibrillation s/p pacemake, aortic valve replacement, BPH, and HTN.  #Weakness, Exertional dyspnea: most likely volume overload with elevated proBNP, JVD, and soft crackles on exam.  Other possible etiology include ACS (less likely) or developing infections process. Recent admission with similar presentation, improved slightly with lasix, and then showed more improvement with rate control of his Afib.  - Rule out ACS: troponins neg x 3, repeat EKG pending   - EKG with t wave flattening compared to previous, non specific - Previous TTE with EF 50-55 and grade 1 diastolic dysfunction - Cardiology c/s   - s/p  IV lasix X 1 with continued fine crackles on exam; repeat Lasix 40mg  IV BID x 2 - UA and culture with mild suprapubic tenderness: U/A not concerning, urine culture pending  #Afib, Hx of aortic valve replacement: Echo from 09/2014 revealed mechanical prosthesis was present with mild regurgitation - continues to be tachycardic from 90-100s.  - EKG rate controlled with irregular irregular with multifocal atrial tachycardia - Continue Amiodarone, diltiazem, warfarin -Pt with history of Aortic root dilation. Last CTA chest in November 2014 showed aortic root of 4.7 cm. His cardiologist wanted patient to have a surveillance CTA chest in November 2015- consider repeat here - Does this need to be increased?  #BPH: stable  - continue alfuzosin    #CKD stage 3a. Baseline creatinine 1.5  - Elevated this AM to 1.86 in the setting of diuresis - monitor Cre, especially considering diuresis - avoid nephrotoxins if possible   FEN/GI: KVO fluids Prophylaxis: on coumadin  Disposition: Pending clinical improvement   Subjective:  Patient with palpitations with over exertion. No SOB or chest pain   Objective: Temp:  [97.4 F (36.3 C)-98.7 F (37.1 C)] 97.9 F (36.6 C) (11/13 0514) Pulse Rate:  [65-110] 71 (11/13 0514) Resp:  [17-25] 18 (11/13 0514) BP: (113-153)/(50-72) 130/72 mmHg (11/13 0514) SpO2:  [97 %-99 %] 97 % (11/13 0514) Weight:  [184 lb 11.2 oz (83.779 kg)-187 lb 6.3 oz (85 kg)] 184 lb 11.2 oz (83.779 kg) (11/13 0514) Physical Exam: Gen: NAD, alert, cooperative with exam sitting up in bedside chair HEENT: NCAT, PERRL CV: Tachycardic, regular rhythm, no murmur, rubs, or gallops noted Resp: mildly labored, soft crackles on the R, CTA on the L Abd: +BS, soft, ND/NT Ext: No edema, 1+ DP pulses Neuro: Alert and oriented, No gross deficits. Able to recall 2/3 words (3/3 with prompting) and adequate clock draw.  Laboratory:  Recent Labs Lab 11/06/14 1320 11/07/14 0310  WBC 7.1 6.3  HGB 10.8* 10.5*  HCT 34.1* 33.2*  PLT 242 242    Recent Labs Lab 11/06/14 1320 11/07/14 0310  NA 140 141  K 4.0 4.1  CL 102 101  CO2 22 24  BUN 17 21  CREATININE 1.45* 1.86*  CALCIUM 9.2 8.9  GLUCOSE 102* 109*   PBNP: 1169 Troponin neg x 3 INR 2.39 U/A: Negative for nitrite and LE  Imaging/Diagnostic Tests: CXR: There is no evidence of CHF nor other acute cardiopulmonaryabnormality.  EKG 11/06/2014: Irregularly irregular rhythm, varying shapes of P-wave, T wave flattening in leads 3 and aVF, left axis deviation  Joanna Puffrystal S Dorsey, MD 11/07/2014, 7:03 AM PGY-1, [facility] FPTS Intern pager: [phone], text pages welcome

## 2014-11-07 NOTE — Plan of Care (Signed)
Problem: Phase I Progression Outcomes Goal: Dyspnea controlled at rest (HF) Outcome: Completed/Met Date Met:  11/07/14 Goal: Pain controlled with appropriate interventions Outcome: Completed/Met Date Met:  11/07/14 Goal: Up in chair, BRP Outcome: Completed/Met Date Met:  11/07/14 Goal: Voiding-avoid urinary catheter unless indicated Outcome: Completed/Met Date Met:  11/07/14

## 2014-11-07 NOTE — Consult Note (Signed)
CARDIOLOGY CONSULT NOTE   Patient ID: [name] [name] MRN: [id number] DOB/AGE: [birth year] 80 y.o.  Admit date: 11/06/2014  Primary Physician   Abigail Miyamoto, MD Primary Cardiologist   BC/GT Reason for Consultation   CHF, elevated HR  NWG:NFAOZH [name] [name] is a 80 y.o. male with a history of AI, s/p mech AVR 2006, on coumadin, sinus brady, s/p MDT PPM, PAF, SVT, HTN, low thyroid.   Hospitalized for prostate infection at [facility] July 2015.  Hosp for GIB at [facility] 08/2014.   Amio started for afib 10/15/2014 at an office visit.   Admitted 10/23-10/27 to [facility] with DOE, fatigue, MAT. Amio and Dilt increased.  EF 50-55% w/ grade 1 diast dysf by echo 10/18/2014  Seen in ofc 11/02, HR elevated, amio increased. BNP 109, BMET w/ BUNCr 17/1.6 (previously 1.46). Weight 187 pounds  Admitted 11/12 worsening DOE, BUN/Cr 17/1.45. IV Lasix 80 mg given in ER, currently on IV Lasix 40 mg BID. BUN/Cr this am 21/1.86. HR still elevated, cards to see for HR control recs and CHF recs.  [name] [name] is compliant with medications and dietary restrictions. He checks his weight daily and reports that he has not gained any weight, with his weight remaining between 186 and 187 pounds. He denies chest pain. He has not had palpitations.   He noticed gradually increasing dyspnea on exertion since previous discharge from the hospital on 10/27. When he was seen in the office on 11/02, his BNP was 109 and his weight was 187, consistent with what he describes as his dry weight at home. He was complaining of dyspnea on exertion at that time, but no medication changes were made as there was felt to be no objective evidence of volume overload.  He reports increasing dyspnea on exertion and struggled with home physical therapy due to shortness of breath. At the end of the physical therapy section lasting no more than 10 minutes, he was extremely short of breath and his heart rate was measured at  127. He describes orthopnea and possibly PND. He denies lower extremity edema.  He admits to poor exercise tolerance as he has been hospitalized regularly for the last few months. He feels his respiratory status right after he came out of the hospital on 10/27 is very close to his respiratory status when he was released from the hospital after his GI bleed in September 2015. He admits to a poor appetite, stating he never gets hungry. His wife says he eats fairly well but the patient says he is forcing himself to eat.   His activity has been limited by left knee effusion. He has been followed by orthopedics for this and had the effusion drained, last in September 2015 while hospitalized at [facility]. He has an appointment with his orthopedist next week.  He is breathing much better after IV Lasix. Still with DOE. Still weak and has joint pain from left knee and shoulders, all contributing to poor mobility.   Past Medical History  Diagnosis Date  . Elevated PSA   . Aortic insufficiency   . Thoracic aortic aneurysm   . Carpal tunnel syndrome   . SVT (supraventricular tachycardia)   . Amaurosis fugax   . BPH (benign prostatic hypertrophy)   . Hypothyroidism   . Congestive heart failure (CHF)   . Atrial fibrillation   . Presence of permanent cardiac pacemaker     Medtronic  . Lower GI bleeding ~ 08/2014  . History of blood  transfusion ~ 08/2014    related to LGIB  . Anemia   . History of gout      Past Surgical History  Procedure Laterality Date  . Insert / replace / remove pacemaker      Medtronic  . Median sternotomy    . Total knee arthroplasty Right   . Carpal tunnel release Right   . Aortic valve replacement    . Joint replacement    . Inguinal hernia repair Bilateral   . Cardiac valve replacement      No Known Allergies  I have reviewed the patient's current medications . alfuzosin  10 mg Oral Daily  . amiodarone  300 mg Oral Daily  . diltiazem  300 mg Oral Daily   . finasteride  5 mg Oral Daily  . furosemide  40 mg Intravenous BID  . levothyroxine  75 mcg Oral QAC breakfast  . pantoprazole  40 mg Oral Daily  . sodium chloride  3 mL Intravenous Q12H  . traMADol  100 mg Oral TID  . Warfarin - Pharmacist Dosing Inpatient   Does not apply q1800     sodium chloride, HYDROcodone-acetaminophen, sodium chloride  Medication Sig  alfuzosin (UROXATRAL) 10 MG 24 hr tablet Take 10 mg by mouth daily.   amiodarone (PACERONE) 200 MG tablet Take one and one-half tablet by mouth daily Patient taking differently: Take 300 mg by mouth daily. Take one and one-half tablet by mouth daily  diltiazem (CARDIZEM CD) 300 MG 24 hr capsule Take 1 capsule (300 mg total) by mouth daily.  finasteride (PROSCAR) 5 MG tablet Take 5 mg by mouth daily.  Lactobacillus (ACIDOPHILUS PO) Take 1 capsule by mouth 2 (two) times daily.  levothyroxine (SYNTHROID, LEVOTHROID) 75 MCG tablet Take 75 mcg by mouth daily before breakfast.  omeprazole (PRILOSEC) 20 MG capsule Take 20 mg by mouth daily.  Prenatal Vit-Fe Fumarate-FA (PRENATAL VITAMINS PLUS PO) Take 1 tablet by mouth daily.   traMADol (ULTRAM) 50 MG tablet Take 100 mg by mouth 3 (three) times daily.   warfarin (COUMADIN) 4 MG tablet Take 4 mg by mouth daily.      History   Social History  . Marital Status: Married    Spouse Name: N/A    Number of Children: 3  . Years of Education: N/A   Occupational History  . retired     Visual merchandiserfarmer   Social History Main Topics  . Smoking status: Never Smoker   . Smokeless tobacco: Never Used  . Alcohol Use: No  . Drug Use: No  . Sexual Activity: Not on file   Other Topics Concern  . Not on file   Social History Narrative   Lives with his wife who does the cooking and make sure he takes his medicines appropriately.    Family Status  Relation Status Death Age  . Mother Deceased   . Father Deceased   . Sister Alive   . Brother Deceased    Family History  Problem Relation Age of  Onset  . Hypertension Mother   . Stroke Father   . Dementia Sister     youngest sister  . Heart disease Brother      ROS:  Full 14 point review of systems complete and found to be negative unless listed above.  Physical Exam: Blood pressure 114/50, pulse 59, temperature 97.9 F (36.6 C), temperature source Oral, resp. rate 16, height 6' (1.829 m), weight 184 lb 11.2 oz (83.779 kg), SpO2 96 %.  General: Well developed, well nourished, male in no acute distress Head: Eyes PERRLA, No xanthomas.   Normocephalic and atraumatic, oropharynx without edema or exudate. Dentition: poor Lungs: Rales bases, decreased breath sounds bases Heart: Heart irregular rate and rhythm with S1, S2, soft murmur. Crisp valve click. Distal pulses are 2+ all 4 extrem.   Neck: No carotid bruits. No lymphadenopathy.  JVP 9 cm. Abdomen: Bowel sounds present, abdomen soft and non-tender without masses or hernias noted. Msk:  No spine or cva tenderness. No weakness, no joint deformities or effusions. Extremities: No clubbing or cyanosis. no edema. Left knee with significant effusion. Neuro: Alert and oriented X 3. No focal deficits noted. Psych:  Good affect, responds appropriately Skin: No rashes or lesions noted.  Labs:   Lab Results  Component Value Date   WBC 6.3 11/07/2014   HGB 10.5* 11/07/2014   HCT 33.2* 11/07/2014   MCV 78.1 11/07/2014   PLT 242 11/07/2014    Recent Labs  11/07/14 0310  INR 2.37*     Recent Labs Lab 11/07/14 0310  NA 141  K 4.1  CL 101  CO2 24  BUN 21  CREATININE 1.86*  CALCIUM 8.9  GLUCOSE 109*    Recent Labs  11/06/14 1600 11/06/14 2308 11/07/14 0310  TROPONINI <0.30 <0.30 <0.30    Recent Labs  11/06/14 1337  TROPIPOC 0.03   PRO B NATRIURETIC PEPTIDE (BNP)  Date/Time Value Ref Range Status  11/06/2014 01:20 PM 1169.0* 0 - 450 pg/mL Final  10/27/2014 03:42 PM 109.0* 0.0 - 100.0 pg/mL Final   TSH  Date/Time Value Ref Range Status  10/18/2014 03:11  AM 3.100 0.350 - 4.500 uIU/mL Final   Echo: 10/18/2014 Study Conclusions - Left ventricle: The cavity size was normal. There was mild concentric hypertrophy. Systolic function was normal. The estimated ejection fraction was in the range of 50% to 55%. Wall motion was normal; there were no regional wall motion abnormalities. Doppler parameters are consistent with abnormal left ventricular relaxation (grade 1 diastolic dysfunction). - Ventricular septum: Septal motion showed abnormal function and dyssynergy. These changes are consistent with right ventricular pacing. - Aortic valve: A mechanical prosthesis was present. There was mild regurgitation. Valve area (VTI): 2.08 cm^2. Valve area (Vmax): 1.82 cm^2. Valve area (Vmean): 1.96 cm^2.  Mean gradient (S): 10 mm Hg.  Peak gradient (S): 22 mm Hg. - Left atrium: The atrium was moderately dilated. - Right atrium: The atrium was mildly dilated.  ECG:  11/06/2014 SR, some variation in PR intervals/shape HR 79, no acute ischemic changes.  Radiology:  Dg Chest Port 1 View 11/06/2014   CLINICAL DATA:  New onset of atrial fibrillation with sensation of weakness and shortness of breath beginning this morning; recent hospitalization  EXAM: PORTABLE CHEST - 1 VIEW  COMPARISON:  PA and lateral chest of October 17, 2014  FINDINGS: The lungs are mildly hyperinflated and clear. The cardiac silhouette is mildly enlarged but stable. The pulmonary vascularity is normal. The permanent pacemaker is unchanged in appearance. The patient has undergone previous aortic valve replacement. There are 8 intact sternal wires. There are degenerative changes of the shoulders.  IMPRESSION: There is no evidence of CHF nor other acute cardiopulmonary abnormality.   Electronically Signed   By: David  [place]azil[place]   On: 11/06/2014 13:37    ASSESSMENT AND PLAN:   The patient was seen today by Dr. Shirlee LatchMcLean, the patient evaluated and the data reviewed.  Active  Problems:   SOB (shortness of breath) -  see below    CHF exacerbation - still with some extra volume on board by exam. Continue IV Lasix for now, likely change to PO in am. Follow renal function. Consider calorie count to assess nutritional status as he may not be eating enough (losing weight + gaining fluid = no change in the scale).     Atrial tachycardia, multifocal - still with variability in his P waves but HR generally better controlled. Feel SOB contributed to tachycardia. MD review rx, consider changing Dilt/adding BB for better HR control. Continue amio at current dose    Tachycardia - improved    Left knee effusion - may need to be drained, that would probably help him increase his activity. With coumadin, effusion more likely to recur, asked the family to bring in a sleeve that he has at home to see if wearing this would help.     Chronic anticoag - coumadin is therapeutic    Anemia - MCV is low-nl, consider stool guiac, iron studies, H&H lower than at d/c 10/27.     CKD stage III - Cr increased after Lasix 80 mg IV x 1, now on 40 mg IV BID, follow.  SignedTheodore Demark, PA-C 11/07/2014 2:11 PM [phone]  Co-Sign MD  Patient seen with PA, agree with the above note.   1. Dyspnea: Patient presented with dyspnea, slowly progressive, for > 1 month.  He has been treated with IV Lasix and is feeling better.  He actually does not look markedly volume overloaded on exam.  Probably needs 1 more day of IV Lasix then transition to po tomorrow.  He will likely need to be on some standing Lasix at home (was not getting Lasix prior to admission).   2. Atrial fibrillation: Patient has history of PAF.  He has been feeling palpitations frequently.  He has been in NSR here in the hospital.  Will have Medtronic interrogate his pacemaker to see what his atrial fibrillation burden is.  Continue amiodarone and warfarin.  3. CKD: Creatinine up a bit today with diuresis.  Probably convert to  po Lasix tomorrow.   Marca Ancona 11/07/2014 3:19 PM

## 2014-11-08 DIAGNOSIS — N189 Chronic kidney disease, unspecified: Secondary | ICD-10-CM

## 2014-11-08 DIAGNOSIS — N179 Acute kidney failure, unspecified: Secondary | ICD-10-CM

## 2014-11-08 DIAGNOSIS — I495 Sick sinus syndrome: Secondary | ICD-10-CM | POA: Insufficient documentation

## 2014-11-08 LAB — BASIC METABOLIC PANEL
Anion gap: 16 — ABNORMAL HIGH (ref 5–15)
BUN: 24 mg/dL — ABNORMAL HIGH (ref 6–23)
CO2: 25 mEq/L (ref 19–32)
Calcium: 8.7 mg/dL (ref 8.4–10.5)
Chloride: 98 mEq/L (ref 96–112)
Creatinine, Ser: 1.8 mg/dL — ABNORMAL HIGH (ref 0.50–1.35)
GFR calc Af Amer: 38 mL/min — ABNORMAL LOW (ref >90)
GFR calc non Af Amer: 33 mL/min — ABNORMAL LOW (ref >90)
GLUCOSE: 98 mg/dL (ref 70–99)
Potassium: 3.5 mEq/L — ABNORMAL LOW (ref 3.7–5.3)
SODIUM: 139 meq/L (ref 137–147)

## 2014-11-08 LAB — PROTIME-INR
INR: 2.77 — ABNORMAL HIGH (ref 0.00–1.49)
Prothrombin Time: 29.5 seconds — ABNORMAL HIGH (ref 11.6–15.2)

## 2014-11-08 MED ORDER — WARFARIN SODIUM 4 MG PO TABS
4.0000 mg | ORAL_TABLET | Freq: Once | ORAL | Status: AC
  Administered 2014-11-08: 4 mg via ORAL
  Filled 2014-11-08: qty 1

## 2014-11-08 MED ORDER — DOCUSATE SODIUM 100 MG PO CAPS
100.0000 mg | ORAL_CAPSULE | Freq: Two times a day (BID) | ORAL | Status: DC
Start: 2014-11-08 — End: 2014-11-09
  Administered 2014-11-08 – 2014-11-09 (×3): 100 mg via ORAL
  Filled 2014-11-08 (×4): qty 1

## 2014-11-08 MED ORDER — FUROSEMIDE 40 MG PO TABS
40.0000 mg | ORAL_TABLET | Freq: Every day | ORAL | Status: DC
  Administered 2014-11-08 – 2014-11-09 (×2): 40 mg via ORAL
  Filled 2014-11-08 (×2): qty 1

## 2014-11-08 NOTE — Progress Notes (Signed)
Family Medicine Teaching Service Daily Progress Note Intern Pager: [phone]  Patient name: [name] [name] Medical record number: [id number] Date of birth: 09/17/1928 Age: 80 y.o. Gender: male  Primary Care Provider: Abigail MiyamotoPERRY,LAWRENCE EDWARD, MD Consultants: Cardiology Code Status: DNR  Pt Overview and Major Events to Date:  11/12: Presents with progressive dyspnea and weakness for several days 11/13: c/s Cards concern fluid overload, improved on Lasix IV, dyspnea also likely from MAT 11/14: transition to Lasix PO  Assessment and Plan: [name] [name] is a 80 y/o male presenting with exertional dyspnea and weakness. PMH is significant for diastolic CHF, atrial fibrillation s/p pacemake, aortic valve replacement, BPH, and HTN.  # Dyspnea and generalized weakness, suspected secondary to Systolic CHF Exac and Tachycardia - Clinically consistent some degree of volume overload given +bibasilar crackles (no ext edema), elevated ProBNP 1169, good response to IV Lasix with good UOP. Additionally, component of AFib / MAT contributing to dyspnea. Considered ACS, ruled out MI as possible etiology (neg Troponon-I x 3, EKG with non-specific T wave changes) - Similar to recent admission, at that time resolved from rate control for AFib, was not DC'd on Lasix at that time.  - Remain on telemetry - Repeat EKG pending - Continue diuresis as below  # Systolic CHF, presumed exacerbation: - Last ECHO (10/18/14) EF 50-55%, grade 1 diastolic dysfunction - Consulted Cardiology - S/p Lasix 40mg  IV BID x 24 hours with good improvement - Plan per Cards to transition to Lasix PO 40mg  today, plans to continue daily Lasix on discharge  # Afib, Atrial Tachycardia, Hx of aortic valve replacement: Echo from 09/2014 revealed mechanical prosthesis was present with mild regurgitation - continues to be tachycardic from 90-100s.  - EKG rate controlled with irregular irregular with multifocal atrial tachycardia - Per Cards, continue  Amiodarone and Warfin - Appreciate recommendations on changing Diltiazem vs adding BB  # BPH: stable  - continue alfuzosin   #CKD stage 3a. Baseline creatinine 1.5  - Elevated Cr recently due to Lasix, Cr 1.4 to 1.8, remained stable 1.86 to 1.80 today - Monitor BMET - avoid nephrotoxins if possible   FEN/GI: KVO fluids Prophylaxis: on coumadin  Disposition: Significantly improved dyspnea on diuresis, plan to reduce from Lasix IV to PO today, Cardiology following, continue rate control and anticoagulation. If patient remains improved, considered DC today, however given transition to PO diuresis today, will monitor for 24 hours and anticipate discharge tomorrow if remains improved. - HH PT ordered  Subjective:  Reports overall doing better, currently no palpitations (has not ambulated today), Denies any shortness of breath or Chest Pain. Tolerating breakfast well. Eager to be discharged.  Objective: Temp:  [97.6 F (36.4 C)-97.9 F (36.6 C)] 97.6 F (36.4 C) (11/14 0954) Pulse Rate:  [59-86] 86 (11/14 0954) Resp:  [16-18] 18 (11/14 0954) BP: (114-156)/(50-73) 156/54 mmHg (11/14 0954) SpO2:  [95 %-99 %] 99 % (11/14 0954) Weight:  [184 lb 1.6 oz (83.507 kg)] 184 lb 1.6 oz (83.507 kg) (11/14 0515) Physical Exam: Gen: NAD, cooperative, sitting up, conversant CV: Tachycardic, irregularly irregular, no murmur, rubs, or gallops noted Resp: Significant improved air movement with normal work of breathing. Persistent mild bibasilar crackles. Abd: +BS, soft, ND/NT Ext: No edema, non tender 1+ DP pulses Neuro: Alert and oriented, No gross deficits  Laboratory:  Recent Labs Lab 11/06/14 1320 11/07/14 0310  WBC 7.1 6.3  HGB 10.8* 10.5*  HCT 34.1* 33.2*  PLT 242 242    Recent Labs Lab 11/06/14 1320 11/07/14 0310 11/08/14  0403  NA 140 141 139  K 4.0 4.1 3.5*  CL 102 101 98  CO2 22 24 25   BUN 17 21 24*  CREATININE 1.45* 1.86* 1.80*  CALCIUM 9.2 8.9 8.7  GLUCOSE 102* 109*  98   PBNP: 1169 Troponin neg x 3 INR 2.39 U/A: Negative for nitrite and LE  Imaging/Diagnostic Tests: CXR: There is no evidence of CHF nor other acute cardiopulmonaryabnormality.  EKG 11/06/2014: Irregularly irregular rhythm, varying shapes of P-wave, T wave flattening in leads 3 and aVF, left axis deviation  Saralyn PilarAlexander Karamalegos, DO 11/08/2014, 10:24 AM PGY-2, [facility] FPTS Intern pager: [phone], text pages welcome

## 2014-11-08 NOTE — Progress Notes (Signed)
SUBJECTIVE: The patient is doing well today.  SOB is much improved.  He says that he wants to go home.  At this time, he denies chest pain or any new concerns.  [name]. alfuzosin  10 mg Oral Daily  . amiodarone  300 mg Oral Daily  . diltiazem  300 mg Oral Daily  . docusate sodium  100 mg Oral BID  . finasteride  5 mg Oral Daily  . levothyroxine  75 mcg Oral QAC breakfast  . pantoprazole  40 mg Oral Daily  . sodium chloride  3 mL Intravenous Q12H  . traMADol  100 mg Oral TID  . Warfarin - Pharmacist Dosing Inpatient   Does not apply q1800      OBJECTIVE: Physical Exam: Filed Vitals:   11/07/14 2028 11/08/14 0136 11/08/14 0515 11/08/14 0954  BP: 122/55 121/52 124/73 156/54  Pulse: 61 64 63 86  Temp: 97.9 F (36.6 C) 97.6 F (36.4 C) 97.7 F (36.5 C) 97.6 F (36.4 C)  TempSrc: Oral Oral Oral Oral  Resp: 18 18 18 18   Height:      Weight:   184 lb 1.6 oz (83.507 kg)   SpO2: 96% 98% 95% 99%    Intake/Output Summary (Last 24 hours) at 11/08/14 1041 Last data filed at 11/08/14 0900  Gross per 24 hour  Intake   1160 ml  Output   1235 ml  Net    -75 ml    Telemetry reveals sinus rhythm with demand pacing, no arrhythmias  GEN- The patient is well appearing, alert and oriented x 3 today.   Head- normocephalic, atraumatic Eyes-  Sclera clear, conjunctiva pink Ears- hearing intact Oropharynx- clear Neck- supple, JVP 8cm Lungs- Clear to ausculation bilaterally, normal work of breathing Heart- Regular rate and rhythm, mechanical S2 GI- soft, NT, ND, + BS Extremities- no clubbing, cyanosis, or edema Skin- no rash or lesion Psych- euthymic mood, full affect Neuro- strength and sensation are intact  LABS: Basic Metabolic Panel:  Recent Labs  47/82/9511/13/15 0310 11/08/14 0403  NA 141 139  K 4.1 3.5*  CL 101 98  CO2 24 25  GLUCOSE 109* 98  BUN 21 24*  CREATININE 1.86* 1.80*  CALCIUM 8.9 8.7   Liver Function Tests: No results for input(s): AST, ALT, ALKPHOS, BILITOT,  PROT, ALBUMIN in the last 72 hours. No results for input(s): LIPASE, AMYLASE in the last 72 hours. CBC:  Recent Labs  11/06/14 1320 11/07/14 0310  WBC 7.1 6.3  HGB 10.8* 10.5*  HCT 34.1* 33.2*  MCV 79.3 78.1  PLT 242 242   Cardiac Enzymes:  Recent Labs  11/06/14 1600 11/06/14 2308 11/07/14 0310  TROPONINI <0.30 <0.30 <0.30  RADIOLOGY: Dg Chest 2 View  10/17/2014   CLINICAL DATA:  80 year old male with 3- 4 day history of lethargy, dizziness and shortness of breath  EXAM: CHEST  2 VIEW  COMPARISON:  Prior chest x-ray 09/17/2014  FINDINGS: Stable cardiomegaly with left ventricular prominence. Mediastinal contours are also within normal limits. Prosthetic aortic valve. Left subclavian cardiac rhythm maintenance device with leads in unchanged position overlying the right atrium and right ventricle. Mild central bronchitic change similar compared to prior. Lingular atelectasis versus scarring is also unchanged. No pulmonary edema, effusion, pneumothorax or focal airspace consolidation. No acute osseous abnormality. Multilevel degenerative spurring throughout the spine. Bilateral glenohumeral joint osteoarthritis.  IMPRESSION: Stable chest x-ray without evidence of acute cardiopulmonary process.   Electronically Signed   By: Isac CaddyHeath  McCullough M.D.  On: 10/17/2014 16:23   Ct Head Wo Contrast  10/17/2014   CLINICAL DATA:  Fall.  Not feeling well.  EXAM: CT HEAD WITHOUT CONTRAST  TECHNIQUE: Contiguous axial images were obtained from the base of the skull through the vertex without intravenous contrast.  COMPARISON:  10/23/2013  FINDINGS: Mild chronic microvascular ischemic changes in the periventricular white matter are stable. Small mega cisterna magna again noted, an incidental finding. Negative for intra or extra-axial hemorrhage, mass effect, mass lesion, or evidence of acute cortically based infarction. Atherosclerotic calcification of the vertebral arteries and basilar artery noted. The  skull is intact. Visualized paranasal sinuses and mastoid air cells are clear. Leftward directed spur on the nasal septum.  IMPRESSION: No acute intracranial abnormality.   Electronically Signed   By: Britta MccreedySusan  Turner M.D.   On: 10/17/2014 16:24   Dg Chest Port 1 View  11/06/2014   CLINICAL DATA:  New onset of atrial fibrillation with sensation of weakness and shortness of breath beginning this morning; recent hospitalization  EXAM: PORTABLE CHEST - 1 VIEW  COMPARISON:  PA and lateral chest of October 17, 2014  FINDINGS: The lungs are mildly hyperinflated and clear. The cardiac silhouette is mildly enlarged but stable. The pulmonary vascularity is normal. The permanent pacemaker is unchanged in appearance. The patient has undergone previous aortic valve replacement. There are 8 intact sternal wires. There are degenerative changes of the shoulders.  IMPRESSION: There is no evidence of CHF nor other acute cardiopulmonary abnormality.   Electronically Signed   By: David  SwazilandJordan   On: 11/06/2014 13:37    ASSESSMENT AND PLAN:  Active Problems:   SOB (shortness of breath)   CHF exacerbation   Atrial tachycardia, multifocal   Tachycardia  1. Acute on chronic diastolic dysfunction Clinically improved with diuresis Would convert lasix to 40mg  po daily.  Will need close follow-up given renal failure 2 gram sodium restriction  2. Atach/ sick sinus syndrome Well controlled PPM interrogation is reviewed and normal.  His atrial arrhythmia burden is well controlled (<.1%) Continue anticoagulation  3. Acute on chronic renal failure Likely contributes to volume overload Lasix and sodium restriction as above Will need repeat bmet in 1 week  4. S/p AVR Continue long term anticoagulation  Ok to discharge from CV standpoint  Will need transition of care appointment with cardiology APP or Dr Jens Somrenshaw at discharge.   Hillis RangeAllred, Yomayra Tate, MD 11/08/2014 10:41 AM

## 2014-11-08 NOTE — Plan of Care (Signed)
Problem: Phase II Progression Outcomes Goal: Tolerating diet Outcome: Completed/Met Date Met:  11/08/14     

## 2014-11-08 NOTE — Care Management Note (Signed)
    Page 1 of 2   11/08/2014     4:41:01 PM CARE MANAGEMENT NOTE 11/08/2014  Patient:  DASH, CARDARELLI   Account Number:  [id number]  Date Initiated:  11/08/2014  Documentation [facility]:  [facility]  Subjective/Objective Assessment:   adm: Dyspnea and generalized weakness, suspected secondary to Systolic CHF Exac and Tachycardia     Action/Plan:   discharge plannning   Anticipated DC Date:  11/08/2014   Anticipated DC Plan:  [place]  CM consult      [facility] Choice  HOME HEALTH   Choice offered to / List presented to:  C-1 Patient   DME arranged  [place]      DME agency  [place] arranged  HH-1 RN  [place]      [place] agency  [place]   Status of service:  Completed, signed off Medicare Important Message given?   (If response is "NO", the following Medicare IM given date fields will be blank) Date Medicare IM given:   Medicare IM given by:   Date Additional Medicare IM given:   Additional Medicare IM given by:    Discharge Disposition:  [place]  Per UR Regulation:    If discussed at Long Length of Stay Meetings, dates discussed:    Comments:  11/08/14 11:50 Cm met with pt in room to confirm pt's choice of [facility].  Pt is active with [facility].  Cm called [place] DME rep, James to please deliver rolling walker to room prior to discharge.  CM called [facility] and spoke with Abigail Butts who requested I fax orders, facesheet, F2F, H&P, and DC summary to [place].  CM faxed aforementioned.  [facility] wil render HHPT/RN/OT/SW.  No other CM needs were communicated.  Mariane Masters, BSN, [place].

## 2014-11-08 NOTE — Progress Notes (Signed)
ANTICOAGULATION CONSULT NOTE - Follow up  Pharmacy Consult:  Coumadin Indication: atrial fibrillation  No Known Allergies  Patient Measurements: Height: 6' (182.9 cm) Weight: 184 lb 1.6 oz (83.507 kg) (Scale A) IBW/kg (Calculated) : 77.6  Vital Signs: Temp: 98 F (36.7 C) (11/14 1300) Temp Source: Oral (11/14 1300) BP: 119/53 mmHg (11/14 1300) Pulse Rate: 65 (11/14 1300)  Labs:  Recent Labs  11/06/14 1320 11/06/14 1600 11/06/14 2308 11/07/14 0310 11/08/14 0403  HGB 10.8*  --   --  10.5*  --   HCT 34.1*  --   --  33.2*  --   PLT 242  --   --  242  --   LABPROT 26.3*  --   --  26.1* 29.5*  INR 2.39*  --   --  2.37* 2.77*  CREATININE 1.45*  --   --  1.86* 1.80*  TROPONINI  --  <0.30 <0.30 <0.30  --     Estimated Creatinine Clearance: 33.5 mL/min (by C-G formula based on Cr of 1.8).   Medical History: Past Medical History  Diagnosis Date  . Elevated PSA   . Aortic insufficiency   . Thoracic aortic aneurysm   . Carpal tunnel syndrome   . SVT (supraventricular tachycardia)   . Amaurosis fugax   . BPH (benign prostatic hypertrophy)   . Hypothyroidism   . Congestive heart failure (CHF)   . Atrial fibrillation   . Presence of permanent cardiac pacemaker     Medtronic  . Lower GI bleeding ~ 08/2014  . History of blood transfusion ~ 08/2014    related to LGIB  . Anemia   . History of gout      Assessment: 80 YO M admitted 11/06/14 with HF exacerbation and afib. He was continued on Coumadin from PTA for history of Afib . Also s/p mech AVR 2006.   INR is 2.77 today, remains therapeutic on home regimen.  No bleeding reported. Anemia - H&H lower than at d/c 10/27.   Home coumadin regimen: 4mg  daily PTA, last dose 11/05/14. Amio started for afib 10/15/2014 at an office visit. [facility]. Dr. Shirlee LatchMcLean reports that patient is compliant with medications and dietary restrictions  Goal of Therapy:  INR 2-3    Plan:  - Coumadin 4mg  PO today -  Daily PT / INR - K 3.5, ?add supplement   Toys 'R' UsKimberly Clevon Khader, Pharm.D., BCPS Clinical Pharmacist Pager [phone] 11/08/2014 2:23 PM

## 2014-11-09 DIAGNOSIS — I5033 Acute on chronic diastolic (congestive) heart failure: Secondary | ICD-10-CM | POA: Insufficient documentation

## 2014-11-09 DIAGNOSIS — I480 Paroxysmal atrial fibrillation: Secondary | ICD-10-CM

## 2014-11-09 DIAGNOSIS — K5909 Other constipation: Secondary | ICD-10-CM

## 2014-11-09 LAB — PROTIME-INR
INR: 3.3 — AB (ref 0.00–1.49)
Prothrombin Time: 33.8 seconds — ABNORMAL HIGH (ref 11.6–15.2)

## 2014-11-09 LAB — BASIC METABOLIC PANEL
ANION GAP: 13 (ref 5–15)
BUN: 23 mg/dL (ref 6–23)
CHLORIDE: 95 meq/L — AB (ref 96–112)
CO2: 25 meq/L (ref 19–32)
Calcium: 8.7 mg/dL (ref 8.4–10.5)
Creatinine, Ser: 1.66 mg/dL — ABNORMAL HIGH (ref 0.50–1.35)
GFR calc Af Amer: 42 mL/min — ABNORMAL LOW (ref >90)
GFR calc non Af Amer: 36 mL/min — ABNORMAL LOW (ref >90)
GLUCOSE: 92 mg/dL (ref 70–99)
Potassium: 3.6 mEq/L — ABNORMAL LOW (ref 3.7–5.3)
SODIUM: 133 meq/L — AB (ref 137–147)

## 2014-11-09 MED ORDER — POLYETHYLENE GLYCOL 3350 17 G PO PACK
17.0000 g | PACK | Freq: Every day | ORAL | Status: AC | PRN
Start: 2014-11-09 — End: ?

## 2014-11-09 MED ORDER — FUROSEMIDE 40 MG PO TABS: 40.0000 mg | ORAL_TABLET | Freq: Every day | ORAL | Status: DC

## 2014-11-09 MED ORDER — SENNA 8.6 MG PO TABS
2.0000 | ORAL_TABLET | Freq: Once | ORAL | Status: AC
  Administered 2014-11-09: 17.2 mg via ORAL
  Filled 2014-11-09: qty 2

## 2014-11-09 MED ORDER — POLYETHYLENE GLYCOL 3350 17 G PO PACK
17.0000 g | PACK | Freq: Once | ORAL | Status: AC
  Administered 2014-11-09: 17 g via ORAL

## 2014-11-09 NOTE — Progress Notes (Signed)
SUBJECTIVE: The patient is doing well today.  His primary concern is with constipation.   At this time, he denies chest pain, SOB, or any new concerns.  [name]. alfuzosin  10 mg Oral Daily  . amiodarone  300 mg Oral Daily  . diltiazem  300 mg Oral Daily  . docusate sodium  100 mg Oral BID  . finasteride  5 mg Oral Daily  . furosemide  40 mg Oral Daily  . levothyroxine  75 mcg Oral QAC breakfast  . pantoprazole  40 mg Oral Daily  . polyethylene glycol  17 g Oral Once  . senna  2 tablet Oral Once  . sodium chloride  3 mL Intravenous Q12H  . traMADol  100 mg Oral TID  . Warfarin - Pharmacist Dosing Inpatient   Does not apply q1800      OBJECTIVE: Physical Exam: Filed Vitals:   11/08/14 1300 11/08/14 1959 11/08/14 2040 11/09/14 0405  BP: 119/53 130/43 124/54 127/55  Pulse: 65 60 61 60  Temp: 98 F (36.7 C) 98.1 F (36.7 C)  98.1 F (36.7 C)  TempSrc: Oral Oral  Oral  Resp: 18 16    Height:      Weight:    184 lb 3.2 oz (83.553 kg)  SpO2: 96% 95%  99%    Intake/Output Summary (Last 24 hours) at 11/09/14 0931 Last data filed at 11/09/14 0405  Gross per 24 hour  Intake    720 ml  Output    850 ml  Net   -130 ml    Telemetry reveals sinus rhythm with demand pacing, some afib overnight, now back in sinus  GEN- The patient is well appearing, alert and oriented x 3 today.   Head- normocephalic, atraumatic Eyes-  Sclera clear, conjunctiva pink Ears- hearing intact Oropharynx- clear Neck- supple, JVP 8cm Lungs- Clear to ausculation bilaterally, normal work of breathing Heart- Regular rate and rhythm, mechanical S2 GI- soft, NT, ND, + BS Extremities- no clubbing, cyanosis, or edema Skin- no rash or lesion Psych- euthymic mood, full affect Neuro- strength and sensation are intact  LABS: Basic Metabolic Panel:  Recent Labs  16/09/9610/14/15 0403 11/09/14 0350  NA 139 133*  K 3.5* 3.6*  CL 98 95*  CO2 25 25  GLUCOSE 98 92  BUN 24* 23  CREATININE 1.80* 1.66*  CALCIUM 8.7  8.7   Liver Function Tests: No results for input(s): AST, ALT, ALKPHOS, BILITOT, PROT, ALBUMIN in the last 72 hours. No results for input(s): LIPASE, AMYLASE in the last 72 hours. CBC:  Recent Labs  11/06/14 1320 11/07/14 0310  WBC 7.1 6.3  HGB 10.8* 10.5*  HCT 34.1* 33.2*  MCV 79.3 78.1  PLT 242 242   Cardiac Enzymes:  Recent Labs  11/06/14 1600 11/06/14 2308 11/07/14 0310  TROPONINI <0.30 <0.30 <0.30   ASSESSMENT AND PLAN:  Active Problems:   SOB (shortness of breath)   CHF exacerbation   Atrial tachycardia, multifocal   Tachycardia   Sick sinus syndrome  1. Acute on chronic diastolic dysfunction Clinically improved with diuresis Now back on oral lasix 2 gram sodium restriction  2. Atach/ sick sinus syndrome Well controlled, afib without symptoms overnight His atrial arrhythmia burden is well controlled (<.1%) per his PPM interrogation this admit. Continue anticoagulation  3. Acute on chronic renal failure improved Lasix and sodium restriction as above Will need repeat bmet in 1 week  4. S/p AVR Continue long term anticoagulation  Ok to discharge from CV standpoint  Will need transition of care appointment with cardiology APP or Dr Jens Somrenshaw at discharge.  Cardiology team to see as needed while here. Please call with questions.    Hillis RangeAllred, Mylik Pro, MD 11/09/2014 9:31 AM

## 2014-11-09 NOTE — Discharge Instructions (Signed)

## 2014-11-10 NOTE — Progress Notes (Signed)
HPI: FU AVR; hx of aortic insufficiency and ascending thoracic aortic aneurysm s/p Bentall procedure with mechanical AVR in 2006, sinus bradycardia status post permanent pacemaker implantation, PAF, SVT, EF of 45-50% in the past, HTN, hypothyroidism. CTA 11/14 showed stable aneurysmal disease of ascending aorta with maximal diameter of 4.7 cm. Patient was seen by Dr. Ladona Ridgelaylor in 10/15 with recurrent and increasingly symptomatic atrial fibrillation. He has also been noted to have MAT. Amiodarone initiated. Echocardiogram 10/15 demonstrated normal LV function with an EF of 50-55%. AVR was functioning appropriately. Note had EGD 9/15 showing gastritis, duodenitis, hiatal hernia and stricture. Colonoscopy showed extensive diverticular disease and hemorrhoids. Admitted 11/15 with diastolic CHF that improved with diuresis; since DC, He has some fatigue and weakness. Mild dyspnea on exertion. No orthopnea, PND, pedal edema or chest pain. No syncope or bleeding.   Studies: - LHC (1/06): Proximal RCA 30%, large thoracic aortic aneurysm (7 cm by CT scan) - Echo (09/2014): Mild concentric hypertrophy. EF 50% to 55%. Wall motion was normal. Grade 1 diastolic dysfunction. Aortic valve: A mechanical prosthesis was present. There was mildregurgitation. Mean gradient (S): 10 mm Hg. Peak gradient (S): 22 mm Hg.Moderate LAE, mild RAE - Carotid US (11/14): Bilateral 1-39% ICA  Current Outpatient Prescriptions  Medication Sig Dispense Refill  . alfuzosin (UROXATRAL) 10 MG 24 hr tablet Take 10 mg by mouth daily.     [name]. amiodarone (PACERONE) 200 MG tablet Take one and one-half tablet by mouth daily (Patient taking differently: Take 300 mg by mouth daily. Take one and one-half tablet by mouth daily) 45 tablet 3  . diltiazem (CARDIZEM CD) 300 MG 24 hr capsule Take 1 capsule (300 mg total) by mouth daily. 30 capsule 1  . finasteride (PROSCAR) 5 MG tablet Take 5 mg by mouth daily.    . furosemide (LASIX) 40 MG  tablet Take 1 tablet (40 mg total) by mouth daily. 30 tablet 1  . Lactobacillus (ACIDOPHILUS PO) Take 1 capsule by mouth 2 (two) times daily.    [name]. levothyroxine (SYNTHROID, LEVOTHROID) 75 MCG tablet Take 75 mcg by mouth daily before breakfast.    . omeprazole (PRILOSEC) 20 MG capsule Take 20 mg by mouth daily.    . polyethylene glycol (MIRALAX / GLYCOLAX) packet Take 17 g by mouth daily as needed. Until stooling regularly 30 packet 0  . Prenatal Vit-Fe Fumarate-FA (PRENATAL VITAMINS PLUS PO) Take 1 tablet by mouth daily.     . traMADol (ULTRAM) 50 MG tablet Take 100 mg by mouth 3 (three) times daily.     [name]. warfarin (COUMADIN) 4 MG tablet Take 4 mg by mouth daily.      No current facility-administered medications for this visit.     Past Medical History  Diagnosis Date  . Elevated PSA   . Aortic insufficiency   . Thoracic aortic aneurysm   . Carpal tunnel syndrome   . SVT (supraventricular tachycardia)   . Amaurosis fugax   . BPH (benign prostatic hypertrophy)   . Hypothyroidism   . Congestive heart failure (CHF)   . Atrial fibrillation   . Presence of permanent cardiac pacemaker     Medtronic  . Lower GI bleeding ~ 08/2014  . History of blood transfusion ~ 08/2014    related to LGIB  . Anemia   . History of gout     Past Surgical History  Procedure Laterality Date  . Insert / replace / remove pacemaker      Medtronic  .  Median sternotomy    . Total knee arthroplasty Right   . Carpal tunnel release Right   . Aortic valve replacement    . Joint replacement    . Inguinal hernia repair Bilateral   . Cardiac valve replacement      History   Social History  . Marital Status: Married    Spouse Name: N/A    Number of Children: 3  . Years of Education: N/A   Occupational History  . retired     Visual merchandiserfarmer   Social History Main Topics  . Smoking status: Never Smoker   . Smokeless tobacco: Never Used  . Alcohol Use: No  . Drug Use: No  . Sexual Activity: Not on file    Other Topics Concern  . Not on file   Social History Narrative   Lives with his wife who does the cooking and make sure he takes his medicines appropriately.    ROS: Constipation and fatigue but no fevers or chills, productive cough, hemoptysis, dysphasia, odynophagia, melena, hematochezia, dysuria, hematuria, rash, seizure activity, orthopnea, PND, pedal edema, claudication. Remaining systems are negative.  Physical Exam: Well-developed well-nourished in no acute distress.  Skin is warm and dry.  HEENT is normal.  Neck is supple.  Chest is clear to auscultation with normal expansion.  Cardiovascular exam is regular rate and rhythm. Crisp mechanical valve sound Abdominal exam nontender or distended. No masses palpated. Extremities show no edema. neuro grossly intact  ECG 11/06/2014-sinus rhythm with PACs, left axis deviation.

## 2014-11-13 ENCOUNTER — Ambulatory Visit (INDEPENDENT_AMBULATORY_CARE_PROVIDER_SITE_OTHER): Payer: Medicare Other | Admitting: Cardiology

## 2014-11-13 ENCOUNTER — Ambulatory Visit (INDEPENDENT_AMBULATORY_CARE_PROVIDER_SITE_OTHER): Payer: Medicare Other | Admitting: *Deleted

## 2014-11-13 ENCOUNTER — Encounter: Payer: Self-pay | Admitting: Cardiology

## 2014-11-13 ENCOUNTER — Other Ambulatory Visit: Payer: Self-pay | Admitting: Cardiology

## 2014-11-13 VITALS — BP 130/70 | HR 88 | Ht 72.0 in | Wt 190.1 lb

## 2014-11-13 DIAGNOSIS — I5032 Chronic diastolic (congestive) heart failure: Secondary | ICD-10-CM

## 2014-11-13 DIAGNOSIS — I712 Thoracic aortic aneurysm, without rupture, unspecified: Secondary | ICD-10-CM

## 2014-11-13 DIAGNOSIS — I480 Paroxysmal atrial fibrillation: Secondary | ICD-10-CM

## 2014-11-13 DIAGNOSIS — I359 Nonrheumatic aortic valve disorder, unspecified: Secondary | ICD-10-CM

## 2014-11-13 DIAGNOSIS — R0600 Dyspnea, unspecified: Secondary | ICD-10-CM

## 2014-11-13 DIAGNOSIS — Z950 Presence of cardiac pacemaker: Secondary | ICD-10-CM

## 2014-11-13 DIAGNOSIS — I10 Essential (primary) hypertension: Secondary | ICD-10-CM

## 2014-11-13 DIAGNOSIS — Z7901 Long term (current) use of anticoagulants: Secondary | ICD-10-CM

## 2014-11-13 LAB — POCT INR: INR: 3.8

## 2014-11-13 NOTE — Patient Instructions (Signed)
Your physician recommends that you schedule a follow-up appointment in: 4 WEEKS WITH DR Jens SomRENSHAW  Your physician recommends that you HAVE LAB WORK TODAY

## 2014-11-13 NOTE — Assessment & Plan Note (Signed)
Continue present dose of Lasix.Check potassium, renal function and BNP.

## 2014-11-13 NOTE — Assessment & Plan Note (Signed)
Blood pressure controlled. Continue present medications. 

## 2014-11-13 NOTE — Assessment & Plan Note (Signed)
Continue SBE prophylaxis. 

## 2014-11-13 NOTE — Assessment & Plan Note (Signed)
Schedule follow-up CTA if renal function stable.

## 2014-11-13 NOTE — Assessment & Plan Note (Signed)
Patient remains in sinus rhythm on exam. Continue amiodarone. At next office visit I will likely decrease to 200 mg daily. Continue Coumadin.

## 2014-11-13 NOTE — Assessment & Plan Note (Signed)
Followed by electrophysiology. 

## 2014-11-14 LAB — BASIC METABOLIC PANEL WITH GFR
BUN: 24 mg/dL — ABNORMAL HIGH (ref 6–23)
CHLORIDE: 97 meq/L (ref 96–112)
CO2: 25 meq/L (ref 19–32)
CREATININE: 1.76 mg/dL — AB (ref 0.50–1.35)
Calcium: 9.2 mg/dL (ref 8.4–10.5)
GFR, Est African American: 40 mL/min — ABNORMAL LOW
GFR, Est Non African American: 35 mL/min — ABNORMAL LOW
Glucose, Bld: 198 mg/dL — ABNORMAL HIGH (ref 70–99)
POTASSIUM: 4.2 meq/L (ref 3.5–5.3)
Sodium: 138 mEq/L (ref 135–145)

## 2014-11-14 LAB — BRAIN NATRIURETIC PEPTIDE: Brain Natriuretic Peptide: 138.4 pg/mL — ABNORMAL HIGH (ref 0.0–100.0)

## 2014-11-17 ENCOUNTER — Ambulatory Visit (INDEPENDENT_AMBULATORY_CARE_PROVIDER_SITE_OTHER): Payer: Medicare Other | Admitting: *Deleted

## 2014-11-17 DIAGNOSIS — Z950 Presence of cardiac pacemaker: Secondary | ICD-10-CM

## 2014-11-17 LAB — MDC_IDC_ENUM_SESS_TYPE_REMOTE
Battery Voltage: 2.88 V
Brady Statistic AP VP Percent: 0.79 %
Brady Statistic AP VS Percent: 45.13 %
Brady Statistic AS VP Percent: 0.39 %
Brady Statistic AS VS Percent: 53.69 %
Brady Statistic RV Percent Paced: 1.18 %
Lead Channel Impedance Value: 456 Ohm
Lead Channel Impedance Value: 488 Ohm
Lead Channel Sensing Intrinsic Amplitude: 5.4579
Lead Channel Setting Pacing Amplitude: 2 V
Lead Channel Setting Pacing Amplitude: 2.5 V
Lead Channel Setting Sensing Sensitivity: 0.9 mV
MDC IDC MSMT LEADCHNL RA SENSING INTR AMPL: 2.7166
MDC IDC SESS DTM: 20151123132134
MDC IDC SET LEADCHNL RV PACING PULSEWIDTH: 0.4 ms
MDC IDC STAT BRADY RA PERCENT PACED: 45.92 %
Zone Setting Detection Interval: 350 ms
Zone Setting Detection Interval: 400 ms

## 2014-11-17 NOTE — Progress Notes (Signed)
Remote pacemaker transmission.   

## 2014-11-24 NOTE — Progress Notes (Signed)
HPI: FU AVR; hx of aortic insufficiency and ascending thoracic aortic aneurysm s/p Bentall procedure with mechanical AVR in 2006, sinus bradycardia status post permanent pacemaker implantation, PAF, SVT, HTN, hypothyroidism. CTA 11/14 showed stable aneurysmal disease of ascending aorta with maximal diameter of 4.7 cm. Patient was seen by Dr. Ladona Ridgelaylor in 10/15 with recurrent and increasingly symptomatic atrial fibrillation. He has also been noted to have MAT. Amiodarone initiated. Echocardiogram 10/15 demonstrated normal LV function with an EF of 50-55%. AVR was functioning appropriately. Note had EGD 9/15 showing gastritis, duodenitis, hiatal hernia and stricture. Colonoscopy showed extensive diverticular disease and hemorrhoids. Admitted 11/15 with diastolic CHF that improved with diuresis; since last seen, He denies dyspnea, chest pain, palpitations or syncope. This with taking Lasix.   Studies: - LHC (1/06): Proximal RCA 30%, large thoracic aortic aneurysm (7 cm by CT scan) - Echo (09/2014): Mild concentric hypertrophy. EF 50% to 55%. Wall motion was normal. Grade 1 diastolic dysfunction. Aortic valve: A mechanical prosthesis was present. There was mildregurgitation. Mean gradient (S): 10 mm Hg. Peak gradient (S): 22 mm Hg.Moderate LAE, mild RAE - Carotid US (11/14): Bilateral 1-39% ICA  Current Outpatient Prescriptions  Medication Sig Dispense Refill  . alfuzosin (UROXATRAL) 10 MG 24 hr tablet Take 10 mg by mouth daily.     [name]. amiodarone (PACERONE) 200 MG tablet Take one and one-half tablet by mouth daily (Patient taking differently: Take 300 mg by mouth daily. Take one and one-half tablet by mouth daily) 45 tablet 3  . diltiazem (CARDIZEM CD) 300 MG 24 hr capsule Take 1 capsule (300 mg total) by mouth daily. 30 capsule 1  . finasteride (PROSCAR) 5 MG tablet Take 5 mg by mouth daily.    . furosemide (LASIX) 40 MG tablet Take 1 tablet (40 mg total) by mouth daily. 30 tablet 1  .  Lactobacillus (ACIDOPHILUS PO) Take 1 capsule by mouth 2 (two) times daily.    [name]. levothyroxine (SYNTHROID, LEVOTHROID) 75 MCG tablet Take 75 mcg by mouth daily before breakfast.    . omeprazole (PRILOSEC) 20 MG capsule Take 20 mg by mouth daily.    . polyethylene glycol (MIRALAX / GLYCOLAX) packet Take 17 g by mouth daily as needed. Until stooling regularly 30 packet 0  . Prenatal Vit-Fe Fumarate-FA (PRENATAL VITAMINS PLUS PO) Take 1 tablet by mouth daily.     . traMADol (ULTRAM) 50 MG tablet Take 100 mg by mouth 3 (three) times daily.     [name]. warfarin (COUMADIN) 4 MG tablet Take 4 mg by mouth daily.      No current facility-administered medications for this visit.     Past Medical History  Diagnosis Date  . Elevated PSA   . Aortic insufficiency   . Thoracic aortic aneurysm   . Carpal tunnel syndrome   . SVT (supraventricular tachycardia)   . Amaurosis fugax   . BPH (benign prostatic hypertrophy)   . Hypothyroidism   . Congestive heart failure (CHF)   . Atrial fibrillation   . Presence of permanent cardiac pacemaker     Medtronic  . Lower GI bleeding ~ 08/2014  . History of blood transfusion ~ 08/2014    related to LGIB  . Anemia   . History of gout     Past Surgical History  Procedure Laterality Date  . Insert / replace / remove pacemaker      Medtronic  . Median sternotomy    . Total knee arthroplasty Right   .  Carpal tunnel release Right   . Aortic valve replacement    . Joint replacement    . Inguinal hernia repair Bilateral   . Cardiac valve replacement      History   Social History  . Marital Status: Married    Spouse Name: N/A    Number of Children: 3  . Years of Education: N/A   Occupational History  . retired     Visual merchandiserfarmer   Social History Main Topics  . Smoking status: Never Smoker   . Smokeless tobacco: Never Used  . Alcohol Use: No  . Drug Use: No  . Sexual Activity: Not on file   Other Topics Concern  . Not on file   Social History Narrative     Lives with his wife who does the cooking and make sure he takes his medicines appropriately.    ROS: no fevers or chills, productive cough, hemoptysis, dysphasia, odynophagia, melena, hematochezia, dysuria, hematuria, rash, seizure activity, orthopnea, PND, pedal edema, claudication. Remaining systems are negative.  Physical Exam: Well-developed well-nourished in no acute distress.  Skin is warm and dry.  HEENT is normal.  Neck is supple.  Chest is clear to auscultation with normal expansion.  Cardiovascular exam is regular rate and rhythm. Crisp mechanical valve sound Abdominal exam nontender or distended. No masses palpated. Extremities show no edema. neuro grossly intact

## 2014-11-25 ENCOUNTER — Encounter: Payer: Self-pay | Admitting: Cardiology

## 2014-11-25 ENCOUNTER — Ambulatory Visit (INDEPENDENT_AMBULATORY_CARE_PROVIDER_SITE_OTHER): Payer: Medicare Other | Admitting: Cardiology

## 2014-11-25 ENCOUNTER — Ambulatory Visit (INDEPENDENT_AMBULATORY_CARE_PROVIDER_SITE_OTHER): Payer: Medicare Other | Admitting: *Deleted

## 2014-11-25 ENCOUNTER — Telehealth: Payer: Self-pay | Admitting: *Deleted

## 2014-11-25 VITALS — BP 120/50 | HR 84 | Ht 72.0 in | Wt 192.3 lb

## 2014-11-25 DIAGNOSIS — I359 Nonrheumatic aortic valve disorder, unspecified: Secondary | ICD-10-CM

## 2014-11-25 DIAGNOSIS — I10 Essential (primary) hypertension: Secondary | ICD-10-CM

## 2014-11-25 DIAGNOSIS — R0602 Shortness of breath: Secondary | ICD-10-CM

## 2014-11-25 DIAGNOSIS — I712 Thoracic aortic aneurysm, without rupture, unspecified: Secondary | ICD-10-CM

## 2014-11-25 DIAGNOSIS — Z7901 Long term (current) use of anticoagulants: Secondary | ICD-10-CM

## 2014-11-25 DIAGNOSIS — I480 Paroxysmal atrial fibrillation: Secondary | ICD-10-CM

## 2014-11-25 LAB — POCT INR: INR: 3.9

## 2014-11-25 MED ORDER — AMIODARONE HCL 200 MG PO TABS: 200.0000 mg | ORAL_TABLET | Freq: Every day | ORAL | Status: DC

## 2014-11-25 NOTE — Assessment & Plan Note (Signed)
Continue amiodarone. 

## 2014-11-25 NOTE — Assessment & Plan Note (Signed)
Blood pressure controlled. Continue present medications. 

## 2014-11-25 NOTE — Assessment & Plan Note (Signed)
Patient needs repeat CTA thoracic aorta. He does have renal insufficiency and I'm concerned about the possibility of contrast nephropathy. I will hold Lasix today, tomorrow and the following day. We will check his renal function on December 3 and if improved proceed with CTA on December 4. He will resume 20 mg of Lasix daily on December 4 and we will plan to check his renal function on December 7.

## 2014-11-25 NOTE — Assessment & Plan Note (Signed)
Patient feels as though he is becoming dehydrated. He feels weak after taking Lasix. I will decrease to 20 mg daily and he will take an additional 20 mg daily for weight gain of 2 pounds or increasing dyspnea.

## 2014-11-25 NOTE — Assessment & Plan Note (Signed)
Continue SBE prophylaxis. 

## 2014-11-25 NOTE — Assessment & Plan Note (Signed)
Followed by electrophysiology. 

## 2014-11-25 NOTE — Assessment & Plan Note (Signed)
Plan to continue Cardizem. Decrease amiodarone to 200 mg daily. Continue Coumadin. Patient remains in sinus on examination.

## 2014-11-25 NOTE — Patient Instructions (Signed)
Your physician recommends that you schedule a follow-up appointment in: 8 WEEKS WITH DR CRENSHAW  DO NOT TAKE FUROSEMIDE Tuesday, Wednesday, OR Thursday  Your physician recommends that you return for lab work ON Thursday  RESTART FUROSEMIDE Friday AFTER CT SCAN  DECREASE AMIODARONE TO 200 MG ONCE DAILY  Your physician [facility]

## 2014-11-25 NOTE — Telephone Encounter (Signed)
Had left a message for patient to return call to communicate warfarin dose instructions.  Wife returned call. Instructions were communicated and she repeated & verified them.   She will have their daughter call to make [name] [name] an appmt as she takes him to his appmts. Instructed her the appmt would need to be the week of Dec 14

## 2014-11-27 ENCOUNTER — Other Ambulatory Visit: Payer: Self-pay | Admitting: *Deleted

## 2014-11-27 DIAGNOSIS — I712 Thoracic aortic aneurysm, without rupture, unspecified: Secondary | ICD-10-CM

## 2014-11-27 LAB — BASIC METABOLIC PANEL WITH GFR
BUN: 21 mg/dL (ref 6–23)
CHLORIDE: 103 meq/L (ref 96–112)
CO2: 25 mEq/L (ref 19–32)
Calcium: 8.8 mg/dL (ref 8.4–10.5)
Creat: 1.62 mg/dL — ABNORMAL HIGH (ref 0.50–1.35)
GFR, EST NON AFRICAN AMERICAN: 38 mL/min — AB
GFR, Est African American: 44 mL/min — ABNORMAL LOW
Glucose, Bld: 119 mg/dL — ABNORMAL HIGH (ref 70–99)
Potassium: 4.2 mEq/L (ref 3.5–5.3)
Sodium: 140 mEq/L (ref 135–145)

## 2014-11-28 ENCOUNTER — Ambulatory Visit (INDEPENDENT_AMBULATORY_CARE_PROVIDER_SITE_OTHER)
Admission: RE | Admit: 2014-11-28 | Discharge: 2014-11-28 | Disposition: A | Discharge status: Home / Self Care | Payer: Medicare Other | Source: Ambulatory Visit | Attending: Cardiology | Admitting: Cardiology

## 2014-11-28 ENCOUNTER — Telehealth: Payer: Self-pay | Admitting: Nurse Practitioner

## 2014-11-28 ENCOUNTER — Ambulatory Visit: Payer: Medicare Other | Admitting: Pharmacist Clinician (PhC)/ Clinical Pharmacy Specialist

## 2014-11-28 DIAGNOSIS — I712 Thoracic aortic aneurysm, without rupture, unspecified: Secondary | ICD-10-CM

## 2014-11-28 DIAGNOSIS — I719 Aortic aneurysm of unspecified site, without rupture: Secondary | ICD-10-CM | POA: Insufficient documentation

## 2014-11-28 HISTORY — DX: Aortic aneurysm of unspecified site, without rupture: I71.9

## 2014-11-28 MED ORDER — IOHEXOL 350 MG/ML SOLN
80.0000 mL | Freq: Once | INTRAVENOUS | Status: AC | PRN
  Administered 2014-11-28: 80 mL via INTRAVENOUS

## 2014-11-28 NOTE — Telephone Encounter (Signed)
After speaking with [name]'s wife and Dr. Patty SermonsBrackbill, I discussed results of CT with Dr. Jens Somrenshaw.  It was noted that in the body of the report "There appears to be a new intimal flap along the right side of the descending aorta," however in the impression, "there appears to be a new intimal flap along the right side of the ascending thoracic aorta concerning for new small dissection."  I reviewed CT with Dr. Llana AlimentEntrikin in CT radiology.  The flap is in fact in the ascending thoracic aorta below the suture line.  He did not feel that this was an emergent situation and recommended CT surgery f/u as an outpt and f/u CT in 3 mos.  I again discussed these findings with Dr. Jens Somrenshaw and contacted pts wife re: results and advised that he does not have to come ED tonight so long as he is stable at home, which he is.  I will forward this note to Dr. Ludwig Clarksrenshaw's nurse to assist in arranging for f/u CT surgery appt with Dr. Cornelius Moraswen next week.  Nicolasa Duckinghristopher Jade Burkard, NP 11/28/2014, 7:00 PM  _____________   EXAM: CT ANGIOGRAPHY CHEST WITH CONTRAST  FINDINGS: No pneumothorax or significant pleural effusion is noted. Mild cardiomegaly is noted. Minimal right posterior basilar subsegmental atelectasis is noted. No significant osseous abnormality is noted. Left-sided pacemaker is again noted with leads in grossly good position.  Coronary artery calcifications are noted. Status post aortic valve replacement and aortic root graft placement. Ascending thoracic aorta has a maximum measured diameter 4.6 cm consistent with stable aneurysmal dilatation compared to prior exam. There appears to be a new intimal flap along the right side of the descending aorta best seen on image number 40 of series 4. This is not clearly identified on prior exam. No aneurysmal dilatation is seen involving the transverse aortic arch or descending thoracic aorta. No mediastinal mass or adenopathy is noted.  Visualized portion of abdomen  demonstrates stable left hepatic cyst compared to prior exam. No other significant abnormality seen in the visualized portion of the abdomen.  Review of the MIP images confirms the above findings.  IMPRESSION: Status post aortic valve replacement and aortic root graft placement. Stable aneurysmal dilatation of ascending thoracic aorta is noted with maximum measured diameter 4.6 cm.  However, there appears to be a new intimal flap along the right side of the ascending thoracic aorta concerning for new small dissection. These results will be called to the ordering clinician or representative by the Radiologist Assistant, and communication documented in the PACS or zVision Dashboard.

## 2014-11-28 NOTE — Telephone Encounter (Signed)
[facility]: CTA of the chest performed on [name] earlier today for evaluation of prior Ao root dilatation.  Results below:  Coronary artery calcifications are noted. Status post aortic valve replacement and aortic root graft placement. Ascending thoracic aorta has a maximum measured diameter 4.6 cm consistent with stable aneurysmal dilatation compared to prior exam. There appears to be a new intimal flap along the right side of the descending aorta best seen on image number 40 of series 4. This is not clearly identified on prior exam. No aneurysmal dilatation is seen involving the transverse aortic arch or descending thoracic aorta. No mediastinal mass or adenopathy is noted.  Visualized portion of abdomen demonstrates stable left hepatic cyst compared to prior exam. No other significant abnormality seen in the visualized portion of the abdomen.  I discussed report with Dr. Patty SermonsBrackbill and contacted the pts wife.  I advised that pt present to the ED tonight for vascular surgery eval.  Pts wife verbalized understanding and will contact their daughter to drive them here.  Pt is asymptomatic.  Nicolasa Duckinghristopher Blanchie Zeleznik, NP 11/28/2014 5:44 PM

## 2014-11-29 NOTE — Telephone Encounter (Signed)
[name] [name], Please arrange fu with Dr Cornelius Moraswen for small flap in ascending aorta. [name] [name]

## 2014-12-01 ENCOUNTER — Telehealth: Payer: Self-pay | Admitting: Cardiology

## 2014-12-01 DIAGNOSIS — Z79899 Other long term (current) drug therapy: Secondary | ICD-10-CM

## 2014-12-01 LAB — BASIC METABOLIC PANEL WITH GFR
BUN: 21 mg/dL (ref 6–23)
CHLORIDE: 104 meq/L (ref 96–112)
CO2: 23 mEq/L (ref 19–32)
Calcium: 8.8 mg/dL (ref 8.4–10.5)
Creat: 1.55 mg/dL — ABNORMAL HIGH (ref 0.50–1.35)
GFR, EST NON AFRICAN AMERICAN: 41 mL/min — AB
GFR, Est African American: 47 mL/min — ABNORMAL LOW
Glucose, Bld: 131 mg/dL — ABNORMAL HIGH (ref 70–99)
POTASSIUM: 4.2 meq/L (ref 3.5–5.3)
Sodium: 141 mEq/L (ref 135–145)

## 2014-12-01 NOTE — Telephone Encounter (Signed)
Patient had labs on 12/3 and was due for labs on Monday (today) to reassess renal function after CT scan.   "We will check his renal function on December 3 and if improved proceed with CTA on December 4. He will resume 20 mg of Lasix daily on December 4 and we will plan to check his renal function on December 7." >> from Dr. Ludwig Clarksrenshaw's note  BMP with GFR ordered

## 2014-12-01 NOTE — Telephone Encounter (Signed)
Lab orders are needed for pt. Pt is in the lab right now

## 2014-12-02 NOTE — Telephone Encounter (Signed)
Spoke with pt, aware order placed for referral

## 2014-12-02 NOTE — Addendum Note (Signed)
Addended by: Freddi StarrMATHIS, DEBRA W on: 12/02/2014 11:50 AM   Modules accepted: Orders

## 2014-12-08 ENCOUNTER — Ambulatory Visit (INDEPENDENT_AMBULATORY_CARE_PROVIDER_SITE_OTHER): Payer: Medicare Other | Admitting: Pharmacist Clinician (PhC)/ Clinical Pharmacy Specialist

## 2014-12-08 DIAGNOSIS — Z7901 Long term (current) use of anticoagulants: Secondary | ICD-10-CM

## 2014-12-08 DIAGNOSIS — I359 Nonrheumatic aortic valve disorder, unspecified: Secondary | ICD-10-CM

## 2014-12-08 LAB — POCT INR: INR: 2.9

## 2014-12-09 ENCOUNTER — Institutional Professional Consult (permissible substitution) (INDEPENDENT_AMBULATORY_CARE_PROVIDER_SITE_OTHER): Payer: Medicare Other | Admitting: Thoracic Surgery (Cardiothoracic Vascular Surgery)

## 2014-12-09 ENCOUNTER — Encounter: Payer: Self-pay | Admitting: Thoracic Surgery (Cardiothoracic Vascular Surgery)

## 2014-12-09 VITALS — BP 93/53 | HR 82 | Resp 16 | Ht 72.0 in | Wt 190.0 lb

## 2014-12-09 DIAGNOSIS — I712 Thoracic aortic aneurysm, without rupture, unspecified: Secondary | ICD-10-CM

## 2014-12-09 DIAGNOSIS — Z952 Presence of prosthetic heart valve: Secondary | ICD-10-CM

## 2014-12-09 DIAGNOSIS — Z954 Presence of other heart-valve replacement: Secondary | ICD-10-CM

## 2014-12-09 DIAGNOSIS — I7121 Aneurysm of the ascending aorta, without rupture: Secondary | ICD-10-CM | POA: Insufficient documentation

## 2014-12-09 NOTE — Progress Notes (Signed)
[address]       [place] [id number]             [phone]     CARDIOTHORACIC SURGERY CONSULTATION REPORT  Referring Provider is Jens Somrenshaw, Madolyn FriezeBrian S, MD PCP is Abigail MiyamotoPERRY,LAWRENCE EDWARD, MD  Chief Complaint  Patient presents with  . TAA    eval and treat.[name].[name].CTA CHEST 11/28/14    HPI:  Patient is an 80 year old white male who underwent Bentall aortic root replacement using a mechanical valve conduit in 2006 for aortic insufficiency with ascending thoracic aortic aneurysm. He has been followed by Dr. Jens Somrenshaw for several years with chronic Coumadin therapy for his mechanical valve, paroxysmal atrial fibrillation and SVT with sinus bradycardia requiring permanent pacemaker implantation, hypertension, hypothyroidism, chronic diastolic congestive heart failure and degenerative arthritis. Over the past year the patient has declined substantially.  He reports progressive exertional shortness of breath and fatigue as well as worsening pain and decreased activity related to his left knee. He suffers from severe chronic degenerative arthritis involving his left knee which limits his physical mobility considerably.  He was hospitalized in September with severe iron deficient anemia and found to have gastritis, duodenitis, and hiatal hernia on EGD with extensive diverticulosis on colonoscopy.  He was seen in follow up by Dr Ladona Ridgelaylor in October and started on amiodarone in the fall for treatment of atrial fibrillation with multifocal atrial tachycardia. Transthoracic echocardiogram demonstrated preserved left ventricular systolic function with normal functioning mechanical valve in the aortic position.  The patient was hospitalized in November with acute exacerbation of chronic diastolic congestive heart failure.  He was seen in follow-up by Dr. Jens Somrenshaw in early December and CT angiogram of the chest was obtained.  This revealed stable mild aneurysmal enlargement of the distal portion of  the ascending thoracic aorta with maximum transverse diameter 4.6 cm. However, there appeared to be a "new intimal flap" Along the right side of the ascending thoracic aorta which raised the question of a possible "new small dissection". The remainder of the thoracic aorta appeared unchanged from previous CT angiography.  The patient was referred for surgical consultation.  The patient is married and lives [place] in [place]. He is a retired Engineer, maintenancedairy farmer. From a functional standpoint he has declined significantly over the past year with severe chronic pain in his left knee and worsening exertional shortness of breath and fatigue. He now walks short distances using a cane.  He gets short of breath with mild to moderate physical activity. He denies resting shortness of breath, PND, orthopnea, or lower extremity edema. He has had occasional dizzy spells and he passed out approximately 21 year ago. He has never had any chest pain, chest tightness or back pain either with activity or at rest.  Past Medical History  Diagnosis Date  . Elevated PSA   . Aortic insufficiency   . Thoracic aortic aneurysm   . Carpal tunnel syndrome   . SVT (supraventricular tachycardia)   . Amaurosis fugax   . BPH (benign prostatic hypertrophy)   . Hypothyroidism   . Congestive heart failure (CHF)   . Atrial fibrillation   . Presence of permanent cardiac pacemaker     Medtronic  . Lower GI bleeding ~ 08/2014  . History of blood transfusion ~ 08/2014    related to LGIB  . Anemia   . History of gout   . S/P Bentall aortic root replacement with mechanical valve conduit 01/19/2005  27 mm Carboseal Valsalva mechanical valve with synthetic root and ascending thoracic aortic graft with reimplantation of left main and right coronary arteries    Past Surgical History  Procedure Laterality Date  . Insert / replace / remove pacemaker      Medtronic  . Median sternotomy    . Total knee arthroplasty Right   . Carpal  tunnel release Right   . Joint replacement    . Inguinal hernia repair Bilateral   . Mechanical aortic valve replacement  01/19/2005    27 mm Carbomedics  . Ascending aortic aneurysm repair w/ mechanical aortic valve replacement  01/19/2005    27 mm Carboseal Valsalva valve conduit    Family History  Problem Relation Age of Onset  . Hypertension Mother   . Stroke Father   . Dementia Sister     youngest sister  . Heart disease Brother     History   Social History  . Marital Status: Married    Spouse Name: N/A    Number of Children: 3  . Years of Education: N/A   Occupational History  . retired     Visual merchandiser   Social History Main Topics  . Smoking status: Never Smoker   . Smokeless tobacco: Never Used  . Alcohol Use: No  . Drug Use: No  . Sexual Activity: Not on file   Other Topics Concern  . Not on file   Social History Narrative   Lives with his wife who does the cooking and make sure he takes his medicines appropriately.    Current Outpatient Prescriptions  Medication Sig Dispense Refill  . alfuzosin (UROXATRAL) 10 MG 24 hr tablet Take 10 mg by mouth daily.     [name] amiodarone (PACERONE) 200 MG tablet Take 1 tablet (200 mg total) by mouth daily. 45 tablet 3  . diltiazem (CARDIZEM CD) 300 MG 24 hr capsule Take 1 capsule (300 mg total) by mouth daily. 30 capsule 1  . finasteride (PROSCAR) 5 MG tablet Take 5 mg by mouth daily.    . furosemide (LASIX) 20 MG tablet Take 20 mg by mouth daily.    . Lactobacillus (ACIDOPHILUS PO) Take 1 capsule by mouth 2 (two) times daily.    [name] levothyroxine (SYNTHROID, LEVOTHROID) 75 MCG tablet Take 75 mcg by mouth daily before breakfast.    . omeprazole (PRILOSEC) 20 MG capsule Take 20 mg by mouth daily.    . polyethylene glycol (MIRALAX / GLYCOLAX) packet Take 17 g by mouth daily as needed. Until stooling regularly 30 packet 0  . Prenatal Vit-Fe Fumarate-FA (PRENATAL VITAMINS PLUS PO) Take 1 tablet by mouth daily.     . traMADol (ULTRAM)  50 MG tablet Take 100 mg by mouth 3 (three) times daily.     [name] warfarin (COUMADIN) 4 MG tablet Take 4 mg by mouth daily. EXCEPT WED. TAKE 2 MG     No current facility-administered medications for this visit.    No Known Allergies    Review of Systems:   General:  decreased appetite, decreased energy, no weight gain, no weight loss, no fever  Cardiac:  no chest pain with exertion, no chest pain at rest, + SOB with exertion, no resting SOB, no PND, no orthopnea, + palpitations, + arrhythmia, + atrial fibrillation, no LE edema, + dizzy spells, + syncope  Respiratory:  + shortness of breath, no home oxygen, no productive cough, no dry cough, no bronchitis, no wheezing, no hemoptysis, no asthma, no pain with inspiration  or cough, no sleep apnea, no CPAP at night  GI:   no difficulty swallowing, no reflux, no frequent heartburn, no hiatal hernia, no abdominal pain, + constipation, no diarrhea, no hematochezia, no hematemesis, no melena  GU:   no dysuria,  no frequency, no urinary tract infection, no hematuria, no enlarged prostate, no kidney stones, no kidney disease  Vascular:  no pain suggestive of claudication, no pain in feet, no leg cramps, no varicose veins, no DVT, no non-healing foot ulcer  Neuro:   no stroke, no TIA's, no seizures, no headaches, no temporary blindness one eye,  no slurred speech, no peripheral neuropathy, + chronic pain, + instability of gait, no memory/cognitive dysfunction  Musculoskeletal: + arthritis, + joint swelling, no myalgias, + difficulty walking, decreased mobility   Skin:   no rash, no itching, no skin infections, no pressure sores or ulcerations  Psych:   no anxiety, no depression, no nervousness, no unusual recent stress  Eyes:   no blurry vision, no floaters, no recent vision changes, + wears glasses or contacts  ENT:   no hearing loss, no loose or painful teeth, + dentures, last saw dentist within the past year  Hematologic:  + easy bruising, no abnormal  bleeding, no clotting disorder, no frequent epistaxis  Endocrine:  no diabetes, does not check CBG's at home     Physical Exam:   BP 93/53 mmHg  Pulse 82  Resp 16  Ht 6' (1.829 m)  Wt 190 lb (86.183 kg)  BMI 25.76 kg/m2  SpO2 97%  General:  Elderly and somewhat frail-appearing  HEENT:  Unremarkable   Neck:   no JVD, no bruits, no adenopathy   Chest:   clear to auscultation, symmetrical breath sounds, no wheezes, no rhonchi   CV:   Irregular rate and rhythm w/ mechanical heart sounds, no murmur   Abdomen:  soft, non-tender, no masses   Extremities:  warm, well-perfused, pulses diminished, no LE edema  Rectal/GU  Deferred  Neuro:   Grossly non-focal and symmetrical throughout  Skin:   Clean and dry, no rashes, no breakdown   Diagnostic Tests:  CT ANGIOGRAPHY CHEST WITH CONTRAST  TECHNIQUE: Multidetector CT imaging of the chest was performed using the standard protocol during bolus administration of intravenous contrast. Multiplanar CT image reconstructions and MIPs were obtained to evaluate the vascular anatomy.  CONTRAST: 80mL OMNIPAQUE IOHEXOL 350 MG/ML SOLN  COMPARISON: CT scan of November 13, 2013.  FINDINGS: No pneumothorax or significant pleural effusion is noted. Mild cardiomegaly is noted. Minimal right posterior basilar subsegmental atelectasis is noted. No significant osseous abnormality is noted. Left-sided pacemaker is again noted with leads in grossly good position.  Coronary artery calcifications are noted. Status post aortic valve replacement and aortic root graft placement. Ascending thoracic aorta has a maximum measured diameter 4.6 cm consistent with stable aneurysmal dilatation compared to prior exam. There appears to be a new intimal flap along the right side of the descending aorta best seen on image number 40 of series 4. This is not clearly identified on prior exam. No aneurysmal dilatation is seen involving the transverse aortic arch  or descending thoracic aorta. No mediastinal mass or adenopathy is noted.  Visualized portion of abdomen demonstrates stable left hepatic cyst compared to prior exam. No other significant abnormality seen in the visualized portion of the abdomen.  Review of the MIP images confirms the above findings.  IMPRESSION: Status post aortic valve replacement and aortic root graft placement. Stable aneurysmal dilatation of  ascending thoracic aorta is noted with maximum measured diameter 4.6 cm.  However, there appears to be a new intimal flap along the right side of the ascending thoracic aorta concerning for new small dissection. These results will be called to the ordering clinician or representative by the Radiologist Assistant, and communication documented in the PACS or zVision Dashboard.   Electronically Signed  By: Roque LiasJames Green M.D.  On: 11/28/2014 16:57    Impression:  I have personally reviewed the patient's recent CT angiogram of the chest and directly reviewed it with the previous scan from November 2014.  Patient may have a very small false aneurysm or localized aortic dissection involving the distal anastomosis of his ascending thoracic aortic graft, having previously undergone Bentall aortic root replacement with resection and grafting of ascending thoracic aortic aneurysm.  The distal portion of the ascending thoracic aorta otherwise appears stable with no sign of any delayed aneurysmal enlargement.  In retrospect, this area looked similar on the previous scan, although these findings may be either caused by or affected by motion artifact.  Even if there is a small false aneurysm or localized dissection present, I would not recommend surgical intervention at this time.  The patient's blood pressure appears to be under adequate control, although it might be wise to consider adding a beta blocker to his medical therapy.   Plan:  Will plan follow up CT angiogram in 3  months.  It probably would be best to perform the next scan using cardiac-gating to minimize the possibility of motion artifact.  I have encouraged the patient and his wife to monitor his blood pressure carefully and follow up with Dr. Jens Somrenshaw for any subsequent changes in his medications.   I spent in excess of 45 minutes during the conduct of this office consultation and >50% of this time involved direct face-to-face encounter with the patient for counseling and/or coordination of their care.   Salvatore Decentlarence H. Cornelius Moraswen, MD 12/09/2014 3:10 PM

## 2014-12-09 NOTE — Patient Instructions (Signed)
Keep a close monitor on your blood pressure and adjust medications per Dr Jens Somrenshaw

## 2014-12-10 ENCOUNTER — Encounter: Payer: Self-pay | Admitting: Cardiology

## 2014-12-17 ENCOUNTER — Ambulatory Visit (INDEPENDENT_AMBULATORY_CARE_PROVIDER_SITE_OTHER): Payer: Medicare Other | Admitting: *Deleted

## 2014-12-17 ENCOUNTER — Encounter: Payer: Self-pay | Admitting: Internal Medicine

## 2014-12-17 DIAGNOSIS — Z950 Presence of cardiac pacemaker: Secondary | ICD-10-CM

## 2014-12-17 LAB — MDC_IDC_ENUM_SESS_TYPE_REMOTE
Brady Statistic AP VP Percent: 1 %
Brady Statistic AS VP Percent: 1 %
Lead Channel Impedance Value: 480 Ohm
Lead Channel Impedance Value: 520 Ohm
Lead Channel Sensing Intrinsic Amplitude: 5.8 mV
Lead Channel Setting Pacing Amplitude: 2 V
Lead Channel Setting Pacing Amplitude: 2.5 V
Lead Channel Setting Pacing Pulse Width: 0.4 ms
Lead Channel Setting Sensing Sensitivity: 0.9 mV
MDC IDC MSMT BATTERY VOLTAGE: 2.86 V
MDC IDC MSMT LEADCHNL RA SENSING INTR AMPL: 2.9 mV
MDC IDC SET ZONE DETECTION INTERVAL: 400 ms
MDC IDC STAT BRADY AP VS PERCENT: 3709 %
MDC IDC STAT BRADY AS VS PERCENT: 62 %
Zone Setting Detection Interval: 350 ms

## 2014-12-17 NOTE — Progress Notes (Signed)
Remote pacemaker transmission.   

## 2014-12-22 ENCOUNTER — Encounter: Payer: Self-pay | Admitting: Internal Medicine

## 2014-12-23 ENCOUNTER — Encounter: Payer: Self-pay | Admitting: Cardiology

## 2015-01-05 ENCOUNTER — Ambulatory Visit (INDEPENDENT_AMBULATORY_CARE_PROVIDER_SITE_OTHER): Payer: Medicare Other | Admitting: Pharmacist Clinician (PhC)/ Clinical Pharmacy Specialist

## 2015-01-05 DIAGNOSIS — Z7901 Long term (current) use of anticoagulants: Secondary | ICD-10-CM

## 2015-01-05 DIAGNOSIS — I359 Nonrheumatic aortic valve disorder, unspecified: Secondary | ICD-10-CM

## 2015-01-05 LAB — POCT INR: INR: 2.7

## 2015-01-20 ENCOUNTER — Encounter: Payer: Self-pay | Admitting: Internal Medicine

## 2015-01-20 ENCOUNTER — Ambulatory Visit (INDEPENDENT_AMBULATORY_CARE_PROVIDER_SITE_OTHER): Payer: Medicare Other | Admitting: Internal Medicine

## 2015-01-20 VITALS — BP 134/48 | HR 83 | Ht 72.0 in | Wt 197.2 lb

## 2015-01-20 DIAGNOSIS — I471 Supraventricular tachycardia: Secondary | ICD-10-CM

## 2015-01-20 DIAGNOSIS — I495 Sick sinus syndrome: Secondary | ICD-10-CM

## 2015-01-20 DIAGNOSIS — I480 Paroxysmal atrial fibrillation: Secondary | ICD-10-CM

## 2015-01-20 DIAGNOSIS — I7121 Aneurysm of the ascending aorta, without rupture: Secondary | ICD-10-CM

## 2015-01-20 DIAGNOSIS — Z950 Presence of cardiac pacemaker: Secondary | ICD-10-CM

## 2015-01-20 DIAGNOSIS — I712 Thoracic aortic aneurysm, without rupture: Secondary | ICD-10-CM

## 2015-01-20 LAB — MDC_IDC_ENUM_SESS_TYPE_INCLINIC
Battery Voltage: 2.86 V
Brady Statistic AP VP Percent: 0.14 %
Brady Statistic AP VS Percent: 45.73 %
Brady Statistic AS VP Percent: 0.38 %
Brady Statistic AS VS Percent: 53.76 %
Brady Statistic RA Percent Paced: 45.86 %
Brady Statistic RV Percent Paced: 0.52 %
Date Time Interrogation Session: 20160126130524
Lead Channel Impedance Value: 504 Ohm
Lead Channel Impedance Value: 528 Ohm
Lead Channel Pacing Threshold Amplitude: 1 V
Lead Channel Pacing Threshold Amplitude: 1 V
Lead Channel Pacing Threshold Pulse Width: 0.4 ms
Lead Channel Pacing Threshold Pulse Width: 0.4 ms
Lead Channel Sensing Intrinsic Amplitude: 3.1909
Lead Channel Sensing Intrinsic Amplitude: 8.528 mV
Lead Channel Setting Pacing Amplitude: 2 V
Lead Channel Setting Pacing Amplitude: 2.5 V
Lead Channel Setting Pacing Pulse Width: 0.4 ms
Lead Channel Setting Sensing Sensitivity: 0.9 mV
Zone Setting Detection Interval: 350 ms
Zone Setting Detection Interval: 400 ms

## 2015-01-20 NOTE — Assessment & Plan Note (Signed)
He appears to be maintaining NSR on low dose amiodarone. Will follow.

## 2015-01-20 NOTE — Assessment & Plan Note (Signed)
[facility]. Will recheck PPM in several months.

## 2015-01-20 NOTE — Assessment & Plan Note (Signed)
He has no chest pain. He is undergoing surveillance by CVTS.

## 2015-01-20 NOTE — Patient Instructions (Signed)
Your physician wants you to follow-up in: 12 months with Dr. Court Joyaylor You will receive a reminder letter in the mail two months in advance. If you don't receive a letter, please call our office to schedule the follow-up appointment.  Remote monitoring is used to monitor your Pacemaker of ICD from home. This monitoring reduces the number of office visits required to check your device to one time per year. It allows us to keep an eye on the functioning of your device to ensure it is working properly. You are scheduled for a device check from home on 02/19/15. You may send your transmission at any time that day. If you have a wireless device, the transmission will be sent automatically. After your physician reviews your transmission, you will receive a postcard with your next transmission date.

## 2015-01-20 NOTE — Progress Notes (Signed)
HPI [name] returns today for followup. He is a very pleasant 81 year old man with a history of symptomatic sinus bradycardia secondary to sinus node dysfunction, status post permanent pacemaker insertion, paroxysmal atrial fibrillation and flutter, and hypertension. He has a history of aortic insufficiency and is status post aortic valve replacement. He was recently hospitalized with GI bleeding, secondary to gastritis and duodenitis. He also notices shortness of breath and dizziness. At times his heart rate appears to be elevated. Finally, he has an aortic aneurysm.  No Known Allergies   Current Outpatient Prescriptions  Medication Sig Dispense Refill  . alfuzosin (UROXATRAL) 10 MG 24 hr tablet Take 10 mg by mouth daily.     [name]. amiodarone (PACERONE) 200 MG tablet Take 1 tablet (200 mg total) by mouth daily. 45 tablet 3  . diltiazem (CARDIZEM CD) 300 MG 24 hr capsule Take 1 capsule (300 mg total) by mouth daily. 30 capsule 1  . finasteride (PROSCAR) 5 MG tablet Take 5 mg by mouth daily.    . furosemide (LASIX) 20 MG tablet Take 20 mg by mouth daily. Pt can take 40 mg by mouth daily as needed for weight gain    . Lactobacillus (ACIDOPHILUS PO) Take 1 capsule by mouth 2 (two) times daily.    [name]. levothyroxine (SYNTHROID, LEVOTHROID) 75 MCG tablet Take 75 mcg by mouth daily before breakfast.    . omeprazole (PRILOSEC) 20 MG capsule Take 20 mg by mouth daily.    . polyethylene glycol (MIRALAX / GLYCOLAX) packet Take 17 g by mouth daily as needed. Until stooling regularly 30 packet 0  . traMADol (ULTRAM) 50 MG tablet Take 100 mg by mouth 3 (three) times daily.     [name]. warfarin (COUMADIN) 4 MG tablet Take 4 mg by mouth daily. EXCEPT WED. TAKE 2 MG     No current facility-administered medications for this visit.     Past Medical History  Diagnosis Date  . Elevated PSA   . Aortic insufficiency   . Thoracic aortic aneurysm   . Carpal tunnel syndrome   . SVT (supraventricular tachycardia)   .  Amaurosis fugax   . BPH (benign prostatic hypertrophy)   . Hypothyroidism   . Congestive heart failure (CHF)   . Atrial fibrillation   . Presence of permanent cardiac pacemaker     Medtronic  . Lower GI bleeding ~ 08/2014  . History of blood transfusion ~ 08/2014    related to LGIB  . Anemia   . History of gout   . 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   Past Surgical History  Procedure Laterality Date  . Insert / replace / remove pacemaker      Medtronic  . Median sternotomy    . Total knee arthroplasty Right   . Carpal tunnel release Right   . Joint replacement    . Inguinal hernia repair Bilateral   . Mechanical aortic valve replacement  01/19/2005    27 mm Carbomedics  . Ascending aortic aneurysm repair w/ mechanical aortic valve replacement  01/19/2005    27 mm Carboseal Valsalva valve conduit     Family History  Problem Relation Age of Onset  . Hypertension Mother   . Stroke Father   .  Dementia Sister     youngest sister  . Heart disease Brother      History   Social History  . Marital Status: Married    Spouse Name: N/A    Number of Children: 3  . Years of Education: N/A   Occupational History  . retired     Visual merchandiser   Social History Main Topics  . Smoking status: Never Smoker   . Smokeless tobacco: Never Used  . Alcohol Use: No  . Drug Use: No  . Sexual Activity: Not on file   Other Topics Concern  . Not on file   Social History Narrative   Lives with his wife who does the cooking and make sure he takes his medicines appropriately.     BP 134/48 mmHg  Pulse 83  Ht 6' (1.829 m)  Wt 197 lb 3.2 oz (89.449 kg)  BMI 26.74 kg/m2  Physical Exam:  Chronically ill appearing  elderly man, NAD HEENT: Unremarkable Neck:  7 cm JVD, no thyromegally Lungs:  Clear with no wheezes, rales, or rhonchi. Well-healed pacemaker incision HEART:  Regular rate rhythm, soft systolic murmur at the base, no rubs, no clicks Abd:  soft, positive bowel sounds, no organomegally, no rebound, no guarding Ext:  2 plus pulses, no edema, no cyanosis, no clubbing Skin:  No rashes no nodules Neuro:  CN II through XII intact, motor grossly intact  DEVICE  Normal device function.  See PaceArt for details.   Assess/Plan:

## 2015-01-24 NOTE — Progress Notes (Signed)
HPI: FU AVR; hx of aortic insufficiency and ascending thoracic aortic aneurysm s/p Bentall procedure with mechanical AVR in 2006, sinus bradycardia status post permanent pacemaker implantation, PAF, SVT, HTN, hypothyroidism.Patient was seen by Dr. Ladona Ridgelaylor 10/15 with recurrent and increasingly symptomatic atrial fibrillation. He has also been noted to have MAT. Amiodarone initiated. Echocardiogram 10/15 demonstrated normal LV function with an EF of 50-55%. AVR was functioning appropriately. Note had EGD 9/15 showing gastritis, duodenitis, hiatal hernia and stricture. Colonoscopy showed extensive diverticular disease and hemorrhoids. CTA 12/15-s/p AVR, ascending aorta 4.6 cm; new small dissection. Seen by Dr Cornelius Moraswen and FU CT recommended in 3 months; since last seen, he feels weak today. He attributes this to taking 40 mg of Lasix yesterday. He felt this way previously on higher doses of diuretic. He has mild dyspnea with more moderate activities. No orthopnea, PND, pedal edema, chest pain or bleeding.   Studies: - LHC (1/06): Proximal RCA 30%, large thoracic aortic aneurysm (7 cm by CT scan) - Echo (09/2014): Mild concentric hypertrophy. EF 50% to 55%. Wall motion was normal. Grade 1 diastolic dysfunction. Aortic valve: A mechanical prosthesis was present. There was mildregurgitation. Mean gradient (S): 10 mm Hg. Peak gradient (S): 22 mm Hg.Moderate LAE, mild RAE - Carotid US (11/14): Bilateral 1-39% ICA  Current Outpatient Prescriptions  Medication Sig Dispense Refill  . alfuzosin (UROXATRAL) 10 MG 24 hr tablet Take 10 mg by mouth daily.     [name]. amiodarone (PACERONE) 200 MG tablet Take 1 tablet (200 mg total) by mouth daily. 45 tablet 3  . diltiazem (CARDIZEM CD) 300 MG 24 hr capsule Take 1 capsule (300 mg total) by mouth daily. 30 capsule 1  . finasteride (PROSCAR) 5 MG tablet Take 5 mg by mouth daily.    . furosemide (LASIX) 20 MG tablet Take 20 mg by mouth daily. Pt can take 40 mg by  mouth daily as needed for weight gain    . Lactobacillus (ACIDOPHILUS PO) Take 1 capsule by mouth 2 (two) times daily.    [name]. levothyroxine (SYNTHROID, LEVOTHROID) 75 MCG tablet Take 75 mcg by mouth daily before breakfast.    . omeprazole (PRILOSEC) 20 MG capsule Take 20 mg by mouth daily.    . polyethylene glycol (MIRALAX / GLYCOLAX) packet Take 17 g by mouth daily as needed. Until stooling regularly 30 packet 0  . traMADol (ULTRAM) 50 MG tablet Take 100 mg by mouth 3 (three) times daily.     [name]. warfarin (COUMADIN) 4 MG tablet Take 4 mg by mouth daily. EXCEPT WED. TAKE 2 MG     No current facility-administered medications for this visit.     Past Medical History  Diagnosis Date  . Elevated PSA   . Aortic insufficiency   . Thoracic aortic aneurysm   . Carpal tunnel syndrome   . SVT (supraventricular tachycardia)   . Amaurosis fugax   . BPH (benign prostatic hypertrophy)   . Hypothyroidism   . Congestive heart failure (CHF)   . Atrial fibrillation   . Presence of permanent cardiac pacemaker     Medtronic  . Lower GI bleeding ~ 08/2014  . History of blood transfusion ~ 08/2014    related to LGIB  . Anemia   . History of gout   . 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 Insert / replace / remove pacemaker      Medtronic  . Median sternotomy    . Total knee arthroplasty Right   . Carpal tunnel release Right   . Joint replacement    . Inguinal hernia repair Bilateral   . Mechanical aortic valve replacement  01/19/2005    27 mm Carbomedics  . Ascending aortic aneurysm repair w/ mechanical aortic valve replacement  01/19/2005    27 mm Carboseal Valsalva valve conduit    History   Social History  . Marital Status: Married     Spouse Name: N/A    Number of Children: 3  . Years of Education: N/A   Occupational History  . retired     Visual merchandiser   Social History Main Topics  . Smoking status: Never Smoker   . Smokeless tobacco: Never Used  . Alcohol Use: No  . Drug Use: No  . Sexual Activity: Not on file   Other Topics Concern  . Not on file   Social History Narrative   Lives with his wife who does the cooking and make sure he takes his medicines appropriately.    ROS: no fevers or chills, productive cough, hemoptysis, dysphasia, odynophagia, melena, hematochezia, dysuria, hematuria, rash, seizure activity, orthopnea, PND, pedal edema, claudication. Remaining systems are negative.  Physical Exam: Well-developed well-nourished in no acute distress.  Skin is warm and dry.  HEENT is normal.  Neck is supple.  Chest is clear to auscultation with normal expansion.  Cardiovascular exam is regular rate and rhythm.  Abdominal exam nontender or distended. No masses palpated. Extremities show no edema. neuro grossly intact

## 2015-01-26 ENCOUNTER — Ambulatory Visit (INDEPENDENT_AMBULATORY_CARE_PROVIDER_SITE_OTHER): Payer: Medicare Other | Admitting: Pharmacist Clinician (PhC)/ Clinical Pharmacy Specialist

## 2015-01-26 ENCOUNTER — Ambulatory Visit (INDEPENDENT_AMBULATORY_CARE_PROVIDER_SITE_OTHER): Payer: Medicare Other | Admitting: Cardiology

## 2015-01-26 ENCOUNTER — Encounter: Payer: Self-pay | Admitting: Cardiology

## 2015-01-26 VITALS — BP 120/70 | HR 80 | Ht 72.0 in | Wt 197.0 lb

## 2015-01-26 DIAGNOSIS — Z950 Presence of cardiac pacemaker: Secondary | ICD-10-CM

## 2015-01-26 DIAGNOSIS — I5033 Acute on chronic diastolic (congestive) heart failure: Secondary | ICD-10-CM

## 2015-01-26 DIAGNOSIS — Z7901 Long term (current) use of anticoagulants: Secondary | ICD-10-CM | POA: Diagnosis not present

## 2015-01-26 DIAGNOSIS — I359 Nonrheumatic aortic valve disorder, unspecified: Secondary | ICD-10-CM | POA: Diagnosis not present

## 2015-01-26 DIAGNOSIS — I10 Essential (primary) hypertension: Secondary | ICD-10-CM

## 2015-01-26 DIAGNOSIS — I480 Paroxysmal atrial fibrillation: Secondary | ICD-10-CM

## 2015-01-26 DIAGNOSIS — I471 Supraventricular tachycardia: Secondary | ICD-10-CM

## 2015-01-26 LAB — BASIC METABOLIC PANEL WITH GFR
BUN: 24 mg/dL — AB (ref 6–23)
CHLORIDE: 101 meq/L (ref 96–112)
CO2: 26 mEq/L (ref 19–32)
CREATININE: 1.81 mg/dL — AB (ref 0.50–1.35)
Calcium: 8.9 mg/dL (ref 8.4–10.5)
GFR, EST AFRICAN AMERICAN: 39 mL/min — AB
GFR, Est Non African American: 34 mL/min — ABNORMAL LOW
Glucose, Bld: 102 mg/dL — ABNORMAL HIGH (ref 70–99)
POTASSIUM: 4.4 meq/L (ref 3.5–5.3)
SODIUM: 140 meq/L (ref 135–145)

## 2015-01-26 LAB — HEPATIC FUNCTION PANEL
ALT: <8 U/L (ref 0–53)
AST: 16 U/L (ref 0–37)
Albumin: 3.6 g/dL (ref 3.5–5.2)
Alkaline Phosphatase: 90 U/L (ref 39–117)
Bilirubin, Direct: 0.1 mg/dL (ref 0.0–0.3)
Indirect Bilirubin: 0.8 mg/dL (ref 0.2–1.2)
TOTAL PROTEIN: 5.9 g/dL — AB (ref 6.0–8.3)
Total Bilirubin: 0.9 mg/dL (ref 0.2–1.2)

## 2015-01-26 LAB — CBC
HCT: 40.2 % (ref 39.0–52.0)
HEMOGLOBIN: 12.8 g/dL — AB (ref 13.0–17.0)
MCH: 24.8 pg — ABNORMAL LOW (ref 26.0–34.0)
MCHC: 31.8 g/dL (ref 30.0–36.0)
MCV: 77.9 fL — AB (ref 78.0–100.0)
MPV: 8.7 fL (ref 8.6–12.4)
Platelets: 229 10*3/uL (ref 150–400)
RBC: 5.16 MIL/uL (ref 4.22–5.81)
RDW: 18.3 % — ABNORMAL HIGH (ref 11.5–15.5)
WBC: 8.7 10*3/uL (ref 4.0–10.5)

## 2015-01-26 LAB — POCT INR: INR: 2.5

## 2015-01-26 NOTE — Assessment & Plan Note (Signed)
Continue SBE prophylaxis. 

## 2015-01-26 NOTE — Assessment & Plan Note (Signed)
Patient has been seen by Dr. Cornelius Moraswen. Follow-up CTA arranged for April of this year.

## 2015-01-26 NOTE — Assessment & Plan Note (Addendum)
Patient feels weak today and attributes to a higher dose of Lasix yesterday. He will not take any Lasix today and resume 20 mg tomorrow. Check potassium and renal function.

## 2015-01-26 NOTE — Assessment & Plan Note (Signed)
Management per electrophysiology. 

## 2015-01-26 NOTE — Assessment & Plan Note (Signed)
Blood pressure controlled. Continue present medications. 

## 2015-01-26 NOTE — Assessment & Plan Note (Signed)
Continue amiodarone. Check TSH and liver functions. Given complaints of weakness I will also check hemoglobin although no bleeding by history.

## 2015-01-26 NOTE — Patient Instructions (Signed)
Your physician recommends that you schedule a follow-up appointment in: 3 MONTHS WITH DR CRENSHAW  Your physician recommends that you HAVE LAB WORK TODAY  

## 2015-01-26 NOTE — Assessment & Plan Note (Signed)
Continue amiodarone. 

## 2015-01-27 LAB — TSH: TSH: 4.241 u[IU]/mL (ref 0.350–4.500)

## 2015-02-09 ENCOUNTER — Other Ambulatory Visit: Payer: Self-pay | Admitting: *Deleted

## 2015-02-09 DIAGNOSIS — I712 Thoracic aortic aneurysm, without rupture, unspecified: Secondary | ICD-10-CM

## 2015-02-12 ENCOUNTER — Other Ambulatory Visit: Payer: Self-pay | Admitting: *Deleted

## 2015-02-12 DIAGNOSIS — I350 Nonrheumatic aortic (valve) stenosis: Secondary | ICD-10-CM

## 2015-02-16 ENCOUNTER — Ambulatory Visit (HOSPITAL_COMMUNITY)
Admission: RE | Admit: 2015-02-16 | Discharge: 2015-02-16 | Disposition: A | Discharge status: Home / Self Care | Payer: Medicare Other | Source: Ambulatory Visit | Attending: Thoracic Surgery (Cardiothoracic Vascular Surgery) | Admitting: Thoracic Surgery (Cardiothoracic Vascular Surgery)

## 2015-02-16 ENCOUNTER — Other Ambulatory Visit (HOSPITAL_COMMUNITY): Payer: BLUE CROSS/BLUE SHIELD

## 2015-02-16 DIAGNOSIS — I350 Nonrheumatic aortic (valve) stenosis: Secondary | ICD-10-CM | POA: Diagnosis not present

## 2015-02-16 MED ORDER — NITROGLYCERIN 0.4 MG SL SUBL
SUBLINGUAL_TABLET | SUBLINGUAL | Status: AC
  Filled 2015-02-16: qty 1

## 2015-02-16 MED ORDER — IOHEXOL 350 MG/ML SOLN
80.0000 mL | Freq: Once | INTRAVENOUS | Status: AC | PRN
  Administered 2015-02-16: 80 mL via INTRAVENOUS

## 2015-02-16 MED ORDER — NITROGLYCERIN 0.4 MG SL SUBL
0.4000 mg | SUBLINGUAL_TABLET | Freq: Once | SUBLINGUAL | Status: DC
  Filled 2015-02-16: qty 25

## 2015-02-16 MED ORDER — METOPROLOL TARTRATE 50 MG PO TABS
ORAL_TABLET | ORAL | Status: AC
Start: 2015-02-16 — End: 2015-02-16
  Filled 2015-02-16: qty 1

## 2015-02-16 MED ORDER — METOPROLOL TARTRATE 50 MG PO TABS
50.0000 mg | ORAL_TABLET | Freq: Once | ORAL | Status: AC
  Administered 2015-02-16: 50 mg via ORAL
  Filled 2015-02-16: qty 1

## 2015-02-19 ENCOUNTER — Ambulatory Visit (INDEPENDENT_AMBULATORY_CARE_PROVIDER_SITE_OTHER): Payer: Medicare Other | Admitting: *Deleted

## 2015-02-19 DIAGNOSIS — Z950 Presence of cardiac pacemaker: Secondary | ICD-10-CM

## 2015-02-19 NOTE — Progress Notes (Signed)
Remote pacemaker transmission.   

## 2015-02-20 LAB — MDC_IDC_ENUM_SESS_TYPE_REMOTE
Battery Voltage: 2.86 V
Brady Statistic AS VP Percent: 0.01 %
Brady Statistic AS VS Percent: 44.42 %
Brady Statistic RA Percent Paced: 55.57 %
Brady Statistic RV Percent Paced: 0.04 %
Date Time Interrogation Session: 20160225154443
Lead Channel Impedance Value: 456 Ohm
Lead Channel Sensing Intrinsic Amplitude: 7.5046
Lead Channel Setting Pacing Amplitude: 2.5 V
MDC IDC MSMT LEADCHNL RA IMPEDANCE VALUE: 528 Ohm
MDC IDC MSMT LEADCHNL RA SENSING INTR AMPL: 2.501
MDC IDC SET LEADCHNL RA PACING AMPLITUDE: 2 V
MDC IDC SET LEADCHNL RV PACING PULSEWIDTH: 0.4 ms
MDC IDC SET LEADCHNL RV SENSING SENSITIVITY: 0.9 mV
MDC IDC STAT BRADY AP VP PERCENT: 0.03 %
MDC IDC STAT BRADY AP VS PERCENT: 55.55 %
Zone Setting Detection Interval: 350 ms
Zone Setting Detection Interval: 400 ms

## 2015-02-23 ENCOUNTER — Ambulatory Visit (HOSPITAL_COMMUNITY)
Admission: RE | Admit: 2015-02-23 | Discharge: 2015-02-23 | Disposition: A | Discharge status: Home / Self Care | Payer: Medicare Other | Source: Ambulatory Visit | Attending: Cardiovascular Disease | Admitting: Cardiovascular Disease

## 2015-02-23 ENCOUNTER — Other Ambulatory Visit (HOSPITAL_COMMUNITY): Payer: Self-pay | Admitting: Cardiology

## 2015-02-23 ENCOUNTER — Ambulatory Visit (INDEPENDENT_AMBULATORY_CARE_PROVIDER_SITE_OTHER): Payer: Medicare Other | Admitting: Pharmacist Clinician (PhC)/ Clinical Pharmacy Specialist

## 2015-02-23 ENCOUNTER — Other Ambulatory Visit: Payer: Self-pay | Admitting: *Deleted

## 2015-02-23 DIAGNOSIS — I6523 Occlusion and stenosis of bilateral carotid arteries: Secondary | ICD-10-CM

## 2015-02-23 DIAGNOSIS — I6529 Occlusion and stenosis of unspecified carotid artery: Secondary | ICD-10-CM

## 2015-02-23 DIAGNOSIS — I779 Disorder of arteries and arterioles, unspecified: Secondary | ICD-10-CM

## 2015-02-23 DIAGNOSIS — Z7901 Long term (current) use of anticoagulants: Secondary | ICD-10-CM

## 2015-02-23 DIAGNOSIS — I359 Nonrheumatic aortic valve disorder, unspecified: Secondary | ICD-10-CM

## 2015-02-23 LAB — POCT INR: INR: 3.6

## 2015-02-23 NOTE — Progress Notes (Signed)
Carotid Duplex Completed. Stable mild plaque in bilateral ICAs with less than 30% stenosis. [name] [name], BS, RDMS, RVT

## 2015-02-27 ENCOUNTER — Encounter: Payer: Self-pay | Admitting: Cardiology

## 2015-03-04 ENCOUNTER — Encounter: Payer: Self-pay | Admitting: Internal Medicine

## 2015-03-23 ENCOUNTER — Ambulatory Visit (INDEPENDENT_AMBULATORY_CARE_PROVIDER_SITE_OTHER): Payer: Medicare Other | Admitting: *Deleted

## 2015-03-23 DIAGNOSIS — I495 Sick sinus syndrome: Secondary | ICD-10-CM | POA: Diagnosis not present

## 2015-03-23 DIAGNOSIS — Z950 Presence of cardiac pacemaker: Secondary | ICD-10-CM | POA: Diagnosis not present

## 2015-03-23 LAB — MDC_IDC_ENUM_SESS_TYPE_REMOTE
Brady Statistic AS VS Percent: 48.39 %
Brady Statistic RA Percent Paced: 51.52 %
Brady Statistic RV Percent Paced: 0.12 %
Lead Channel Impedance Value: 504 Ohm
Lead Channel Sensing Intrinsic Amplitude: 2.5872
Lead Channel Setting Pacing Amplitude: 2.5 V
Lead Channel Setting Sensing Sensitivity: 0.9 mV
MDC IDC MSMT BATTERY VOLTAGE: 2.86 V
MDC IDC MSMT LEADCHNL RA IMPEDANCE VALUE: 528 Ohm
MDC IDC MSMT LEADCHNL RV SENSING INTR AMPL: 5.799
MDC IDC SESS DTM: 20160328130121
MDC IDC SET LEADCHNL RA PACING AMPLITUDE: 2 V
MDC IDC SET LEADCHNL RV PACING PULSEWIDTH: 0.4 ms
MDC IDC SET ZONE DETECTION INTERVAL: 350 ms
MDC IDC STAT BRADY AP VP PERCENT: 0.04 %
MDC IDC STAT BRADY AP VS PERCENT: 51.49 %
MDC IDC STAT BRADY AS VP PERCENT: 0.08 %
Zone Setting Detection Interval: 400 ms

## 2015-03-23 NOTE — Progress Notes (Signed)
Remote pacemaker transmission.   

## 2015-03-27 ENCOUNTER — Ambulatory Visit (INDEPENDENT_AMBULATORY_CARE_PROVIDER_SITE_OTHER): Payer: Medicare Other | Admitting: Pharmacist Clinician (PhC)/ Clinical Pharmacy Specialist

## 2015-03-27 DIAGNOSIS — Z7901 Long term (current) use of anticoagulants: Secondary | ICD-10-CM | POA: Diagnosis not present

## 2015-03-27 DIAGNOSIS — I359 Nonrheumatic aortic valve disorder, unspecified: Secondary | ICD-10-CM | POA: Diagnosis not present

## 2015-03-27 LAB — POCT INR: INR: 3

## 2015-04-01 ENCOUNTER — Encounter: Payer: Self-pay | Admitting: Cardiology

## 2015-04-07 ENCOUNTER — Encounter: Payer: Self-pay | Admitting: Internal Medicine

## 2015-04-24 NOTE — Progress Notes (Signed)
HPI: FU AVR; hx of aortic insufficiency and ascending thoracic aortic aneurysm s/p Bentall procedure with mechanical AVR in 2006, sinus bradycardia status post permanent pacemaker implantation, PAF, SVT, HTN, hypothyroidism.Patient was seen by Dr. Ladona Ridgel 10/15 with recurrent and increasingly symptomatic atrial fibrillation. He has also been noted to have MAT. Amiodarone initiated. Echocardiogram 10/15 demonstrated normal LV function with an EF of 50-55%. AVR was functioning appropriately. Note had EGD 9/15 showing gastritis, duodenitis, hiatal hernia and stricture. Colonoscopy showed extensive diverticular disease and hemorrhoids. 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 Followed by Dr Cornelius Moras; carotid Dopplers February 2016 showed no significant stenosis. Since last seen, he denies dyspnea, chest pain, palpitations, syncope or pedal edema.   Studies: - LHC (1/06): Proximal RCA 30%, large thoracic aortic aneurysm (7 cm by CT scan) - Echo (09/2014): Mild concentric hypertrophy. EF 50% to 55%. Wall motion was normal. Grade 1 diastolic dysfunction. Aortic valve: A mechanical prosthesis was present. There was mildregurgitation. Mean gradient (S): 10 mm Hg. Peak gradient (S): 22 mm Hg.Moderate LAE, mild RAE - Carotid US (2/16): No significant stenosis  Current Outpatient Prescriptions  Medication Sig Dispense Refill  . alfuzosin (UROXATRAL) 10 MG 24 hr tablet Take 10 mg by mouth daily.     [name] amiodarone (PACERONE) 200 MG tablet Take 1 tablet (200 mg total) by mouth daily. 45 tablet 3  . diltiazem (CARDIZEM CD) 300 MG 24 hr capsule Take 1 capsule (300 mg total) by mouth daily. 30 capsule 1  . finasteride (PROSCAR) 5 MG tablet Take 5 mg by mouth daily.    . furosemide (LASIX) 20 MG tablet Take 20 mg by mouth daily. Pt can take 40 mg by mouth daily as needed for weight gain    . Lactobacillus (ACIDOPHILUS PO) Take 1 capsule by mouth 2 (two) times daily.    [name] levothyroxine (SYNTHROID, LEVOTHROID)  75 MCG tablet Take 75 mcg by mouth daily before breakfast.    . omeprazole (PRILOSEC) 20 MG capsule Take 20 mg by mouth daily.    . polyethylene glycol (MIRALAX / GLYCOLAX) packet Take 17 g by mouth daily as needed. Until stooling regularly 30 packet 0  . traMADol (ULTRAM) 50 MG tablet Take 100 mg by mouth 3 (three) times daily.     [name] warfarin (COUMADIN) 4 MG tablet Take 4 mg by mouth daily. EXCEPT WED. TAKE 2 MG     No current facility-administered medications for this visit.     Past Medical History  Diagnosis Date  . Elevated PSA   . Aortic insufficiency   . Thoracic aortic aneurysm   . Carpal tunnel syndrome   . SVT (supraventricular tachycardia)   . Amaurosis fugax   . BPH (benign prostatic hypertrophy)   . Hypothyroidism   . Congestive heart failure (CHF)   . Atrial fibrillation   . Presence of permanent cardiac pacemaker     Medtronic  . Lower GI bleeding ~ 08/2014  . History of blood transfusion ~ 08/2014    related to LGIB  . Anemia   . History of gout   . 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 Insert / replace / remove pacemaker      Medtronic  . Median sternotomy    . Total  knee arthroplasty Right   . Carpal tunnel release Right   . Joint replacement    . Inguinal hernia repair Bilateral   . Mechanical aortic valve replacement  01/19/2005    27 mm Carbomedics  . Ascending aortic aneurysm repair w/ mechanical aortic valve replacement  01/19/2005    27 mm Carboseal Valsalva valve conduit    History   Social History  . Marital Status: Married    Spouse Name: N/A  . Number of Children: 3  . Years of Education: N/A   Occupational History  . retired     Visual merchandiserfarmer   Social History Main Topics  . Smoking  status: Never Smoker   . Smokeless tobacco: Never Used  . Alcohol Use: No  . Drug Use: No  . Sexual Activity: Not on file   Other Topics Concern  . Not on file   Social History Narrative   Lives with his wife who does the cooking and make sure he takes his medicines appropriately.    ROS: severe knee arthralgias but no fevers or chills, productive cough, hemoptysis, dysphasia, odynophagia, melena, hematochezia, dysuria, hematuria, rash, seizure activity, orthopnea, PND, pedal edema, claudication. Remaining systems are negative.  Physical Exam: Well-developed well-nourished in no acute distress.  Skin is warm and dry.  HEENT is normal.  Neck is supple.  Chest is clear to auscultation with normal expansion.  Cardiovascular exam is regular rate and rhythm. 2/6 systolic murmur and 1/6 diastolic murmur. Abdominal exam nontender or distended. No masses palpated. Extremities show no edema. neuro grossly intact  ECG sinus rhythm with first-degree AV block. Left anterior fascicular block. No ST changes.

## 2015-04-28 ENCOUNTER — Ambulatory Visit (INDEPENDENT_AMBULATORY_CARE_PROVIDER_SITE_OTHER): Payer: Medicare Other | Admitting: Cardiology

## 2015-04-28 ENCOUNTER — Encounter: Payer: Self-pay | Admitting: Cardiology

## 2015-04-28 ENCOUNTER — Ambulatory Visit (INDEPENDENT_AMBULATORY_CARE_PROVIDER_SITE_OTHER): Payer: Medicare Other

## 2015-04-28 VITALS — BP 124/62 | HR 66 | Ht 72.0 in | Wt 193.0 lb

## 2015-04-28 DIAGNOSIS — I712 Thoracic aortic aneurysm, without rupture, unspecified: Secondary | ICD-10-CM

## 2015-04-28 DIAGNOSIS — I471 Supraventricular tachycardia: Secondary | ICD-10-CM

## 2015-04-28 DIAGNOSIS — Z7901 Long term (current) use of anticoagulants: Secondary | ICD-10-CM | POA: Diagnosis not present

## 2015-04-28 DIAGNOSIS — I480 Paroxysmal atrial fibrillation: Secondary | ICD-10-CM

## 2015-04-28 DIAGNOSIS — I10 Essential (primary) hypertension: Secondary | ICD-10-CM

## 2015-04-28 DIAGNOSIS — I5032 Chronic diastolic (congestive) heart failure: Secondary | ICD-10-CM

## 2015-04-28 DIAGNOSIS — I359 Nonrheumatic aortic valve disorder, unspecified: Secondary | ICD-10-CM

## 2015-04-28 DIAGNOSIS — I6523 Occlusion and stenosis of bilateral carotid arteries: Secondary | ICD-10-CM | POA: Diagnosis not present

## 2015-04-28 DIAGNOSIS — Z950 Presence of cardiac pacemaker: Secondary | ICD-10-CM

## 2015-04-28 LAB — POCT INR: INR: 4.2

## 2015-04-28 NOTE — Assessment & Plan Note (Signed)
Continue SBE prophylaxis and Coumadin.

## 2015-04-28 NOTE — Assessment & Plan Note (Signed)
Patient remains in sinus rhythm. Continue amiodarone and Coumadin. In 3 months when he returns we will repeat TSH, liver functions and chest x-ray.

## 2015-04-28 NOTE — Assessment & Plan Note (Signed)
Patient is euvolemic on examination. Continue present dose of Lasix. He is weighing daily and will take an additional 20 mg as needed.

## 2015-04-28 NOTE — Assessment & Plan Note (Signed)
Blood pressure controlled. Continue present medications. 

## 2015-04-28 NOTE — Patient Instructions (Signed)
Your physician recommends that you schedule a follow-up appointment in: 3 MONTHS WITH DR CRENSHAW  

## 2015-04-28 NOTE — Assessment & Plan Note (Signed)
Continue amiodarone. 

## 2015-04-28 NOTE — Assessment & Plan Note (Signed)
Followed by electrophysiology. 

## 2015-04-28 NOTE — Assessment & Plan Note (Signed)
Patient is scheduled to see Dr. Cornelius Moraswen in approximately one month and will most likely require follow-up CTA at that time.

## 2015-05-06 ENCOUNTER — Telehealth: Payer: Self-pay | Admitting: Cardiology

## 2015-05-06 NOTE — Telephone Encounter (Signed)
Lurena JoinerRebecca called in inquiring about if the dosage had changed for the pt's Amiodarone and she states that the pt's wife was concerned about pt's recent INR numbers. She says that they have been high and wanted to know if the change in manufactures for his Warfarin could be the cause . Please f/u with her   Thanks

## 2015-05-06 NOTE — Telephone Encounter (Signed)
Staff message sent to Vicki MalletKristan Pharm D for resloution

## 2015-05-06 NOTE — Telephone Encounter (Signed)
Spoke with wife, explained that sometimes switching manufacturers can lead to changes in INR.  Explained how we monitor more closely, [name] has appt in 2 weeks.  Mrs. [name] [name] voiced understanding

## 2015-05-08 ENCOUNTER — Other Ambulatory Visit: Payer: Self-pay

## 2015-05-08 DIAGNOSIS — I480 Paroxysmal atrial fibrillation: Secondary | ICD-10-CM

## 2015-05-08 DIAGNOSIS — I10 Essential (primary) hypertension: Secondary | ICD-10-CM

## 2015-05-08 DIAGNOSIS — R0602 Shortness of breath: Secondary | ICD-10-CM

## 2015-05-08 DIAGNOSIS — I712 Thoracic aortic aneurysm, without rupture, unspecified: Secondary | ICD-10-CM

## 2015-05-08 MED ORDER — AMIODARONE HCL 200 MG PO TABS: 200.0000 mg | ORAL_TABLET | Freq: Every day | ORAL | Status: DC

## 2015-05-13 ENCOUNTER — Ambulatory Visit (INDEPENDENT_AMBULATORY_CARE_PROVIDER_SITE_OTHER): Payer: Medicare Other | Admitting: Pharmacist Clinician (PhC)/ Clinical Pharmacy Specialist

## 2015-05-13 DIAGNOSIS — Z7901 Long term (current) use of anticoagulants: Secondary | ICD-10-CM | POA: Diagnosis not present

## 2015-05-13 LAB — POCT INR: INR: 3.3

## 2015-05-18 ENCOUNTER — Ambulatory Visit (INDEPENDENT_AMBULATORY_CARE_PROVIDER_SITE_OTHER): Payer: Medicare Other | Admitting: Thoracic Surgery (Cardiothoracic Vascular Surgery)

## 2015-05-18 ENCOUNTER — Encounter: Payer: Self-pay | Admitting: Thoracic Surgery (Cardiothoracic Vascular Surgery)

## 2015-05-18 VITALS — BP 99/57 | HR 98 | Resp 20 | Ht 72.0 in

## 2015-05-18 DIAGNOSIS — I6523 Occlusion and stenosis of bilateral carotid arteries: Secondary | ICD-10-CM | POA: Diagnosis not present

## 2015-05-18 DIAGNOSIS — I7121 Aneurysm of the ascending aorta, without rupture: Secondary | ICD-10-CM

## 2015-05-18 DIAGNOSIS — I712 Thoracic aortic aneurysm, without rupture, unspecified: Secondary | ICD-10-CM

## 2015-05-18 DIAGNOSIS — Z954 Presence of other heart-valve replacement: Secondary | ICD-10-CM | POA: Diagnosis not present

## 2015-05-18 DIAGNOSIS — I719 Aortic aneurysm of unspecified site, without rupture: Secondary | ICD-10-CM | POA: Diagnosis not present

## 2015-05-18 DIAGNOSIS — Z952 Presence of prosthetic heart valve: Secondary | ICD-10-CM

## 2015-05-18 NOTE — Progress Notes (Signed)
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 He was last seen here in our office on 12/09/2014.  The patient has numerous other chronic morbid medical conditions including chronic diastolic congestive heart failure, atrial fibrillation, hypertension, and severe degenerative arthritis with limited physical mobility and severe physical deconditioning.  The patient reports no new problems or complaints over the last few months, although he continues to struggle with severely limited physical mobility and severe exertional shortness of breath. He has not had any chest pain or chest tightness either with activity or at rest. He states that his blood pressure has been under good control, and in fact the patient takes it may be running lower than it should be. However, he has not had dizzy spells or syncope.   Current Outpatient Prescriptions  Medication Sig Dispense Refill  . alfuzosin (UROXATRAL) 10 MG 24 hr tablet Take 10 mg by mouth daily.     [name] amiodarone (PACERONE) 200 MG tablet Take 1 tablet (200 mg total) by mouth daily. 30 tablet 3  . diltiazem (CARDIZEM CD) 300 MG 24 hr capsule Take 1 capsule (300 mg total) by mouth daily. 30 capsule 1  . finasteride (PROSCAR) 5 MG tablet Take 5 mg by mouth daily.    . furosemide (LASIX) 20 MG tablet Take 20 mg by mouth daily. Pt can take 40 mg by mouth daily as needed for weight gain    . Lactobacillus (ACIDOPHILUS PO) Take 1 capsule by mouth 2 (two) times  daily.    [name] levothyroxine (SYNTHROID, LEVOTHROID) 100 MCG tablet Take 1 tablet by mouth daily.    [name] omeprazole (PRILOSEC) 20 MG capsule Take 20 mg by mouth daily.    . polyethylene glycol (MIRALAX / GLYCOLAX) packet Take 17 g by mouth daily as needed. Until stooling regularly 30 packet 0  . traMADol (ULTRAM) 50 MG tablet Take 100 mg by mouth 3 (three) times daily.     [name] warfarin (COUMADIN) 4 MG tablet Take 4 mg by mouth daily. EXCEPT WED. TAKE 2 MG     No current facility-administered medications for this visit.      Physical Exam:   BP 99/57 mmHg  Pulse 98  Resp 20  Ht 6' (1.829 m)  SpO2 96%  General:  Obese and chronically ill-appearing  Chest:   Clear to auscultation  CV:   Irregular rate and rhythm  Incisions:  n/a  Abdomen:  Soft and nontender  Extremities:  Warm and well-perfused with mild lower extremity edema  Diagnostic Tests:  CT ANGIOGRAPHY OF THE HEART, CORONARY ARTERY, STRUCTURE, AND MORPHOLOGY  PROCEDURE: CT angiography of the coronary vessels was performed on a 256 channel system using prospective ECG gating. A scout and ECG-gated noncontrast exam (for calcium scoring) were performed. Appropriate delay was determined by bolus tracking after injection of iodinated contrast, and an ECG-gated coronary CTA was performed with sub-mm slice collimation during late diastole. Imaging post processing was  performed on an independent workstation creating multiplanar and 3-D images, allowing for quantitative analysis of the heart and coronary arteries. Note that this exam targets the heart and the chest was not imaged in its entirety.  COMPARISON: Chest CTA 11/28/2014.  MEDICATIONS: Lopressor: 50 mg, P.O.  FINDINGS: Technical quality: Excellent.  Heart rate: 61  CORONARY ARTERIES:  Coronary arteries were incompletely imaged, and are largely not interpretable secondary to a combination of extensive coronary artery calcification and cardiac motion.  Multivessel coronary artery disease is noted, most severe in the left anterior descending coronary artery.  CORONARY CALCIUM:  Calcium scoring was not performed.  OTHER FINDINGS:  Mediastinum/Lymph Nodes: Post procedural changes of Bentall procedure noted, with a mechanical aortic valve (Saint Jude type valve), and an ascending aortic graft noted. Along the right side of the distal graft, originating at or immediately below the level of the sewing ring, there is a small focal outpouching of contrast, which extends over a length of approximately 2.3 cm craniocaudally and measures approximately 1.8 x 0.9 cm transaxially. The neck of this apparent pseudoaneurysm is visualized on image 178 of series 501. No extension of the pseudoaneurysm above the sewing ring is identified. The proximal aortic arch is mildly aneurysmal measuring 4.4 cm in diameter. The mid arch, isthmus and descending thoracic aorta measure 3.2 cm, 3.4 cm and 2.9 cm in diameter respectively.  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 Mild kinking of the ascending thoracic aortic graft, of no hemodynamic significance. Study was focused on the area of concern in the ascending thoracic aorta, so the entire thorax was not covered. Within the visualized portions of the thorax there are several borderline dilated and mildly dilated mediastinal lymph nodes, measuring up to 11 mm in short axis, likely chronic in reactive. Esophagus is unremarkable in appearance. No axillary lymphadenopathy. Left-sided pacemaker in position with lead tips terminating in the right atrial appendage and right ventricular apex.  Lungs/Pleura: Mild diffuse bronchial wall thickening. No acute consolidative airspace disease. No pleural effusions. Mild scarring in the lung bases, particularly in the inferior segment of the lingula. No suspicious appearing pulmonary nodules or masses are noted in the visualized  portions of the lungs.  Musculoskeletal/Soft Tissues: Median sternotomy wires. There are no aggressive appearing lytic or blastic lesions noted in the visualized portions of the skeleton.  IMPRESSION: 1. Small pseudoaneurysm off the right side of the distal ascending thoracic aorta, which appears to originate at or immediately beneath the level of the sewing ring for the patient's ascending thoracic aortic graft. This measures approximately 2.3 x 1.8 x 0.9 cm on today's examination, and appears only slightly larger than prior study from 11/28/2014. No extension distal to the sewing ring into the upper ascending thoracic aorta or aortic arch. 2. 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 3. Additional incidental findings, as above. These results were called by telephone at the time of interpretation on 02/16/2015 at 12:03 pm to Dr. Tressie StalkerLARENCE Cherity Blickenstaff, who verbally acknowledged these results.   Electronically Signed  By: Trudie Reedaniel Entrikin M.D.  On: 02/16/2015 12:03   Impression:  Patient has mild aneurysmal enlargement of the transverse aortic arch with a small false aneurysm located at the distal anastomosis between the patient's synthetic aortic graft and the ascending thoracic aorta. Cardiac gated CT angiogram confirms the presence of a small false aneurysm that remains stable in size in comparison with previous CT angiogram performed in early December 2015.  The proximal aortic arch also appears stable and measures 4.4 cm in its greatest diameter.   Plan:  We will plan follow-up CT angiogram of the chest in 3 months. At this point I think it would be reasonable to continue to follow this small false aneurysm with non-gated CT imaging. I remain hopeful that the small false aneurysm will remain stable. This patient would not be considered candidate for open surgery. Possible endovascular treatment could be considered if the false aneurysm  shows signs of worrisome enlargement.  I spent in excess of 15 minutes during the conduct of this office consultation and >50% of this time involved direct face-to-face encounter with the patient for counseling and/or coordination of their care.    Salvatore Decent. Cornelius Moras, MD 05/18/2015 3:37 PM

## 2015-06-10 ENCOUNTER — Ambulatory Visit (INDEPENDENT_AMBULATORY_CARE_PROVIDER_SITE_OTHER): Payer: Medicare Other | Admitting: Pharmacist Clinician (PhC)/ Clinical Pharmacy Specialist

## 2015-06-10 DIAGNOSIS — Z7901 Long term (current) use of anticoagulants: Secondary | ICD-10-CM | POA: Diagnosis not present

## 2015-06-10 DIAGNOSIS — I359 Nonrheumatic aortic valve disorder, unspecified: Secondary | ICD-10-CM

## 2015-06-10 LAB — POCT INR: INR: 3.2

## 2015-06-22 ENCOUNTER — Ambulatory Visit (INDEPENDENT_AMBULATORY_CARE_PROVIDER_SITE_OTHER): Payer: Medicare Other | Admitting: *Deleted

## 2015-06-22 ENCOUNTER — Encounter: Payer: Self-pay | Admitting: Internal Medicine

## 2015-06-22 DIAGNOSIS — I495 Sick sinus syndrome: Secondary | ICD-10-CM | POA: Diagnosis not present

## 2015-06-22 NOTE — Progress Notes (Signed)
Remote pacemaker transmission.   

## 2015-06-25 LAB — CUP PACEART REMOTE DEVICE CHECK
Brady Statistic AP VP Percent: 0.02 %
Brady Statistic AP VS Percent: 55.95 %
Brady Statistic RV Percent Paced: 0.07 %
Lead Channel Impedance Value: 480 Ohm
Lead Channel Sensing Intrinsic Amplitude: 7.164 mV
Lead Channel Setting Pacing Amplitude: 2 V
Lead Channel Setting Pacing Amplitude: 2.5 V
Lead Channel Setting Sensing Sensitivity: 0.9 mV
MDC IDC MSMT BATTERY VOLTAGE: 2.83 V
MDC IDC MSMT LEADCHNL RA IMPEDANCE VALUE: 512 Ohm
MDC IDC MSMT LEADCHNL RA SENSING INTR AMPL: 2.285 mV
MDC IDC SESS DTM: 20160627131932
MDC IDC SET LEADCHNL RV PACING PULSEWIDTH: 0.4 ms
MDC IDC SET ZONE DETECTION INTERVAL: 400 ms
MDC IDC STAT BRADY AS VP PERCENT: 0.04 %
MDC IDC STAT BRADY AS VS PERCENT: 43.98 %
MDC IDC STAT BRADY RA PERCENT PACED: 55.98 %
Zone Setting Detection Interval: 350 ms

## 2015-07-08 ENCOUNTER — Ambulatory Visit (INDEPENDENT_AMBULATORY_CARE_PROVIDER_SITE_OTHER): Payer: Medicare Other | Admitting: Pharmacist Clinician (PhC)/ Clinical Pharmacy Specialist

## 2015-07-08 DIAGNOSIS — I359 Nonrheumatic aortic valve disorder, unspecified: Secondary | ICD-10-CM | POA: Diagnosis not present

## 2015-07-08 DIAGNOSIS — Z7901 Long term (current) use of anticoagulants: Secondary | ICD-10-CM | POA: Diagnosis not present

## 2015-07-08 LAB — POCT INR: INR: 2.6

## 2015-07-22 ENCOUNTER — Encounter: Payer: Self-pay | Admitting: *Deleted

## 2015-07-27 ENCOUNTER — Ambulatory Visit (INDEPENDENT_AMBULATORY_CARE_PROVIDER_SITE_OTHER): Payer: Medicare Other | Admitting: *Deleted

## 2015-07-27 DIAGNOSIS — Z950 Presence of cardiac pacemaker: Secondary | ICD-10-CM | POA: Diagnosis not present

## 2015-07-28 NOTE — Progress Notes (Signed)
HPI: FU AVR; hx of aortic insufficiency and ascending thoracic aortic aneurysm s/p Bentall procedure with mechanical AVR in 2006, sinus bradycardia status post permanent pacemaker implantation, PAF, SVT.Patient was seen by Dr. Ladona Ridgel 10/15 with recurrent and increasingly symptomatic atrial fibrillation. He has also been noted to have MAT. Amiodarone initiated. Echocardiogram 10/15 demonstrated normal LV function with an EF of 50-55%. AVR was functioning appropriately. Note had EGD 9/15 showing gastritis, duodenitis, hiatal hernia and stricture. Colonoscopy showed extensive diverticular disease and hemorrhoids. 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 Followed by Dr Cornelius Moras. Since last seen, he complains of weakness and occasional dizziness. He has mild dyspnea on exertion. Limited activities. No orthopnea, PND or pedal edema.   Studies: - LHC (1/06): Proximal RCA 30%, large thoracic aortic aneurysm (7 cm by CT scan) - Echo (09/2014): Mild concentric hypertrophy. EF 50% to 55%. Wall motion was normal. Grade 1 diastolic dysfunction. Aortic valve: A mechanical prosthesis was present. There was mildregurgitation. Mean gradient (S): 10 mm Hg. Peak gradient (S): 22 mm Hg.Moderate LAE, mild RAE - Carotid US (2/16): No significant stenosis  Current Outpatient Prescriptions  Medication Sig Dispense Refill  . alfuzosin (UROXATRAL) 10 MG 24 hr tablet Take 10 mg by mouth daily.     [name] amiodarone (PACERONE) 200 MG tablet Take 1 tablet (200 mg total) by mouth daily. 30 tablet 3  . diltiazem (CARDIZEM CD) 300 MG 24 hr capsule Take 1 capsule (300 mg total) by mouth daily. 30 capsule 1  . finasteride (PROSCAR) 5 MG tablet Take 5 mg by mouth daily.    . furosemide (LASIX) 20 MG tablet Take 20 mg by mouth daily. Pt can take 40 mg by mouth daily as needed for weight gain    . Lactobacillus (ACIDOPHILUS PO) Take 1 capsule by mouth 2 (two) times daily.    [name] levothyroxine (SYNTHROID, LEVOTHROID) 100 MCG tablet  Take 1 tablet by mouth daily.    [name] omeprazole (PRILOSEC) 20 MG capsule Take 20 mg by mouth daily.    . polyethylene glycol (MIRALAX / GLYCOLAX) packet Take 17 g by mouth daily as needed. Until stooling regularly 30 packet 0  . traMADol (ULTRAM) 50 MG tablet Take 100 mg by mouth 3 (three) times daily.     [name] warfarin (COUMADIN) 4 MG tablet Take 4 mg by mouth daily. EXCEPT WED. TAKE 2 MG     No current facility-administered medications for this visit.     Past Medical History  Diagnosis Date  . Elevated PSA   . Aortic insufficiency   . Thoracic aortic aneurysm   . Carpal tunnel syndrome   . SVT (supraventricular tachycardia)   . Amaurosis fugax   . BPH (benign prostatic hypertrophy)   . Hypothyroidism   . Congestive heart failure (CHF)   . Atrial fibrillation   . Presence of permanent cardiac pacemaker     Medtronic  . Lower GI bleeding ~ 08/2014  . History of blood transfusion ~ 08/2014    related to LGIB  . Anemia   . History of gout   . 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 Pseudoaneurysm of aorta 11/28/2014    Small pseudoaneurysm located at distal anastomosis of prosthetic aortic graft and ascending thoracic aorta    Past Surgical History  Procedure Laterality Date  .  Insert / replace / remove pacemaker      Medtronic  . Median sternotomy    . Total knee arthroplasty Right   . Carpal tunnel release Right   . Joint replacement    . Inguinal hernia repair Bilateral   . Mechanical aortic valve replacement  01/19/2005    27 mm Carbomedics  . Ascending aortic aneurysm repair w/ mechanical aortic valve replacement  01/19/2005    27 mm Carboseal Valsalva valve conduit    History   Social History  . Marital Status: Married    Spouse Name: N/A  . Number of Children: 3  .  Years of Education: N/A   Occupational History  . retired     Visual merchandiser   Social History Main Topics  . Smoking status: Never Smoker   . Smokeless tobacco: Never Used  . Alcohol Use: No  . Drug Use: No  . Sexual Activity: Not on file   Other Topics Concern  . Not on file   Social History Narrative   Lives with his wife who does the cooking and make sure he takes his medicines appropriately.    ROS: knee pain but no fevers or chills, productive cough, hemoptysis, dysphasia, odynophagia, melena, hematochezia, dysuria, hematuria, rash, seizure activity, orthopnea, PND, pedal edema, claudication. Remaining systems are negative.  Physical Exam: Well-developed well-nourished in no acute distress.  Skin is warm and dry.  HEENT is normal.  Neck is supple.  Chest is clear to auscultation with normal expansion.  Cardiovascular exam is regular rate and rhythm. Crisp mechanical valve sounds. Abdominal exam nontender or distended. No masses palpated. Extremities show no edema. neuro grossly intact

## 2015-07-29 NOTE — Progress Notes (Signed)
Remote pacemaker transmission.   

## 2015-07-30 ENCOUNTER — Encounter: Payer: Self-pay | Admitting: Cardiology

## 2015-07-30 ENCOUNTER — Ambulatory Visit (INDEPENDENT_AMBULATORY_CARE_PROVIDER_SITE_OTHER): Payer: Medicare Other | Admitting: Cardiology

## 2015-07-30 ENCOUNTER — Other Ambulatory Visit (INDEPENDENT_AMBULATORY_CARE_PROVIDER_SITE_OTHER): Payer: Medicare Other

## 2015-07-30 ENCOUNTER — Ambulatory Visit (INDEPENDENT_AMBULATORY_CARE_PROVIDER_SITE_OTHER): Payer: Medicare Other | Admitting: Pharmacist Clinician (PhC)/ Clinical Pharmacy Specialist

## 2015-07-30 ENCOUNTER — Ambulatory Visit (INDEPENDENT_AMBULATORY_CARE_PROVIDER_SITE_OTHER)
Admission: RE | Admit: 2015-07-30 | Discharge: 2015-07-30 | Disposition: A | Discharge status: Home / Self Care | Payer: Medicare Other | Source: Ambulatory Visit | Attending: Cardiology | Admitting: Cardiology

## 2015-07-30 VITALS — BP 110/54 | HR 76 | Ht 72.0 in | Wt 198.0 lb

## 2015-07-30 DIAGNOSIS — I6523 Occlusion and stenosis of bilateral carotid arteries: Secondary | ICD-10-CM

## 2015-07-30 DIAGNOSIS — I4891 Unspecified atrial fibrillation: Secondary | ICD-10-CM

## 2015-07-30 DIAGNOSIS — I359 Nonrheumatic aortic valve disorder, unspecified: Secondary | ICD-10-CM | POA: Diagnosis not present

## 2015-07-30 DIAGNOSIS — I480 Paroxysmal atrial fibrillation: Secondary | ICD-10-CM

## 2015-07-30 DIAGNOSIS — Z7901 Long term (current) use of anticoagulants: Secondary | ICD-10-CM | POA: Diagnosis not present

## 2015-07-30 LAB — BASIC METABOLIC PANEL
BUN: 32 mg/dL — ABNORMAL HIGH (ref 6–23)
CO2: 26 mEq/L (ref 19–32)
Calcium: 8.8 mg/dL (ref 8.4–10.5)
Chloride: 103 mEq/L (ref 96–112)
Creatinine, Ser: 1.56 mg/dL — ABNORMAL HIGH (ref 0.40–1.50)
GFR: 45.2 mL/min — AB (ref >60.00)
Glucose, Bld: 118 mg/dL — ABNORMAL HIGH (ref 70–99)
Potassium: 4.1 mEq/L (ref 3.5–5.1)
SODIUM: 139 meq/L (ref 135–145)

## 2015-07-30 LAB — CBC
HCT: 44.2 % (ref 39.0–52.0)
Hemoglobin: 14.6 g/dL (ref 13.0–17.0)
MCHC: 33.1 g/dL (ref 30.0–36.0)
MCV: 87.4 fl (ref 78.0–100.0)
Platelets: 168 10*3/uL (ref 150.0–400.0)
RBC: 5.05 Mil/uL (ref 4.22–5.81)
RDW: 20 % — ABNORMAL HIGH (ref 11.5–15.5)
WBC: 9.5 10*3/uL (ref 4.0–10.5)

## 2015-07-30 LAB — HEPATIC FUNCTION PANEL
ALK PHOS: 77 U/L (ref 39–117)
ALT: 21 U/L (ref 0–53)
AST: 26 U/L (ref 0–37)
Albumin: 3.5 g/dL (ref 3.5–5.2)
Bilirubin, Direct: 0.2 mg/dL (ref 0.0–0.3)
Total Bilirubin: 1 mg/dL (ref 0.2–1.2)
Total Protein: 6 g/dL (ref 6.0–8.3)

## 2015-07-30 LAB — POCT INR: INR: 3.8

## 2015-07-30 LAB — TSH: TSH: 2.57 u[IU]/mL (ref 0.35–4.50)

## 2015-07-30 MED ORDER — FUROSEMIDE 20 MG PO TABS: ORAL_TABLET | ORAL | Status: DC

## 2015-07-30 MED ORDER — DILTIAZEM HCL ER COATED BEADS 180 MG PO CP24: 180.0000 mg | ORAL_CAPSULE | Freq: Every day | ORAL | Status: DC

## 2015-07-30 NOTE — Assessment & Plan Note (Signed)
Patient will have a follow-up CTA with Dr. Cornelius Moras in the near future.

## 2015-07-30 NOTE — Assessment & Plan Note (Signed)
Patient appears to be euvolemic on examination. He is describing occasional dizziness with standing. I will decrease his Lasix to 20 mg every other day and he will follow his weight and symptoms at home. If he develops increasing weight or dyspnea he will resume 20 mg every day. Check potassium and renal function.

## 2015-07-30 NOTE — Patient Instructions (Signed)
Your physician wants you to follow-up in: 3 MONTHS WITH DR Jens Som You will receive a reminder letter in the mail two months in advance. If you don't receive a letter, please call our office to schedule the follow-up appointment.   DECREASE FUROSEMIDE TO 20 MG EVERY OTHER DAY  DECREASE DILTIAZEM TO 180 MG ONCE DAILY  Your physician recommends that you HAVE LAB WORK TODAY= [address]=GO TO THE BASEMENT  A chest x-ray takes a picture of the organs and structures inside the chest, including the heart, lungs, and blood vessels. This test can show several things, including, whether the heart is enlarges; whether fluid is building up in the lungs; and whether pacemaker / defibrillator leads are still in place. [address]=GO TO THE BASEMENT

## 2015-07-30 NOTE — Assessment & Plan Note (Signed)
Continue SBE prophylaxis.continue Coumadin. Check hemoglobin.

## 2015-07-30 NOTE — Assessment & Plan Note (Signed)
Continue amiodarone. 

## 2015-07-30 NOTE — Assessment & Plan Note (Signed)
Continue amiodarone.continue Coumadin. Check TSH, liver functions and chest x-ray. Decrease Cardizem to 180 mg daily given dizziness.

## 2015-07-30 NOTE — Assessment & Plan Note (Signed)
Blood pressure is borderline. He is complaining of dizziness. Decrease Cardizem to 180 mg daily and follow.

## 2015-07-30 NOTE — Assessment & Plan Note (Signed)
Followed by electrophysiology. 

## 2015-07-31 LAB — CUP PACEART REMOTE DEVICE CHECK
Battery Voltage: 2.82 V
Brady Statistic AP VS Percent: 53.81 %
Brady Statistic RV Percent Paced: 0.05 %
Date Time Interrogation Session: 20160801122110
Lead Channel Sensing Intrinsic Amplitude: 2.717 mV
Lead Channel Sensing Intrinsic Amplitude: 4.776 mV
Lead Channel Setting Pacing Amplitude: 2.5 V
Lead Channel Setting Pacing Pulse Width: 0.4 ms
MDC IDC MSMT LEADCHNL RA IMPEDANCE VALUE: 528 Ohm
MDC IDC MSMT LEADCHNL RV IMPEDANCE VALUE: 528 Ohm
MDC IDC SET LEADCHNL RA PACING AMPLITUDE: 2 V
MDC IDC SET LEADCHNL RV SENSING SENSITIVITY: 0.9 mV
MDC IDC SET ZONE DETECTION INTERVAL: 350 ms
MDC IDC STAT BRADY AP VP PERCENT: 0.03 %
MDC IDC STAT BRADY AS VP PERCENT: 0.02 %
MDC IDC STAT BRADY AS VS PERCENT: 46.15 %
MDC IDC STAT BRADY RA PERCENT PACED: 53.83 %
Zone Setting Detection Interval: 400 ms

## 2015-08-13 ENCOUNTER — Ambulatory Visit (INDEPENDENT_AMBULATORY_CARE_PROVIDER_SITE_OTHER): Payer: Medicare Other | Admitting: Pharmacist Clinician (PhC)/ Clinical Pharmacy Specialist

## 2015-08-13 DIAGNOSIS — Z7901 Long term (current) use of anticoagulants: Secondary | ICD-10-CM

## 2015-08-13 DIAGNOSIS — I359 Nonrheumatic aortic valve disorder, unspecified: Secondary | ICD-10-CM

## 2015-08-13 LAB — POCT INR: INR: 3.8

## 2015-08-27 ENCOUNTER — Encounter: Payer: Self-pay | Admitting: Cardiology

## 2015-08-28 ENCOUNTER — Ambulatory Visit (INDEPENDENT_AMBULATORY_CARE_PROVIDER_SITE_OTHER): Payer: Medicare Other | Admitting: Pharmacist Clinician (PhC)/ Clinical Pharmacy Specialist

## 2015-08-28 DIAGNOSIS — Z7901 Long term (current) use of anticoagulants: Secondary | ICD-10-CM | POA: Diagnosis not present

## 2015-08-28 DIAGNOSIS — I359 Nonrheumatic aortic valve disorder, unspecified: Secondary | ICD-10-CM | POA: Diagnosis not present

## 2015-08-28 LAB — POCT INR: INR: 2.4

## 2015-08-28 NOTE — Patient Instructions (Signed)
Try meclizine for dizziness.  It is OTC under the names Bonine or Dramamine II

## 2015-09-01 ENCOUNTER — Ambulatory Visit (INDEPENDENT_AMBULATORY_CARE_PROVIDER_SITE_OTHER): Payer: Medicare Other | Admitting: *Deleted

## 2015-09-01 DIAGNOSIS — I495 Sick sinus syndrome: Secondary | ICD-10-CM | POA: Diagnosis not present

## 2015-09-03 ENCOUNTER — Other Ambulatory Visit: Payer: Self-pay | Admitting: Physician Assistant

## 2015-09-04 NOTE — Progress Notes (Signed)
Remote pacemaker transmission.   

## 2015-09-07 ENCOUNTER — Encounter: Payer: Self-pay | Admitting: Internal Medicine

## 2015-09-10 LAB — CUP PACEART REMOTE DEVICE CHECK
Battery Voltage: 2.82 V
Brady Statistic AP VP Percent: 0.1 % — CL
Brady Statistic AP VS Percent: 51.7 %
Brady Statistic AS VS Percent: 48.3 %
Date Time Interrogation Session: 20160915124531
Lead Channel Impedance Value: 520 Ohm
Lead Channel Sensing Intrinsic Amplitude: 2.2 mV
Lead Channel Sensing Intrinsic Amplitude: 4.8 mV
Lead Channel Setting Pacing Amplitude: 2 V
Lead Channel Setting Pacing Amplitude: 2.5 V
Lead Channel Setting Sensing Sensitivity: 0.9 mV
MDC IDC MSMT LEADCHNL RV IMPEDANCE VALUE: 488 Ohm
MDC IDC SET LEADCHNL RV PACING PULSEWIDTH: 0.4 ms
MDC IDC STAT BRADY AS VP PERCENT: 0.1 % — AB
Zone Setting Detection Interval: 350 ms
Zone Setting Detection Interval: 400 ms

## 2015-09-11 ENCOUNTER — Ambulatory Visit (INDEPENDENT_AMBULATORY_CARE_PROVIDER_SITE_OTHER): Payer: Medicare Other | Admitting: Pharmacist Clinician (PhC)/ Clinical Pharmacy Specialist

## 2015-09-11 DIAGNOSIS — I359 Nonrheumatic aortic valve disorder, unspecified: Secondary | ICD-10-CM

## 2015-09-11 DIAGNOSIS — Z7901 Long term (current) use of anticoagulants: Secondary | ICD-10-CM

## 2015-09-11 LAB — POCT INR: INR: 2.4

## 2015-09-25 ENCOUNTER — Encounter: Payer: Self-pay | Admitting: Cardiology

## 2015-10-01 ENCOUNTER — Ambulatory Visit (INDEPENDENT_AMBULATORY_CARE_PROVIDER_SITE_OTHER): Payer: Medicare Other | Admitting: *Deleted

## 2015-10-01 DIAGNOSIS — I495 Sick sinus syndrome: Secondary | ICD-10-CM

## 2015-10-01 NOTE — Progress Notes (Signed)
Remote pacemaker transmission.   

## 2015-10-07 ENCOUNTER — Ambulatory Visit (INDEPENDENT_AMBULATORY_CARE_PROVIDER_SITE_OTHER): Payer: Medicare Other | Admitting: Pharmacist

## 2015-10-07 ENCOUNTER — Encounter: Payer: Self-pay | Admitting: Internal Medicine

## 2015-10-07 DIAGNOSIS — I359 Nonrheumatic aortic valve disorder, unspecified: Secondary | ICD-10-CM | POA: Diagnosis not present

## 2015-10-07 DIAGNOSIS — Z7901 Long term (current) use of anticoagulants: Secondary | ICD-10-CM | POA: Diagnosis not present

## 2015-10-07 LAB — POCT INR: INR: 3.8

## 2015-10-08 NOTE — Patient Outreach (Signed)
[facility]) Care Management  10/08/2015  [name] [name] 04/15/1928 [id number]   Referral from [employer] Tier 2 List, assigned [name], RN to outreach for [facility] services.  Thanks, [name], [name] [facility] CM Assistant Phone: [phone] Fax: [phone]

## 2015-10-12 ENCOUNTER — Other Ambulatory Visit: Payer: Self-pay | Admitting: *Deleted

## 2015-10-12 NOTE — Patient Outreach (Signed)
[facility]) Care Management  10/12/2015  [name] 06/28/1928 [id number]   RN CM attempted  Follow up outreach call to Tier 2  patient.  Patient was unavailable. HIPPA compliance voicemail message was left with return callback number.    [name] MaidensFrances Rohail Klees BSN RN [facility] Care Management [phone]

## 2015-10-16 ENCOUNTER — Encounter: Payer: Self-pay | Admitting: *Deleted

## 2015-10-16 ENCOUNTER — Other Ambulatory Visit: Payer: Self-pay | Admitting: *Deleted

## 2015-10-16 NOTE — Patient Outreach (Signed)
[facility]) Care Management  10/16/2015  [name] [name] 04/23/1928 [id number]   RN Health Coach telephone to patient. Hipaa compliance verified. RN Health Coach spoke with patient and described the services available through [facility]. Patient declined service. RN Health Coach will send an outreach packet to patient.  [name] [name] BSN RN [facility] Care Management [phone]

## 2015-10-19 ENCOUNTER — Telehealth: Payer: Self-pay | Admitting: *Deleted

## 2015-10-19 LAB — CUP PACEART REMOTE DEVICE CHECK
Brady Statistic AP VP Percent: 0.13 %
Brady Statistic AP VS Percent: 15.31 %
Brady Statistic AS VP Percent: 35.34 %
Brady Statistic RA Percent Paced: 15.44 %
Brady Statistic RV Percent Paced: 35.46 %
Date Time Interrogation Session: 20161006124457
Implantable Lead Implant Date: 20060220
Implantable Lead Location: 753859
Implantable Lead Location: 753860
Implantable Lead Model: 5076
Implantable Lead Model: 5076
Lead Channel Impedance Value: 512 Ohm
Lead Channel Impedance Value: 528 Ohm
Lead Channel Sensing Intrinsic Amplitude: 5.117 mV
Lead Channel Setting Pacing Amplitude: 2.5 V
Lead Channel Setting Sensing Sensitivity: 0.9 mV
MDC IDC LEAD IMPLANT DT: 20060220
MDC IDC MSMT BATTERY VOLTAGE: 2.81 V
MDC IDC MSMT LEADCHNL RA SENSING INTR AMPL: 1.595 mV
MDC IDC SET LEADCHNL RV PACING PULSEWIDTH: 0.4 ms
MDC IDC STAT BRADY AS VS PERCENT: 49.23 %

## 2015-10-19 NOTE — Telephone Encounter (Signed)
Informed patient that ERI reached. Patient states that he has felt fine despite reverting to VVI 65. Will have pt to f/u w/Dr.Taylor/Amber Glory BuffSeiler, NP first available to discuss gen change. [facility].

## 2015-10-20 NOTE — Patient Outreach (Signed)
[facility]) Care Management  10/20/2015  [name] [name] 10/09/1928 [id number]   Notification from [name], RN to close case due to patient refused [facility] services.  Thanks, [name], [name] [name] Phone: [phone] Fax: [phone]

## 2015-10-27 NOTE — Progress Notes (Signed)
HPI: FU AVR; hx of aortic insufficiency and ascending thoracic aortic aneurysm s/p Bentall procedure with mechanical AVR in 2006, sinus bradycardia status post permanent pacemaker implantation, PAF, SVT.Patient was seen by Dr. Ladona Ridgelaylor 10/15 with recurrent and increasingly symptomatic atrial fibrillation. He has also been noted to have MAT. Amiodarone initiated. Echocardiogram 10/15 demonstrated normal LV function with an EF of 50-55%. AVR was functioning appropriately. Note had EGD 9/15 showing gastritis, duodenitis, hiatal hernia and stricture. Colonoscopy showed extensive diverticular disease and hemorrhoids. 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 Followed by Dr Cornelius Moraswen. Since last seen, Patient does have some dyspnea on exertion but no orthopnea, PND, pedal edema, chest pain or syncope. He describes fatigue and weakness. Dizziness with sitting up.   Studies: - LHC (1/06): Proximal RCA 30%, large thoracic aortic aneurysm (7 cm by CT scan) - Echo (09/2014): Mild concentric hypertrophy. EF 50% to 55%. Wall motion was normal. Grade 1 diastolic dysfunction. Aortic valve: A mechanical prosthesis was present. There was mildregurgitation. Mean gradient (S): 10 mm Hg. Peak gradient (S): 22 mm Hg.Moderate LAE, mild RAE - Carotid US (2/16): No significant stenosis  Current Outpatient Prescriptions  Medication Sig Dispense Refill  . alfuzosin (UROXATRAL) 10 MG 24 hr tablet Take 10 mg by mouth daily.     [name]. amiodarone (PACERONE) 200 MG tablet Take 1 tablet (200 mg total) by mouth daily. 30 tablet 3  . amiodarone (PACERONE) 200 MG tablet Take 1 tablet (200 mg total) by mouth daily. 30 tablet 1  . diltiazem (CARDIZEM CD) 180 MG 24 hr capsule Take 1 capsule (180 mg total) by mouth daily. 90 capsule 3  . finasteride (PROSCAR) 5 MG tablet Take 5 mg by mouth daily.    . furosemide (LASIX) 20 MG tablet TAKE ONE TABLET EVERY OTHER DAY 45 tablet 3  . furosemide (LASIX) 40 MG tablet Take 1 tablet by mouth  daily as needed.    . Lactobacillus (ACIDOPHILUS PO) Take 1 capsule by mouth 2 (two) times daily.    [name]. levothyroxine (SYNTHROID, LEVOTHROID) 100 MCG tablet Take 1 tablet by mouth daily.    [name]. omeprazole (PRILOSEC) 20 MG capsule Take 20 mg by mouth daily.    . polyethylene glycol (MIRALAX / GLYCOLAX) packet Take 17 g by mouth daily as needed. Until stooling regularly 30 packet 0  . Prenatal Vit-Fe Fumarate-FA (PNV PRENATAL PLUS MULTIVITAMIN) 27-1 MG TABS Take 1 tablet by mouth daily.    . traMADol (ULTRAM) 50 MG tablet Take 100 mg by mouth 3 (three) times daily.     [name]. warfarin (COUMADIN) 4 MG tablet Take 1 tablet (4 mg total) by mouth daily except for Mondays, Wednesdays, and Fridays take 0.5 tablet (2 mg total).     No current facility-administered medications for this visit.     Past Medical History  Diagnosis Date  . Elevated PSA   . Aortic insufficiency   . Thoracic aortic aneurysm (HCC)   . Carpal tunnel syndrome   . SVT (supraventricular tachycardia) (HCC)   . Amaurosis fugax   . BPH (benign prostatic hypertrophy)   . Hypothyroidism   . Congestive heart failure (CHF) (HCC)   . Atrial fibrillation (HCC)   . Presence of permanent cardiac pacemaker     Medtronic  . Lower GI bleeding ~ 08/2014  . History of blood transfusion ~ 08/2014    related to LGIB  . Anemia   . History of gout   . S/P Bentall aortic root replacement with mechanical  valve conduit 01/19/2005    27 mm Carboseal Valsalva mechanical valve with synthetic root and ascending thoracic aortic graft with reimplantation of left main and right coronary arteries  . 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 Insert / replace / remove pacemaker      Medtronic  . Median sternotomy    . Total knee arthroplasty Right   . Carpal tunnel release Right   . Joint  replacement    . Inguinal hernia repair Bilateral   . Mechanical aortic valve replacement  01/19/2005    27 mm Carbomedics  . Ascending aortic aneurysm repair w/ mechanical aortic valve replacement  01/19/2005    27 mm Carboseal Valsalva valve conduit    Social History   Social History  . Marital Status: Married    Spouse Name: N/A  . Number of Children: 3  . Years of Education: N/A   Occupational History  . retired     Visual merchandiser   Social History Main Topics  . Smoking status: Never Smoker   . Smokeless tobacco: Never Used  . Alcohol Use: No  . Drug Use: No  . Sexual Activity: Not on file   Other Topics Concern  . Not on file   Social History Narrative   Lives with his wife who does the cooking and make sure he takes his medicines appropriately.    ROS: no fevers or chills, productive cough, hemoptysis, dysphasia, odynophagia, melena, hematochezia, dysuria, hematuria, rash, seizure activity, orthopnea, PND, pedal edema, claudication. Remaining systems are negative.  Physical Exam: Well-developed well-nourished in no acute distress.  Skin is warm and dry.  HEENT is normal.  Neck is supple.  Chest is clear to auscultation with normal expansion.  Cardiovascular exam is regular rate and rhythm. Crisp mechanical valve sound. Abdominal exam nontender or distended. No masses palpated. Extremities show no edema. neuro grossly intact  ECG Sinus rhythm at a rate of 72. First-degree AV block. Prolonged QT interval. Diffuse T-wave inversion.

## 2015-10-30 ENCOUNTER — Ambulatory Visit (INDEPENDENT_AMBULATORY_CARE_PROVIDER_SITE_OTHER): Payer: Medicare Other | Admitting: Cardiology

## 2015-10-30 ENCOUNTER — Encounter: Payer: Self-pay | Admitting: Cardiology

## 2015-10-30 ENCOUNTER — Ambulatory Visit (INDEPENDENT_AMBULATORY_CARE_PROVIDER_SITE_OTHER): Payer: Medicare Other | Admitting: Pharmacist Clinician (PhC)/ Clinical Pharmacy Specialist

## 2015-10-30 VITALS — BP 146/60 | HR 72 | Ht 72.0 in | Wt 190.0 lb

## 2015-10-30 DIAGNOSIS — Z7901 Long term (current) use of anticoagulants: Secondary | ICD-10-CM | POA: Diagnosis not present

## 2015-10-30 DIAGNOSIS — I5032 Chronic diastolic (congestive) heart failure: Secondary | ICD-10-CM

## 2015-10-30 DIAGNOSIS — I359 Nonrheumatic aortic valve disorder, unspecified: Secondary | ICD-10-CM

## 2015-10-30 DIAGNOSIS — I10 Essential (primary) hypertension: Secondary | ICD-10-CM

## 2015-10-30 DIAGNOSIS — I712 Thoracic aortic aneurysm, without rupture, unspecified: Secondary | ICD-10-CM

## 2015-10-30 DIAGNOSIS — I480 Paroxysmal atrial fibrillation: Secondary | ICD-10-CM | POA: Diagnosis not present

## 2015-10-30 DIAGNOSIS — I6523 Occlusion and stenosis of bilateral carotid arteries: Secondary | ICD-10-CM | POA: Diagnosis not present

## 2015-10-30 DIAGNOSIS — Z950 Presence of cardiac pacemaker: Secondary | ICD-10-CM

## 2015-10-30 LAB — BASIC METABOLIC PANEL
BUN: 25 mg/dL (ref 7–25)
CHLORIDE: 103 mmol/L (ref 98–110)
CO2: 23 mmol/L (ref 20–31)
Calcium: 8.7 mg/dL (ref 8.6–10.3)
Creat: 2 mg/dL — ABNORMAL HIGH (ref 0.70–1.11)
GLUCOSE: 127 mg/dL — AB (ref 65–99)
POTASSIUM: 4.9 mmol/L (ref 3.5–5.3)
SODIUM: 138 mmol/L (ref 135–146)

## 2015-10-30 LAB — POCT INR: INR: 4.7

## 2015-10-30 NOTE — Patient Instructions (Addendum)
Medication Instructions:   NO CHANGE  Labwork:  Your physician recommends that you HAVE LAB WORK TODAY  Follow-Up:  Your physician recommends that you schedule a follow-up appointment in: 3 MONTHS WITH DR CRENSHAW   If you need a refill on your cardiac medications before your next appointment, please call your pharmacy.    

## 2015-10-30 NOTE — Assessment & Plan Note (Signed)
Patient appears to be euvolemic on examination. Continue present dose of Lasix.Check potassium and renal function. 

## 2015-10-30 NOTE — Assessment & Plan Note (Signed)
Patient remains in sinus rhythm on examination. Continue amiodarone and Coumadin. When he returns in 3 months we will repeat liver functions, TSH and chest x-ray.

## 2015-10-30 NOTE — Assessment & Plan Note (Signed)
Patient will need follow-up CTA with Dr. Cornelius Moraswen.

## 2015-10-30 NOTE — Assessment & Plan Note (Signed)
Continue SBE prophylaxis. 

## 2015-10-30 NOTE — Assessment & Plan Note (Signed)
Continue amiodarone. 

## 2015-10-30 NOTE — Assessment & Plan Note (Signed)
Followed by electrophysiology. Nearing generator replacement by patient report.

## 2015-10-30 NOTE — Assessment & Plan Note (Signed)
Blood pressure controlled. Continue present medications. I would like for his blood pressure to run higher given his complaints of dizziness.

## 2015-11-02 ENCOUNTER — Other Ambulatory Visit: Payer: Self-pay | Admitting: Cardiology

## 2015-11-09 ENCOUNTER — Encounter: Payer: Self-pay | Admitting: Physician Assistant

## 2015-11-09 NOTE — Progress Notes (Addendum)
Cardiology Office Note Date:  11/10/2015  Patient ID:  [name] [name], [name] [name] 05/30/1928, MRN [id number] PCP:  [name] Miyamoto, MD  Cardiologist:  Dr. Jens [name] Electrophysiologist: Dr. Ladona [name] Vascular: Dr. Cornelius [name]    Chief Complaint: The patient's PPM has reached ERI.  History of Present Illness: [name] [name] is a 81 y.o. male with history of AVR; hx of aortic insufficiency and ascending thoracic aortic aneurysm s/p Bentall procedure with mechanical AVR in 2006, sinus bradycardia status post permanent pacemaker implantation, PAF, SVT.Patient was seen by Dr. Ladona [name] 10/15 with recurrent and increasingly symptomatic atrial fibrillation. He has also been noted to have MAT. Amiodarone initiated. Echocardiogram 10/15 demonstrated normal LV function with an EF of 50-55%. AVR was functioning appropriately. 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 Followed by Dr [name] [name].  He feels at what the patient's wife and daughter feel are about his baseline.  He has what sounds like sone orthostatic c/o lightheadedness, this dates back a year or so, his primary cardiologist has suggested to keep his BP a bit on the higher side for this.  He remarks he has terrible knees and pain, and ambulates only as much as necessary, his daughter states he spends most of his time in his recliner.  He denies any kind of CP, no near syncope or syncope, no rest SOB but gets winded with ambulation, they state has been attributed to a very sedentary lifestyle at this point.   Past Medical History  Diagnosis Date  . Elevated PSA   . Aortic insufficiency   . Thoracic aortic aneurysm (HCC)   . Carpal tunnel syndrome   . SVT (supraventricular tachycardia) (HCC)   . Amaurosis fugax   . BPH (benign prostatic hypertrophy)   . Hypothyroidism   . Congestive heart failure (CHF) (HCC)   . Atrial fibrillation (HCC)   . Presence of permanent cardiac pacemaker     Medtronic, 2006, SSS, bradycardia, Dr. Ladona [name]  . Lower GI bleeding ~  08/2014  . History of blood transfusion ~ 08/2014    related to LGIB  . Anemia   . History of gout   . 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 Insert / replace / remove pacemaker      Medtronic  . Median sternotomy    . Total knee arthroplasty Right   . Carpal tunnel release Right   . Joint replacement    . Inguinal hernia repair Bilateral   . Mechanical aortic valve replacement  01/19/2005    27 mm Carbomedics  . Ascending aortic aneurysm repair w/ mechanical aortic valve replacement  01/19/2005    27 mm Carboseal Valsalva valve conduit    Current Outpatient Prescriptions  Medication Sig Dispense Refill  . alfuzosin (UROXATRAL) 10 MG 24 hr tablet Take 10 mg by mouth daily.     [name] amiodarone (PACERONE) 200 MG tablet Take 1 tablet (200 mg total) by mouth daily. 30 tablet 5  . diltiazem (CARDIZEM CD) 180 MG 24 hr capsule Take 1 capsule (180 mg total) by mouth daily. 90 capsule 3  . finasteride (PROSCAR) 5 MG tablet Take 5 mg by mouth daily.    [name]  furosemide (LASIX) 20 MG tablet TAKE ONE TABLET EVERY OTHER DAY 45 tablet 3  . furosemide (LASIX) 40 MG tablet Take 1 tablet by mouth daily as needed for fluid or edema.     . Lactobacillus (ACIDOPHILUS PO) Take 1 capsule by mouth 2 (two) times daily.    [name] levothyroxine (SYNTHROID, LEVOTHROID) 100 MCG tablet Take 1 tablet by mouth daily.    [name] omeprazole (PRILOSEC) 20 MG capsule Take 20 mg by mouth daily.    . polyethylene glycol (MIRALAX / GLYCOLAX) packet Take 17 g by mouth daily as needed. Until stooling regularly 30 packet 0  . Prenatal Vit-Fe  Fumarate-FA (PNV PRENATAL PLUS MULTIVITAMIN) 27-1 MG TABS Take 1 tablet by mouth daily.    . traMADol (ULTRAM) 50 MG tablet Take 100 mg by mouth 3 (three) times daily.     [name] warfarin (COUMADIN) 4 MG tablet Take 1 tablet (4 mg total) by mouth daily except for Mondays, Wednesdays, and Fridays take 0.5 tablet (2 mg total).     No current facility-administered medications for this visit.    Allergies:   Review of patient's allergies indicates no known allergies.   Social History:  The patient  reports that he has never smoked. He has never used smokeless tobacco. He reports that he does not drink alcohol or use illicit drugs.   Family History:  The patient's family history includes Dementia in his sister; Heart disease in his brother; Hypertension in his mother; Stroke in his father.  ROS:  Please see the history of present illness.    All other systems are reviewed and otherwise negative.   PHYSICAL EXAM:  VS:  BP 154/68 mmHg  Pulse 86  Ht 6' (1.829 m)  Wt 207 lb (93.895 kg)  BMI 28.07 kg/m2 BMI: Body mass index is 28.07 kg/(m^2). Well nourished, well nourished, elderly male, in no acute distress HEENT: normocephalic, atraumatic Neck: no JVD, carotid bruits or masses Cardiac: prosthetic valve is appreciated; RRR; no significant murmurs, no rubs, or gallops Lungs:  clear to auscultation bilaterally, no wheezing, rhonchi or rales Abd: soft, nontender,  + BS MS: no deformity or atrophy Ext: no edema Skin: warm and dry, no rash Neuro:  No gross deficits appreciated Psych: euthymic mood, full affect  PPM site is stable, no tethering or discomfort   EKG:  Done today shows SR, 1st degree AVBlock ( ), LAD, diffuse ST/T changes appear similar to prior, QTc462ms (prior 479)  Studies: - LHC (1/06): Proximal RCA 30%, large thoracic aortic aneurysm (7 cm by CT scan) - Echo (10/18/2014): Mild concentric hypertrophy. EF 50% to 55%. Wall motion was normal. Grade 1 diastolic  dysfunction. Aortic valve: A mechanical prosthesis was present. There was mildregurgitation. Mean gradient (S): 10 mm Hg. Peak gradient (S): 22 mm Hg.Moderate LAE, mild RAE - Carotid US (02/23/15): No significant stenosis  Recent Labs: 07/30/2015: ALT 21; Hemoglobin 14.6; Platelets 168.0; TSH 2.57 10/30/2015: BUN 25; Creat 2.00*; Potassium 4.9; Sodium 138  07/30/15 BUN/Creat 32/1.56, K+ 4.1   Estimated Creatinine Clearance: 32.1 mL/min (by C-G formula based on Cr of 2).   Wt Readings from Last 3 Encounters:  11/10/15 207 lb (93.895 kg)  10/30/15 190 lb (86.183 kg)  07/30/15 198 lb (89.812 kg)     DEVICE INFORMATION: MDT Enrhythm, implanted 02/14/05 by Dr. Ladona [name], indication SSS, bradycardia  Other studies reviewed: Additional studies/records reviewed today include: cardiology notes/visits, summarized above  ASSESSMENT AND PLAN:  1. PPM has reached ERI at 09/16/15  Plan for generator change, coumadin management peri-op pending labs     Risks/benefits discussed with the patient and family present and they wished to proceed     Continue Carelink/remotes post-op 2. Renal insufficiency     The patient's wife/daughter mention Dr. Ludwig Clarksrenshaw's has contacted them about his labs, will defer to his service 3. HTN     Some orthostatic symptoms (not new, they date back a year or so), noting Dr. Jens Somrenshaw has notes have suggested keeping his BP on the higher side for this 4. DOE     He appears euvolemic on exam, very sedentary and likely more of a deconditioning then anything else.  His family describe this as slow progression over time. Weight appears up, bu he is not edematous, and exam is negative for any evidence of fluid OL.  They report rarely using any PRN lasix 5. AVR, mechanical valve/ PAFib     On Coumadin managed with coumadin clinic 6. Hx of Bentall procedure, Aortic root replacement/AVR, ascending aorta     follows with vascular 7. PVCs     On amiodarone, labs stable, Dr. Jens Somrenshaw  isi following     QTc appears stable      Disposition: F/u with wound check post-op, and Dr. Ladona Ridgelaylor 71mo post-op.  Dr. Jens Somrenshaw and vascular as directed by them  Current medicines are reviewed at length with the patient today.  The patient did not have any concerns regarding medicines.  Judith BlonderSigned, Renee Ursy, PA-C 11/10/2015 10:11 AM     [facility] [address] [place] [id number] [phone] (office)  [phone] (fax)

## 2015-11-10 ENCOUNTER — Ambulatory Visit (INDEPENDENT_AMBULATORY_CARE_PROVIDER_SITE_OTHER): Payer: Medicare Other | Admitting: Physician Assistant

## 2015-11-10 ENCOUNTER — Ambulatory Visit (INDEPENDENT_AMBULATORY_CARE_PROVIDER_SITE_OTHER): Payer: Medicare Other | Admitting: Pharmacist Clinician (PhC)/ Clinical Pharmacy Specialist

## 2015-11-10 ENCOUNTER — Encounter: Payer: Self-pay | Admitting: *Deleted

## 2015-11-10 ENCOUNTER — Encounter: Payer: Self-pay | Admitting: Physician Assistant

## 2015-11-10 VITALS — BP 154/68 | HR 86 | Ht 72.0 in | Wt 207.0 lb

## 2015-11-10 DIAGNOSIS — Z7901 Long term (current) use of anticoagulants: Secondary | ICD-10-CM | POA: Diagnosis not present

## 2015-11-10 DIAGNOSIS — N289 Disorder of kidney and ureter, unspecified: Secondary | ICD-10-CM

## 2015-11-10 DIAGNOSIS — I480 Paroxysmal atrial fibrillation: Secondary | ICD-10-CM | POA: Diagnosis not present

## 2015-11-10 DIAGNOSIS — I359 Nonrheumatic aortic valve disorder, unspecified: Secondary | ICD-10-CM | POA: Diagnosis not present

## 2015-11-10 DIAGNOSIS — I517 Cardiomegaly: Secondary | ICD-10-CM | POA: Diagnosis not present

## 2015-11-10 DIAGNOSIS — I495 Sick sinus syndrome: Secondary | ICD-10-CM

## 2015-11-10 DIAGNOSIS — I6523 Occlusion and stenosis of bilateral carotid arteries: Secondary | ICD-10-CM

## 2015-11-10 DIAGNOSIS — I351 Nonrheumatic aortic (valve) insufficiency: Secondary | ICD-10-CM

## 2015-11-10 DIAGNOSIS — I493 Ventricular premature depolarization: Secondary | ICD-10-CM

## 2015-11-10 LAB — POCT INR: INR: 3

## 2015-11-10 NOTE — Patient Instructions (Signed)
Medication Instructions:   Your physician recommends that you continue on your current medications as directed. Please refer to the Current Medication list given to you today.     If you need a refill on your cardiac medications before your next appointment, please call your pharmacy.  Labwork:  BMET CBC PT/ INR  RETURN 11/23/15    Testing/Procedures:  SEE LETTER   Follow-Up:  14 DAYS WOUND CHECK WITH DEVICE CLINIC:  12/10/15  91 DAYS PPM PHYS CK FOLLOW UP TAYLOR  :  01/26/2016     Any Other Special Instructions Will Be Listed Below (If Applicable).

## 2015-11-20 ENCOUNTER — Other Ambulatory Visit: Payer: Self-pay | Admitting: Physician Assistant

## 2015-11-23 ENCOUNTER — Other Ambulatory Visit (INDEPENDENT_AMBULATORY_CARE_PROVIDER_SITE_OTHER): Payer: Medicare Other | Admitting: *Deleted

## 2015-11-23 ENCOUNTER — Ambulatory Visit (INDEPENDENT_AMBULATORY_CARE_PROVIDER_SITE_OTHER): Payer: Medicare Other | Admitting: Pharmacist Clinician (PhC)/ Clinical Pharmacy Specialist

## 2015-11-23 DIAGNOSIS — I359 Nonrheumatic aortic valve disorder, unspecified: Secondary | ICD-10-CM | POA: Diagnosis not present

## 2015-11-23 DIAGNOSIS — Z7901 Long term (current) use of anticoagulants: Secondary | ICD-10-CM

## 2015-11-23 DIAGNOSIS — I495 Sick sinus syndrome: Secondary | ICD-10-CM

## 2015-11-23 LAB — CBC WITH DIFFERENTIAL/PLATELET
Basophils Absolute: 0.1 10*3/uL (ref 0.0–0.1)
Basophils Relative: 1 % (ref 0–1)
EOS PCT: 1 % (ref 0–5)
Eosinophils Absolute: 0.1 10*3/uL (ref 0.0–0.7)
HCT: 41.5 % (ref 39.0–52.0)
Hemoglobin: 14.2 g/dL (ref 13.0–17.0)
LYMPHS ABS: 0.9 10*3/uL (ref 0.7–4.0)
Lymphocytes Relative: 11 % — ABNORMAL LOW (ref 12–46)
MCH: 29.4 pg (ref 26.0–34.0)
MCHC: 34.2 g/dL (ref 30.0–36.0)
MCV: 85.9 fL (ref 78.0–100.0)
MONOS PCT: 8 % (ref 3–12)
MPV: 9.6 fL (ref 8.6–12.4)
Monocytes Absolute: 0.6 10*3/uL (ref 0.1–1.0)
Neutro Abs: 6.2 10*3/uL (ref 1.7–7.7)
Neutrophils Relative %: 79 % — ABNORMAL HIGH (ref 43–77)
PLATELETS: 209 10*3/uL (ref 150–400)
RBC: 4.83 MIL/uL (ref 4.22–5.81)
RDW: 15.4 % (ref 11.5–15.5)
WBC: 7.9 10*3/uL (ref 4.0–10.5)

## 2015-11-23 LAB — BASIC METABOLIC PANEL
BUN: 28 mg/dL — AB (ref 7–25)
CALCIUM: 8.9 mg/dL (ref 8.6–10.3)
CO2: 24 mmol/L (ref 20–31)
CREATININE: 2.01 mg/dL — AB (ref 0.70–1.11)
Chloride: 103 mmol/L (ref 98–110)
Glucose, Bld: 106 mg/dL — ABNORMAL HIGH (ref 65–99)
Potassium: 4.3 mmol/L (ref 3.5–5.3)
Sodium: 138 mmol/L (ref 135–146)

## 2015-11-23 LAB — PROTIME-INR
INR: 2.32 — ABNORMAL HIGH (ref <1.50)
Prothrombin Time: 25.8 seconds — ABNORMAL HIGH (ref 11.6–15.2)

## 2015-11-23 LAB — POCT INR: INR: 2.5

## 2015-11-24 ENCOUNTER — Telehealth: Payer: Self-pay | Admitting: *Deleted

## 2015-11-25 ENCOUNTER — Encounter (HOSPITAL_COMMUNITY): Payer: Self-pay | Admitting: Internal Medicine

## 2015-11-25 ENCOUNTER — Encounter (HOSPITAL_COMMUNITY)
Admission: RE | Disposition: A | Discharge status: Home / Self Care | Payer: Medicare Other | Source: Ambulatory Visit | Attending: Internal Medicine

## 2015-11-25 ENCOUNTER — Ambulatory Visit (HOSPITAL_COMMUNITY)
Admission: RE | Admit: 2015-11-25 | Discharge: 2015-11-25 | Disposition: A | Discharge status: Home / Self Care | Payer: Medicare Other | Source: Ambulatory Visit | Attending: Internal Medicine | Admitting: Internal Medicine

## 2015-11-25 DIAGNOSIS — Z45010 Encounter for checking and testing of cardiac pacemaker pulse generator [battery]: Secondary | ICD-10-CM | POA: Insufficient documentation

## 2015-11-25 DIAGNOSIS — I493 Ventricular premature depolarization: Secondary | ICD-10-CM | POA: Diagnosis not present

## 2015-11-25 DIAGNOSIS — I495 Sick sinus syndrome: Secondary | ICD-10-CM | POA: Diagnosis present

## 2015-11-25 DIAGNOSIS — Z952 Presence of prosthetic heart valve: Secondary | ICD-10-CM | POA: Insufficient documentation

## 2015-11-25 DIAGNOSIS — I480 Paroxysmal atrial fibrillation: Secondary | ICD-10-CM | POA: Insufficient documentation

## 2015-11-25 DIAGNOSIS — E039 Hypothyroidism, unspecified: Secondary | ICD-10-CM | POA: Insufficient documentation

## 2015-11-25 DIAGNOSIS — N289 Disorder of kidney and ureter, unspecified: Secondary | ICD-10-CM | POA: Insufficient documentation

## 2015-11-25 DIAGNOSIS — Z7901 Long term (current) use of anticoagulants: Secondary | ICD-10-CM | POA: Diagnosis not present

## 2015-11-25 DIAGNOSIS — I442 Atrioventricular block, complete: Secondary | ICD-10-CM | POA: Insufficient documentation

## 2015-11-25 DIAGNOSIS — M109 Gout, unspecified: Secondary | ICD-10-CM | POA: Diagnosis not present

## 2015-11-25 DIAGNOSIS — I10 Essential (primary) hypertension: Secondary | ICD-10-CM | POA: Diagnosis not present

## 2015-11-25 DIAGNOSIS — I509 Heart failure, unspecified: Secondary | ICD-10-CM | POA: Diagnosis not present

## 2015-11-25 DIAGNOSIS — I471 Supraventricular tachycardia: Secondary | ICD-10-CM | POA: Insufficient documentation

## 2015-11-25 HISTORY — PX: EP IMPLANTABLE DEVICE: SHX172B

## 2015-11-25 LAB — SURGICAL PCR SCREEN
MRSA, PCR: NEGATIVE
Staphylococcus aureus: NEGATIVE

## 2015-11-25 LAB — PROTIME-INR
INR: 2.16 — AB (ref 0.00–1.49)
Prothrombin Time: 23.9 seconds — ABNORMAL HIGH (ref 11.6–15.2)

## 2015-11-25 SURGERY — PPM/BIV PPM GENERATOR CHANGEOUT

## 2015-11-25 MED ORDER — CHLORHEXIDINE GLUCONATE 4 % EX LIQD
60.0000 mL | Freq: Once | CUTANEOUS | Status: DC
  Filled 2015-11-25: qty 60

## 2015-11-25 MED ORDER — SODIUM CHLORIDE 0.9 % IV SOLN
INTRAVENOUS | Status: DC
  Administered 2015-11-25: 07:00:00 via INTRAVENOUS

## 2015-11-25 MED ORDER — CEFAZOLIN SODIUM-DEXTROSE 2-3 GM-% IV SOLR
INTRAVENOUS | Status: AC
  Filled 2015-11-25: qty 50

## 2015-11-25 MED ORDER — MIDAZOLAM HCL 5 MG/5ML IJ SOLN
INTRAMUSCULAR | Status: AC
  Filled 2015-11-25: qty 5

## 2015-11-25 MED ORDER — LIDOCAINE HCL (PF) 1 % IJ SOLN
INTRAMUSCULAR | Status: DC | PRN
  Administered 2015-11-25: 45 mL

## 2015-11-25 MED ORDER — LIDOCAINE HCL (PF) 1 % IJ SOLN
INTRAMUSCULAR | Status: AC
  Filled 2015-11-25: qty 60

## 2015-11-25 MED ORDER — GENTAMICIN SULFATE 40 MG/ML IJ SOLN
80.0000 mg | INTRAMUSCULAR | Status: AC
  Administered 2015-11-25: 80 mg

## 2015-11-25 MED ORDER — ONDANSETRON HCL 4 MG/2ML IJ SOLN: 4.0000 mg | Freq: Four times a day (QID) | INTRAMUSCULAR | Status: DC | PRN

## 2015-11-25 MED ORDER — METOPROLOL TARTRATE 1 MG/ML IV SOLN
INTRAVENOUS | Status: DC | PRN
  Administered 2015-11-25: 5 mg via INTRAVENOUS

## 2015-11-25 MED ORDER — CEFAZOLIN SODIUM-DEXTROSE 2-3 GM-% IV SOLR
2.0000 g | INTRAVENOUS | Status: AC
  Administered 2015-11-25: 2 g via INTRAVENOUS

## 2015-11-25 MED ORDER — GENTAMICIN SULFATE 40 MG/ML IJ SOLN
INTRAMUSCULAR | Status: AC
  Filled 2015-11-25: qty 2

## 2015-11-25 MED ORDER — ACETAMINOPHEN 325 MG PO TABS
325.0000 mg | ORAL_TABLET | ORAL | Status: DC | PRN
  Filled 2015-11-25: qty 2

## 2015-11-25 MED ORDER — MUPIROCIN 2 % EX OINT
1.0000 "application " | TOPICAL_OINTMENT | Freq: Once | CUTANEOUS | Status: AC
  Administered 2015-11-25: 1 via TOPICAL
  Filled 2015-11-25: qty 22

## 2015-11-25 MED ORDER — FENTANYL CITRATE (PF) 100 MCG/2ML IJ SOLN
INTRAMUSCULAR | Status: AC
  Filled 2015-11-25: qty 2

## 2015-11-25 MED ORDER — METOPROLOL TARTRATE 1 MG/ML IV SOLN
INTRAVENOUS | Status: AC
  Filled 2015-11-25: qty 5

## 2015-11-25 MED ORDER — MUPIROCIN 2 % EX OINT
TOPICAL_OINTMENT | CUTANEOUS | Status: AC
  Filled 2015-11-25: qty 22

## 2015-11-25 SURGICAL SUPPLY — 5 items
CABLE SURGICAL S-101-97-12 (CABLE) ×2 IMPLANT
PACEMAKER ADAPTA DR ADDRL1 (Pacemaker) IMPLANT
PAD DEFIB LIFELINK (PAD) ×2 IMPLANT
PPM ADAPTA DR ADDRL1 (Pacemaker) ×3 IMPLANT
TRAY PACEMAKER INSERTION (PACKS) ×2 IMPLANT

## 2015-11-25 NOTE — Discharge Instructions (Signed)
Pacemaker Battery Change °A pacemaker battery usually lasts 4 to 12 years. Once or twice per year, you will be asked to visit your health care provider to have a full evaluation of your pacemaker. When a battery needs to be replaced, the entire pacemaker is replaced so that you can benefit from new circuitry and any new features that have been added to pacemakers. Most often, this procedure is very simple because the leads are already in place.  °There are many things that affect how long a pacemaker battery will last, including:  °· The age of the pacemaker.   °· The number of leads (1, 2, or 3).   °· The pacemaker work load. If the pacemaker is helping the heart more often, the battery will not last as long as it would if the pacemaker did not need to help the heart.   °· Power (voltage) settings.  °LET YOUR HEALTH CARE PROVIDER KNOW ABOUT:  °· Any allergies you have.   °· All medicines you are taking, including vitamins, herbs, eye drops, creams, and over-the-counter medicines.   °· Previous problems you or members of your family have had with the use of anesthetics.   °· Any blood disorders you have.   °· Previous surgeries you have had, especially since your last pacemaker placement.   °· Medical conditions you have.   °· Possibility of pregnancy, if this applies. °· Symptoms of chest pain, trouble breathing, palpitations, light-headedness, or feelings of an abnormal or irregular heartbeat. °RISKS AND COMPLICATIONS  °Generally, this is a safe procedure. However, as with any procedure, problems can occur and include:  °· Bleeding.   °· Bruising of the skin around where the incision was made.   °· Pain at the incision site.   °· Pulling apart of the skin at the incision site.   °· Infection.   °· Allergic reaction to anesthetics or other medicines used during the procedure.   °People with diabetes may have a temporary increase in their blood sugar after any surgical procedure.  °BEFORE THE PROCEDURE  °· Wash all  of the skin around the area of the chest where the pacemaker is located.   °· Ask your health care provider for help with any medicine adjustments before the pacemaker is replaced.   °· Do not eat or drink anything after midnight on the night before the procedure or as directed by your health care provider. °· Ask your health care provider if you can take a sip of water with any approved medicines the morning of the procedure. °PROCEDURE  °· After giving medicine to numb the skin (local anesthetic), your health care provider will make a cut to reopen the pocket holding the pacemaker.   °· The old pacemaker will be disconnected from its leads.   °· The leads will be tested.   °· If needed, the leads will be replaced. If the leads are functioning properly, the new pacemaker may be connected to the existing leads. °· A heart monitor and the pacemaker programmer will be used to make sure that the new pacemaker is working properly. °· The incision site will then be closed. A dressing will be placed over the pacemaker site. The dressing will be removed 24-48 hours afterward. °AFTER THE PROCEDURE  °· You will be taken to a recovery area after the new pacemaker implant is completed. Your vital signs such as blood pressure, heart rate, breathing, and oxygen levels will be monitored. °· Your health care provider will tell you when you will need to next test your pacemaker or when to return to the office for follow-up   for removal of stitches. °  °This information is not intended to replace advice given to you by your health care provider. Make sure you discuss any questions you have with your health care provider. °  °Document Released: 03/22/2007 Document Revised: 01/02/2015 Document Reviewed: 06/26/2013 °Elsevier Interactive Patient Education ©2016 [employer] ° °

## 2015-11-25 NOTE — H&P (Signed)
Expand All Collapse All      Cardiology Office Note Date: 11/10/2015  Patient ID: [name] [name], [name] [name] 03-03-1928, MRN [id number] PCP: Abigail Miyamoto, MD Cardiologist: Dr. Jens Som Electrophysiologist: Dr. Ladona Ridgel Vascular: Dr. Cornelius Moras   Chief Complaint: The patient's PPM has reached ERI.  History of Present Illness: [name] [name] is a 81 y.o. male with history of AVR; hx of aortic insufficiency and ascending thoracic aortic aneurysm s/p Bentall procedure with mechanical AVR in 2006, sinus bradycardia status post permanent pacemaker implantation, PAF, SVT.Patient was seen by Dr. Ladona Ridgel 10/15 with recurrent and increasingly symptomatic atrial fibrillation. He has also been noted to have MAT. Amiodarone initiated. Echocardiogram 10/15 demonstrated normal LV function with an EF of 50-55%. AVR was functioning appropriately. 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 Followed by Dr Cornelius Moras.  He feels at what the patient's wife and daughter feel are about his baseline. He has what sounds like sone orthostatic c/o lightheadedness, this dates back a year or so, his primary cardiologist has suggested to keep his BP a bit on the higher side for this. He remarks he has terrible knees and pain, and ambulates only as much as necessary, his daughter states he spends most of his time in his recliner. He denies any kind of CP, no near syncope or syncope, no rest SOB but gets winded with ambulation, they state has been attributed to a very sedentary lifestyle at this point.   Past Medical History  Diagnosis Date  . Elevated PSA   . Aortic insufficiency   . Thoracic aortic aneurysm (HCC)   . Carpal tunnel syndrome   . SVT (supraventricular tachycardia) (HCC)   . Amaurosis fugax   . BPH (benign prostatic hypertrophy)   . Hypothyroidism   . Congestive heart failure (CHF) (HCC)   . Atrial fibrillation (HCC)   . Presence of permanent cardiac pacemaker      Medtronic, 2006, SSS, bradycardia, Dr. Ladona Ridgel  . Lower GI bleeding ~ 08/2014  . History of blood transfusion ~ 08/2014    related to LGIB  . Anemia   . History of gout   . 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 Insert / replace / remove pacemaker      Medtronic  . Median sternotomy    . Total knee arthroplasty Right   . Carpal tunnel release Right   . Joint replacement    . Inguinal hernia repair Bilateral   . Mechanical aortic valve replacement  01/19/2005    27 mm Carbomedics  . Ascending aortic aneurysm repair w/ mechanical aortic valve replacement  01/19/2005    27 mm Carboseal Valsalva valve conduit    Current Outpatient Prescriptions  Medication Sig Dispense Refill  . alfuzosin (UROXATRAL) 10 MG 24 hr tablet Take 10 mg by mouth daily.     [name] amiodarone (PACERONE) 200 MG tablet Take 1 tablet (200 mg total) by mouth daily. 30 tablet 5  . diltiazem (CARDIZEM CD) 180 MG 24 hr capsule Take 1 capsule (180 mg total) by mouth daily. 90 capsule 3  . finasteride (PROSCAR) 5 MG tablet Take 5 mg by mouth daily.    [name]  furosemide (LASIX) 20 MG tablet TAKE ONE TABLET EVERY OTHER DAY 45 tablet 3  . furosemide (LASIX) 40 MG tablet Take 1 tablet by mouth daily as needed for fluid or edema.     . Lactobacillus (ACIDOPHILUS PO) Take 1 capsule by mouth 2 (two) times daily.    [name] levothyroxine (SYNTHROID, LEVOTHROID) 100 MCG tablet Take 1 tablet by mouth daily.    [name] omeprazole  (PRILOSEC) 20 MG capsule Take 20 mg by mouth daily.    . polyethylene glycol (MIRALAX / GLYCOLAX) packet Take 17 g by mouth daily as needed. Until stooling regularly 30 packet 0  . Prenatal Vit-Fe Fumarate-FA (PNV PRENATAL PLUS MULTIVITAMIN) 27-1 MG TABS Take 1 tablet by mouth daily.    . traMADol (ULTRAM) 50 MG tablet Take 100 mg by mouth 3 (three) times daily.     [name] warfarin (COUMADIN) 4 MG tablet Take 1 tablet (4 mg total) by mouth daily except for Mondays, Wednesdays, and Fridays take 0.5 tablet (2 mg total).     No current facility-administered medications for this visit.    Allergies: Review of patient's allergies indicates no known allergies.   Social History: The patient  reports that he has never smoked. He has never used smokeless tobacco. He reports that he does not drink alcohol or use illicit drugs.   Family History: The patient's family history includes Dementia in his sister; Heart disease in his brother; Hypertension in his mother; Stroke in his father.  ROS: Please see the history of present illness.  All other systems are reviewed and otherwise negative.   PHYSICAL EXAM:  VS: BP 154/68 mmHg  Pulse 86  Ht 6' (1.829 m)  Wt 207 lb (93.895 kg)  BMI 28.07 kg/m2 BMI: Body mass index is 28.07 kg/(m^2). Well nourished, well nourished, elderly male, in no acute distress  HEENT: normocephalic, atraumatic  Neck: no JVD, carotid bruits or masses Cardiac: prosthetic valve is appreciated; RRR; no significant murmurs, no rubs, or gallops Lungs: clear to auscultation bilaterally, no wheezing, rhonchi or rales  Abd: soft, nontender, + BS MS: no deformity or atrophy Ext: no edema  Skin: warm and dry, no rash Neuro: No gross deficits appreciated Psych: euthymic mood, full affect  PPM site is stable, no tethering or discomfort   EKG: Done today shows SR, 1st degree AVBlock ( ), LAD, diffuse ST/T changes appear similar to prior,  QTc478ms (prior 479)  Studies: - LHC (1/06): Proximal RCA 30%, large thoracic aortic aneurysm (7 cm by CT scan) - Echo (10/18/2014): Mild concentric hypertrophy. EF 50% to 55%. Wall motion was normal. Grade 1 diastolic dysfunction. Aortic valve: A mechanical prosthesis was present. There was mildregurgitation. Mean gradient (S): 10 mm Hg. Peak gradient (S): 22 mm Hg.Moderate LAE, mild RAE - Carotid US (02/23/15): No significant stenosis  Recent Labs: 07/30/2015: ALT 21; Hemoglobin 14.6; Platelets 168.0; TSH 2.57 10/30/2015: BUN 25; Creat 2.00*; Potassium 4.9; Sodium 138  07/30/15 BUN/Creat 32/1.56, K+ 4.1   Estimated Creatinine Clearance: 32.1 mL/min (by C-G formula based on Cr of 2).   Wt Readings from Last 3 Encounters:  11/10/15 207 lb (93.895 kg)  10/30/15 190 lb (86.183 kg)  07/30/15 198 lb (89.812 kg)     DEVICE INFORMATION: MDT Enrhythm, implanted 02/14/05 by Dr. Ladona Ridgel, indication SSS, bradycardia  Other studies reviewed: Additional studies/records reviewed today include: cardiology notes/visits, summarized above  ASSESSMENT AND PLAN:  1. PPM has reached ERI at 09/16/15  Plan for generator change, coumadin management peri-op pending labs  Risks/benefits discussed with the patient and family present and they wished to proceed  Continue Carelink/remotes post-op 2. Renal insufficiency  The patient's wife/daughter mention Dr. Ludwig Clarksrenshaw's has contacted them about his labs, will defer to his service 3. HTN  Some orthostatic symptoms (not new, they date back a year or so), noting Dr. Jens Somrenshaw has notes have suggested keeping his BP on the higher side for this 4. DOE  He appears euvolemic on exam, very sedentary and likely more of a deconditioning then anything else. His family describe this as slow progression over time. Weight appears up, bu he is not edematous, and exam is negative for any evidence of fluid OL. They report rarely using any PRN  lasix 5. AVR, mechanical valve/ PAFib  On Coumadin managed with coumadin clinic 6. Hx of Bentall procedure, Aortic root replacement/AVR, ascending aorta  follows with vascular 7. PVCs  On amiodarone, labs stable, Dr. Jens Somrenshaw isi following  QTc appears stable   Disposition: F/u with wound check post-op, and Dr. Ladona Ridgelaylor 66mo post-op. Dr. Jens Somrenshaw and vascular as directed by them  Current medicines are reviewed at length with the patient today. The patient did not have any concerns regarding medicines.  Judith BlonderSigned, Renee Ursy, PA-C 11/10/2015 10:11 AM         EP Attending  Patient seen and examined. He is well known to me and is admitted for PPM gen change as his current device has reached ERI. I have discussed the risks/benefits/goals/expectations of the procedure with the patient and he wishes to proceed.  Leonia ReevesGregg Huntley Knoop,M.D.

## 2015-11-26 MED FILL — Gentamicin Sulfate Inj 40 MG/ML: INTRAMUSCULAR | Qty: 2 | Status: AC

## 2015-11-26 MED FILL — Sodium Chloride Irrigation Soln 0.9%: Qty: 500 | Status: AC

## 2015-12-09 ENCOUNTER — Encounter: Payer: Self-pay | Admitting: Internal Medicine

## 2015-12-09 ENCOUNTER — Ambulatory Visit (INDEPENDENT_AMBULATORY_CARE_PROVIDER_SITE_OTHER): Payer: Medicare Other | Admitting: *Deleted

## 2015-12-09 DIAGNOSIS — I480 Paroxysmal atrial fibrillation: Secondary | ICD-10-CM

## 2015-12-09 DIAGNOSIS — Z7901 Long term (current) use of anticoagulants: Secondary | ICD-10-CM

## 2015-12-09 DIAGNOSIS — Z950 Presence of cardiac pacemaker: Secondary | ICD-10-CM | POA: Diagnosis not present

## 2015-12-09 DIAGNOSIS — I359 Nonrheumatic aortic valve disorder, unspecified: Secondary | ICD-10-CM

## 2015-12-09 LAB — CUP PACEART INCLINIC DEVICE CHECK
Battery Remaining Longevity: 169 mo
Battery Voltage: 2.8 V
Brady Statistic AS VS Percent: 60 %
Implantable Lead Implant Date: 20060220
Implantable Lead Implant Date: 20060220
Implantable Lead Model: 5076
Lead Channel Impedance Value: 520 Ohm
Lead Channel Impedance Value: 616 Ohm
Lead Channel Pacing Threshold Amplitude: 0.5 V
Lead Channel Pacing Threshold Amplitude: 0.5 V
Lead Channel Pacing Threshold Amplitude: 0.875 V
Lead Channel Pacing Threshold Pulse Width: 0.4 ms
Lead Channel Pacing Threshold Pulse Width: 0.4 ms
Lead Channel Setting Pacing Amplitude: 2.5 V
Lead Channel Setting Sensing Sensitivity: 4 mV
MDC IDC LEAD LOCATION: 753859
MDC IDC LEAD LOCATION: 753860
MDC IDC MSMT BATTERY IMPEDANCE: 100 Ohm
MDC IDC MSMT LEADCHNL RA PACING THRESHOLD PULSEWIDTH: 0.4 ms
MDC IDC MSMT LEADCHNL RA SENSING INTR AMPL: 4 mV
MDC IDC MSMT LEADCHNL RV PACING THRESHOLD AMPLITUDE: 1 V
MDC IDC MSMT LEADCHNL RV PACING THRESHOLD PULSEWIDTH: 0.4 ms
MDC IDC MSMT LEADCHNL RV SENSING INTR AMPL: 11.2 mV
MDC IDC SESS DTM: 20161214152917
MDC IDC SET LEADCHNL RA PACING AMPLITUDE: 2 V
MDC IDC SET LEADCHNL RV PACING PULSEWIDTH: 0.4 ms
MDC IDC STAT BRADY AP VP PERCENT: 0 %
MDC IDC STAT BRADY AP VS PERCENT: 39 %
MDC IDC STAT BRADY AS VP PERCENT: 0 %

## 2015-12-09 LAB — POCT INR: INR: 2

## 2015-12-09 NOTE — Progress Notes (Signed)
Wound check appointment s/p generator replacement. Steri-strips removed by pt's wife at home. Wound without redness or edema. Incision edges approximated, wound well healed aside from a suture protruding from medial incision. Suture clipped, antibiotic ointment and bandaid applied- instructed for pt to leave bandaid on until tomorrow when he showered. Normal device function. Thresholds, sensing, and impedances consistent with implant measurements. Histogram distribution appropriate for patient and level of activity. No mode switches or high ventricular rates noted. Patient educated about wound care, arm mobility, lifting restrictions. Pt and wife advised to call device clinic if incision becomes irritated where suture was clipped, they verbalized understanding. ROV with GT 01/26/16.

## 2015-12-23 ENCOUNTER — Ambulatory Visit (INDEPENDENT_AMBULATORY_CARE_PROVIDER_SITE_OTHER): Payer: Medicare Other | Admitting: Pharmacist Clinician (PhC)/ Clinical Pharmacy Specialist

## 2015-12-23 DIAGNOSIS — I359 Nonrheumatic aortic valve disorder, unspecified: Secondary | ICD-10-CM

## 2015-12-23 DIAGNOSIS — Z7901 Long term (current) use of anticoagulants: Secondary | ICD-10-CM | POA: Diagnosis not present

## 2015-12-23 LAB — POCT INR: INR: 2.8

## 2015-12-30 NOTE — Telephone Encounter (Signed)
SPOKE TO WIFE FOR PT TO HOLD COUMADIN THE DAY OF PROCEDURE AND AFTER PROCEDURE WILL BE INSTRUCTED WHEN TO TAKE FOLLOWING DOSE

## 2016-01-11 ENCOUNTER — Ambulatory Visit (INDEPENDENT_AMBULATORY_CARE_PROVIDER_SITE_OTHER): Payer: Medicare Other | Admitting: Pharmacist Clinician (PhC)/ Clinical Pharmacy Specialist

## 2016-01-11 DIAGNOSIS — Z7901 Long term (current) use of anticoagulants: Secondary | ICD-10-CM

## 2016-01-11 DIAGNOSIS — I359 Nonrheumatic aortic valve disorder, unspecified: Secondary | ICD-10-CM

## 2016-01-11 LAB — POCT INR: INR: 2.4

## 2016-01-26 ENCOUNTER — Encounter: Payer: Self-pay | Admitting: Internal Medicine

## 2016-01-26 ENCOUNTER — Ambulatory Visit (INDEPENDENT_AMBULATORY_CARE_PROVIDER_SITE_OTHER): Payer: Medicare Other | Admitting: Internal Medicine

## 2016-01-26 VITALS — BP 158/52 | HR 63 | Ht 72.0 in | Wt 210.5 lb

## 2016-01-26 DIAGNOSIS — I10 Essential (primary) hypertension: Secondary | ICD-10-CM

## 2016-01-26 DIAGNOSIS — I5032 Chronic diastolic (congestive) heart failure: Secondary | ICD-10-CM

## 2016-01-26 DIAGNOSIS — Z950 Presence of cardiac pacemaker: Secondary | ICD-10-CM

## 2016-01-26 DIAGNOSIS — I480 Paroxysmal atrial fibrillation: Secondary | ICD-10-CM

## 2016-01-26 LAB — CUP PACEART INCLINIC DEVICE CHECK
Battery Impedance: 100 Ohm
Brady Statistic AP VP Percent: 0 %
Brady Statistic AP VS Percent: 47 %
Brady Statistic AS VS Percent: 53 %
Implantable Lead Implant Date: 20060220
Implantable Lead Location: 753859
Lead Channel Impedance Value: 624 Ohm
Lead Channel Pacing Threshold Amplitude: 0.625 V
Lead Channel Pacing Threshold Amplitude: 1 V
Lead Channel Pacing Threshold Pulse Width: 0.4 ms
Lead Channel Sensing Intrinsic Amplitude: 11.2 mV
Lead Channel Sensing Intrinsic Amplitude: 4 mV
Lead Channel Setting Pacing Amplitude: 2 V
Lead Channel Setting Pacing Pulse Width: 0.4 ms
Lead Channel Setting Sensing Sensitivity: 2.8 mV
MDC IDC LEAD IMPLANT DT: 20060220
MDC IDC LEAD LOCATION: 753860
MDC IDC MSMT BATTERY REMAINING LONGEVITY: 156 mo
MDC IDC MSMT BATTERY VOLTAGE: 2.8 V
MDC IDC MSMT LEADCHNL RA IMPEDANCE VALUE: 490 Ohm
MDC IDC MSMT LEADCHNL RA PACING THRESHOLD AMPLITUDE: 0.5 V
MDC IDC MSMT LEADCHNL RA PACING THRESHOLD PULSEWIDTH: 0.4 ms
MDC IDC MSMT LEADCHNL RV PACING THRESHOLD AMPLITUDE: 0.75 V
MDC IDC MSMT LEADCHNL RV PACING THRESHOLD PULSEWIDTH: 0.4 ms
MDC IDC MSMT LEADCHNL RV PACING THRESHOLD PULSEWIDTH: 0.4 ms
MDC IDC SESS DTM: 20170131123812
MDC IDC SET LEADCHNL RV PACING AMPLITUDE: 2.5 V
MDC IDC STAT BRADY AS VP PERCENT: 0 %

## 2016-01-26 NOTE — Assessment & Plan Note (Signed)
BP 158/60, will follow, He is trying to maintain a low sodium diet.

## 2016-01-26 NOTE — Assessment & Plan Note (Signed)
His symptoms are class 3. He borders between fluid overload and dehydration. I have asked that he watch his weight and salt intake.

## 2016-01-26 NOTE — Assessment & Plan Note (Signed)
His medtronic DDD PM is working normally. Will recheck in several months. 

## 2016-01-26 NOTE — Patient Instructions (Signed)
Medication Instructions:  Your physician recommends that you continue on your current medications as directed. Please refer to the Current Medication list given to you today.   Labwork: None ordered   Testing/Procedures: None ordered   Follow-Up: Your physician wants you to follow-up in: 9 months with Dr Court Joy will receive a reminder letter in the mail two months in advance. If you don't receive a letter, please call our office to schedule the follow-up appointment.  Remote monitoring is used to monitor your Pacemaker  from home. This monitoring reduces the number of office visits required to check your device to one time per year. It allows [place] to keep an eye on the functioning of your device to ensure it is working properly. You are scheduled for a device check from home on 04/26/16. You may send your transmission at any time that day. If you have a wireless device, the transmission will be sent automatically. After your physician reviews your transmission, you will receive a postcard with your next transmission date.     Any Other Special Instructions Will Be Listed Below (If Applicable).     If you need a refill on your cardiac medications before your next appointment, please call your pharmacy.

## 2016-01-26 NOTE — Assessment & Plan Note (Signed)
He is maintaining NSR very nicely. Will follow. Continue amio.

## 2016-01-26 NOTE — Progress Notes (Signed)
HPI [name] [name] returns today for followup. He is a very pleasant 82 year old man with a history of symptomatic sinus bradycardia secondary to sinus node dysfunction, status post permanent pacemaker insertion with recent gen change, paroxysmal atrial fibrillation and flutter, and hypertension. He has a history of aortic insufficiency and is status post aortic valve replacement. He was recently hospitalized with GI bleeding, secondary to gastritis and duodenitis. He also notices shortness of breath and dizziness.  No Known Allergies   Current Outpatient Prescriptions  Medication Sig Dispense Refill  . alfuzosin (UROXATRAL) 10 MG 24 hr tablet Take 10 mg by mouth daily.     [name] amiodarone (PACERONE) 200 MG tablet Take 1 tablet (200 mg total) by mouth daily. 30 tablet 5  . diltiazem (CARDIZEM CD) 180 MG 24 hr capsule Take 1 capsule (180 mg total) by mouth daily. 90 capsule 3  . finasteride (PROSCAR) 5 MG tablet Take 5 mg by mouth daily.    . furosemide (LASIX) 20 MG tablet TAKE ONE TABLET EVERY OTHER DAY (Patient taking differently: Take 20 mg by mouth daily. ) 45 tablet 3  . furosemide (LASIX) 40 MG tablet Take 1 tablet by mouth daily as needed for fluid or edema.     . Lactobacillus (ACIDOPHILUS PO) Take 1 capsule by mouth 2 (two) times daily.    [name] levothyroxine (SYNTHROID, LEVOTHROID) 100 MCG tablet Take 1 tablet by mouth daily.    [name] omeprazole (PRILOSEC) 20 MG capsule Take 20 mg by mouth daily.    . polyethylene glycol (MIRALAX / GLYCOLAX) packet Take 17 g by mouth daily as needed. Until stooling regularly 30 packet 0  . Prenatal Vit-Fe Fumarate-FA (PNV PRENATAL PLUS MULTIVITAMIN) 27-1 MG TABS Take 1 tablet by mouth daily.    . traMADol (ULTRAM) 50 MG tablet Take 100 mg by mouth 3 (three) times daily.     [name] warfarin (COUMADIN) 4 MG tablet Take 2-4 mg by mouth daily at 6 PM. Take 1 tablet (4 mg total) by mouth daily except for  Wednesday, and Fridays take 0.5 tablet (2 mg total).     No current  facility-administered medications for this visit.     Past Medical History  Diagnosis Date  . Elevated PSA   . Aortic insufficiency   . Thoracic aortic aneurysm (HCC)   . Carpal tunnel syndrome   . SVT (supraventricular tachycardia) (HCC)   . Amaurosis fugax   . BPH (benign prostatic hypertrophy)   . Hypothyroidism   . Congestive heart failure (CHF) (HCC)   . Atrial fibrillation (HCC)   . Presence of permanent cardiac pacemaker     Medtronic, 2006, SSS, bradycardia, Dr. Ladona Ridgel  . Lower GI bleeding ~ 08/2014  . History of blood transfusion ~ 08/2014    related to LGIB  . Anemia   . History of gout   . 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   Past Surgical History  Procedure Laterality Date  . Insert / replace / remove pacemaker      Medtronic  . Median sternotomy    . Total knee arthroplasty  Right   . Carpal tunnel release Right   . Joint replacement    . Inguinal hernia repair Bilateral   . Mechanical aortic valve replacement  01/19/2005    27 mm Carbomedics  . Ascending aortic aneurysm repair w/ mechanical aortic valve replacement  01/19/2005    27 mm Carboseal Valsalva valve conduit  . Ep implantable device N/A 11/25/2015    Procedure:  PPM Generator Changeout/ Battery Only;  Surgeon: Marinus Maw, MD;  Location: [facility];  Service: Cardiovascular;  Laterality: N/A;     Family History  Problem Relation Age of Onset  . Hypertension Mother   . Stroke Father   . Dementia Sister     youngest sister  . Heart disease Brother       Social History   Social History  . Marital Status: Married    Spouse Name: N/A  . Number of Children: 3  . Years of Education: N/A   Occupational History  . retired     Visual merchandiser   Social History Main Topics  . Smoking status: Never Smoker   . Smokeless tobacco: Never Used  . Alcohol Use: No  . Drug Use: No  . Sexual Activity: Not on file   Other Topics Concern  . Not on file   Social History Narrative   Lives with his wife who does the cooking and make sure he takes his medicines appropriately.     Ht 6' (1.829 m)  Wt 210 lb 8 oz (95.482 kg)  BMI 28.54 kg/m2  Physical Exam:  Chronically ill appearing elderly man, NAD HEENT: Unremarkable Neck:  7 cm JVD, no thyromegally Lungs:  Clear with no wheezes, rales, or rhonchi. Well-healed pacemaker incision HEART:  Regular rate rhythm, soft systolic murmur at the base, no rubs, no clicks Abd:  soft, positive bowel sounds, no organomegally, no rebound, no guarding Ext:  2 plus pulses, trace edema, no cyanosis, no clubbing Skin:  No rashes no nodules Neuro:  CN II through XII intact, motor grossly intact  DEVICE  Normal device function.  See PaceArt for details.   Assess/Plan:

## 2016-02-03 NOTE — Progress Notes (Signed)
HPI: FU AVR; hx of aortic insufficiency and ascending thoracic aortic aneurysm s/p Bentall procedure with mechanical AVR in 2006, sinus bradycardia status post permanent pacemaker implantation, PAF, SVT.Patient was seen by Dr. Ladona Ridgel 10/15 with recurrent and increasingly symptomatic atrial fibrillation. He has also been noted to have MAT. Amiodarone initiated. Echocardiogram 10/15 demonstrated normal LV function with an EF of 50-55%. AVR was functioning appropriately. Note had EGD 9/15 showing gastritis, duodenitis, hiatal hernia and stricture. Colonoscopy showed extensive diverticular disease and hemorrhoids. 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 Followed by Dr Cornelius Moras. Since last seen, He notes dyspnea on exertion but no orthopnea, PND, pedal edema, chest pain or syncope. Some dizziness with standing. Complains of weakness.   Studies: - LHC (1/06): Proximal RCA 30%, large thoracic aortic aneurysm (7 cm by CT scan) - Echo (09/2014): Mild concentric hypertrophy. EF 50% to 55%. Wall motion was normal. Grade 1 diastolic dysfunction. Aortic valve: A mechanical prosthesis was present. There was mildregurgitation. Mean gradient (S): 10 mm Hg. Peak gradient (S): 22 mm Hg.Moderate LAE, mild RAE - Carotid US (2/16): No significant stenosis  Current Outpatient Prescriptions  Medication Sig Dispense Refill  . alfuzosin (UROXATRAL) 10 MG 24 hr tablet Take 10 mg by mouth daily.     [name] amiodarone (PACERONE) 200 MG tablet Take 1 tablet (200 mg total) by mouth daily. 30 tablet 5  . diltiazem (CARDIZEM CD) 180 MG 24 hr capsule Take 1 capsule (180 mg total) by mouth daily. 90 capsule 3  . finasteride (PROSCAR) 5 MG tablet Take 5 mg by mouth daily.    . furosemide (LASIX) 20 MG tablet Take 20 mg by mouth daily. Pt also has a 40 mg tab that is taken as needed for shortness of breath or weight gain    . furosemide (LASIX) 40 MG tablet Take 1 tablet by mouth daily as needed for fluid or edema.     .  Lactobacillus (ACIDOPHILUS PO) Take 1 capsule by mouth 2 (two) times daily.    [name] levothyroxine (SYNTHROID, LEVOTHROID) 100 MCG tablet Take 1 tablet by mouth daily.    [name] omeprazole (PRILOSEC) 20 MG capsule Take 20 mg by mouth daily.    . polyethylene glycol (MIRALAX / GLYCOLAX) packet Take 17 g by mouth daily as needed. Until stooling regularly 30 packet 0  . Prenatal Vit-Fe Fumarate-FA (PNV PRENATAL PLUS MULTIVITAMIN) 27-1 MG TABS Take 1 tablet by mouth daily.    . traMADol (ULTRAM) 50 MG tablet Take 100 mg by mouth 3 (three) times daily.     [name] warfarin (COUMADIN) 4 MG tablet Take 2-4 mg by mouth daily at 6 PM. Take 1 tablet (4 mg total) by mouth daily except for  Wednesday, and Fridays take 0.5 tablet (2 mg total).     No current facility-administered medications for this visit.     Past Medical History  Diagnosis Date  . Elevated PSA   . Aortic insufficiency   . Thoracic aortic aneurysm (HCC)   . Carpal tunnel syndrome   . SVT (supraventricular tachycardia) (HCC)   . Amaurosis fugax   . BPH (benign prostatic hypertrophy)   . Hypothyroidism   . Congestive heart failure (CHF) (HCC)   . Atrial fibrillation (HCC)   . Presence of permanent cardiac pacemaker     Medtronic, 2006, SSS, bradycardia, Dr. Ladona Ridgel  . Lower GI bleeding ~ 08/2014  . History of blood transfusion ~ 08/2014    related to LGIB  . Anemia   .  History of gout   . 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 Insert / replace / remove pacemaker      Medtronic  . Median sternotomy    . Total knee arthroplasty Right   .  Carpal tunnel release Right   . Joint replacement    . Inguinal hernia repair Bilateral   . Mechanical aortic valve replacement  01/19/2005    27 mm Carbomedics  . Ascending aortic aneurysm repair w/ mechanical aortic valve replacement  01/19/2005    27 mm Carboseal Valsalva valve conduit  . Ep implantable device N/A 11/25/2015    Procedure:  PPM Generator Changeout/ Battery Only;  Surgeon: Marinus Maw, MD;  Location: [facility];  Service: Cardiovascular;  Laterality: N/A;    Social History   Social History  . Marital Status: Married    Spouse Name: N/A  . Number of Children: 3  . Years of Education: N/A   Occupational History  . retired     Visual merchandiser   Social History Main Topics  . Smoking status: Never Smoker   . Smokeless tobacco: Never Used  . Alcohol Use: No  . Drug Use: No  . Sexual Activity: Not on file   Other Topics Concern  . Not on file   Social History Narrative   Lives with his wife who does the cooking and make sure he takes his medicines appropriately.    Family History  Problem Relation Age of Onset  . Hypertension Mother   . Stroke Father   . Dementia Sister     youngest sister  . Heart disease Brother     ROS: Knee pain but no fevers or chills, productive cough, hemoptysis, dysphasia, odynophagia, melena, hematochezia, dysuria, hematuria, rash, seizure activity, orthopnea, PND, pedal edema, claudication. Remaining systems are negative.  Physical Exam: Well-developed well-nourished in no acute distress.  Skin is warm and dry.  HEENT is normal.  Neck is supple.  Chest is clear to auscultation with normal expansion.  Cardiovascular exam is regular rate and rhythm. Crisp mechanical valve sounds, 2/6 systolic murmur left sternal border. Abdominal exam nontender or distended. No masses palpated. Extremities show no edema. neuro grossly intact

## 2016-02-08 ENCOUNTER — Encounter: Payer: Self-pay | Admitting: Cardiology

## 2016-02-08 ENCOUNTER — Ambulatory Visit (INDEPENDENT_AMBULATORY_CARE_PROVIDER_SITE_OTHER): Payer: Medicare Other | Admitting: Cardiology

## 2016-02-08 ENCOUNTER — Ambulatory Visit (INDEPENDENT_AMBULATORY_CARE_PROVIDER_SITE_OTHER): Payer: Medicare Other | Admitting: Pharmacist Clinician (PhC)/ Clinical Pharmacy Specialist

## 2016-02-08 ENCOUNTER — Ambulatory Visit (HOSPITAL_COMMUNITY)
Admission: RE | Admit: 2016-02-08 | Discharge: 2016-02-08 | Disposition: A | Discharge status: Home / Self Care | Payer: Medicare Other | Source: Ambulatory Visit | Attending: Cardiology | Admitting: Cardiology

## 2016-02-08 VITALS — BP 152/50 | HR 71 | Ht 72.0 in | Wt 206.0 lb

## 2016-02-08 DIAGNOSIS — Z950 Presence of cardiac pacemaker: Secondary | ICD-10-CM

## 2016-02-08 DIAGNOSIS — I480 Paroxysmal atrial fibrillation: Secondary | ICD-10-CM | POA: Diagnosis present

## 2016-02-08 DIAGNOSIS — I517 Cardiomegaly: Secondary | ICD-10-CM | POA: Insufficient documentation

## 2016-02-08 DIAGNOSIS — I10 Essential (primary) hypertension: Secondary | ICD-10-CM

## 2016-02-08 DIAGNOSIS — I351 Nonrheumatic aortic (valve) insufficiency: Secondary | ICD-10-CM | POA: Insufficient documentation

## 2016-02-08 DIAGNOSIS — Z952 Presence of prosthetic heart valve: Secondary | ICD-10-CM | POA: Insufficient documentation

## 2016-02-08 DIAGNOSIS — I719 Aortic aneurysm of unspecified site, without rupture: Secondary | ICD-10-CM

## 2016-02-08 DIAGNOSIS — I712 Thoracic aortic aneurysm, without rupture: Secondary | ICD-10-CM | POA: Insufficient documentation

## 2016-02-08 DIAGNOSIS — Z7901 Long term (current) use of anticoagulants: Secondary | ICD-10-CM | POA: Diagnosis not present

## 2016-02-08 DIAGNOSIS — I5032 Chronic diastolic (congestive) heart failure: Secondary | ICD-10-CM | POA: Diagnosis not present

## 2016-02-08 DIAGNOSIS — I359 Nonrheumatic aortic valve disorder, unspecified: Secondary | ICD-10-CM | POA: Diagnosis not present

## 2016-02-08 LAB — CBC
HCT: 37.6 % — ABNORMAL LOW (ref 39.0–52.0)
Hemoglobin: 12.3 g/dL — ABNORMAL LOW (ref 13.0–17.0)
MCH: 28.7 pg (ref 26.0–34.0)
MCHC: 32.7 g/dL (ref 30.0–36.0)
MCV: 87.6 fL (ref 78.0–100.0)
MPV: 9.2 fL (ref 8.6–12.4)
Platelets: 204 10*3/uL (ref 150–400)
RBC: 4.29 MIL/uL (ref 4.22–5.81)
RDW: 15.5 % (ref 11.5–15.5)
WBC: 8 10*3/uL (ref 4.0–10.5)

## 2016-02-08 LAB — BASIC METABOLIC PANEL
BUN: 42 mg/dL — ABNORMAL HIGH (ref 7–25)
CHLORIDE: 105 mmol/L (ref 98–110)
CO2: 23 mmol/L (ref 20–31)
Calcium: 8.5 mg/dL — ABNORMAL LOW (ref 8.6–10.3)
Creat: 3.45 mg/dL — ABNORMAL HIGH (ref 0.70–1.11)
Glucose, Bld: 116 mg/dL — ABNORMAL HIGH (ref 65–99)
POTASSIUM: 4.4 mmol/L (ref 3.5–5.3)
SODIUM: 139 mmol/L (ref 135–146)

## 2016-02-08 LAB — HEPATIC FUNCTION PANEL
ALT: 15 U/L (ref 9–46)
AST: 23 U/L (ref 10–35)
Albumin: 3.1 g/dL — ABNORMAL LOW (ref 3.6–5.1)
Alkaline Phosphatase: 75 U/L (ref 40–115)
BILIRUBIN INDIRECT: 0.7 mg/dL (ref 0.2–1.2)
Bilirubin, Direct: 0.2 mg/dL (ref <=0.2)
TOTAL PROTEIN: 5.5 g/dL — AB (ref 6.1–8.1)
Total Bilirubin: 0.9 mg/dL (ref 0.2–1.2)

## 2016-02-08 LAB — TSH: TSH: 4.31 m[IU]/L (ref 0.40–4.50)

## 2016-02-08 LAB — POCT INR: INR: 2.3

## 2016-02-08 NOTE — Assessment & Plan Note (Signed)
Plan to continue amiodarone. Check TSH, liver functions and chest x-ray. Check hemoglobin. Continue Coumadin.

## 2016-02-08 NOTE — Assessment & Plan Note (Addendum)
Blood pressure mildly elevated but I am allowing his pressure to run higher as he has had some orthostatic symptoms previously. Continue present medications.

## 2016-02-08 NOTE — Assessment & Plan Note (Signed)
Follow-up Dr. Cornelius Moras for follow-up CTA.

## 2016-02-08 NOTE — Assessment & Plan Note (Signed)
Followed by electrophysiology. 

## 2016-02-08 NOTE — Assessment & Plan Note (Signed)
Continue SBE prophylaxis. 

## 2016-02-08 NOTE — Assessment & Plan Note (Signed)
Continue present dose of Lasix. Check potassium and renal function. 

## 2016-02-08 NOTE — Assessment & Plan Note (Signed)
Continue amiodarone. 

## 2016-02-08 NOTE — Patient Instructions (Signed)
Medication Instructions:   NO CHANGE  Labwork:  Your physician recommends that you HAVE LAB WORK TODAY  Testing/Procedures:  A chest x-ray takes a picture of the organs and structures inside the chest, including the heart, lungs, and blood vessels. This test can show several things, including, whether the heart is enlarges; whether fluid is building up in the lungs; and whether pacemaker / defibrillator leads are still in place. AT  HOSPITAL  Follow-Up:  Your physician wants you to follow-up in: 6 MONTHS WITH DR CRENSHAW You will receive a reminder letter in the mail two months in advance. If you don't receive a letter, please call our office to schedule the follow-up appointment.   If you need a refill on your cardiac medications before your next appointment, please call your pharmacy.    

## 2016-02-10 ENCOUNTER — Telehealth: Payer: Self-pay | Admitting: *Deleted

## 2016-02-10 DIAGNOSIS — N289 Disorder of kidney and ureter, unspecified: Secondary | ICD-10-CM

## 2016-02-10 NOTE — Telephone Encounter (Signed)
-----   Message from Lewayne Bunting, MD sent at 02/09/2016  4:58 AM EST ----- Dc lasix bmet one week, nephrology eval Olga Millers

## 2016-02-10 NOTE — Addendum Note (Signed)
Addended by: Freddi Starr on: 02/10/2016 10:11 AM   Modules accepted: Orders

## 2016-02-10 NOTE — Telephone Encounter (Signed)
Spoke with pt wife, she voiced understanding to hold the furosemide. Lab orders mailed to the pt  Referral placed for neurology.

## 2016-02-11 ENCOUNTER — Telehealth: Payer: Self-pay | Admitting: Cardiology

## 2016-02-11 ENCOUNTER — Other Ambulatory Visit: Payer: Self-pay | Admitting: Thoracic Surgery (Cardiothoracic Vascular Surgery)

## 2016-02-11 DIAGNOSIS — I719 Aortic aneurysm of unspecified site, without rupture: Secondary | ICD-10-CM

## 2016-02-11 NOTE — Telephone Encounter (Signed)
Called patient and spoke with wife regarding appointment for Dr. Terrial Rhodes.  He is scheduled for 02-18-16 at 3:45 and to arrive 30 minutes early.  Calendar mailed to the patient.

## 2016-03-03 ENCOUNTER — Other Ambulatory Visit: Payer: Self-pay | Admitting: Nephrology

## 2016-03-03 DIAGNOSIS — N179 Acute kidney failure, unspecified: Secondary | ICD-10-CM

## 2016-03-03 DIAGNOSIS — N189 Chronic kidney disease, unspecified: Principal | ICD-10-CM

## 2016-03-03 DIAGNOSIS — I509 Heart failure, unspecified: Secondary | ICD-10-CM

## 2016-03-03 DIAGNOSIS — M109 Gout, unspecified: Secondary | ICD-10-CM

## 2016-03-03 DIAGNOSIS — I351 Nonrheumatic aortic (valve) insufficiency: Secondary | ICD-10-CM

## 2016-03-03 DIAGNOSIS — R3129 Other microscopic hematuria: Secondary | ICD-10-CM

## 2016-03-03 DIAGNOSIS — R972 Elevated prostate specific antigen [PSA]: Secondary | ICD-10-CM

## 2016-03-03 DIAGNOSIS — R809 Proteinuria, unspecified: Secondary | ICD-10-CM

## 2016-03-03 DIAGNOSIS — N184 Chronic kidney disease, stage 4 (severe): Secondary | ICD-10-CM

## 2016-03-07 ENCOUNTER — Ambulatory Visit (INDEPENDENT_AMBULATORY_CARE_PROVIDER_SITE_OTHER): Payer: Medicare Other | Admitting: Pharmacist Clinician (PhC)/ Clinical Pharmacy Specialist

## 2016-03-07 DIAGNOSIS — I359 Nonrheumatic aortic valve disorder, unspecified: Secondary | ICD-10-CM

## 2016-03-07 DIAGNOSIS — Z7901 Long term (current) use of anticoagulants: Secondary | ICD-10-CM | POA: Diagnosis not present

## 2016-03-07 LAB — POCT INR: INR: 2.1

## 2016-03-09 ENCOUNTER — Ambulatory Visit
Admission: RE | Admit: 2016-03-09 | Discharge: 2016-03-09 | Disposition: A | Discharge status: Home / Self Care | Payer: Medicare Other | Source: Ambulatory Visit | Attending: Nephrology | Admitting: Nephrology

## 2016-03-09 DIAGNOSIS — I351 Nonrheumatic aortic (valve) insufficiency: Secondary | ICD-10-CM

## 2016-03-09 DIAGNOSIS — R3129 Other microscopic hematuria: Secondary | ICD-10-CM

## 2016-03-09 DIAGNOSIS — M109 Gout, unspecified: Secondary | ICD-10-CM

## 2016-03-09 DIAGNOSIS — N189 Chronic kidney disease, unspecified: Principal | ICD-10-CM

## 2016-03-09 DIAGNOSIS — R972 Elevated prostate specific antigen [PSA]: Secondary | ICD-10-CM

## 2016-03-09 DIAGNOSIS — N179 Acute kidney failure, unspecified: Secondary | ICD-10-CM

## 2016-03-09 DIAGNOSIS — I509 Heart failure, unspecified: Secondary | ICD-10-CM

## 2016-03-09 DIAGNOSIS — R809 Proteinuria, unspecified: Secondary | ICD-10-CM

## 2016-03-09 DIAGNOSIS — N184 Chronic kidney disease, stage 4 (severe): Secondary | ICD-10-CM

## 2016-03-14 ENCOUNTER — Inpatient Hospital Stay: Admission: RE | Admit: 2016-03-14 | Payer: Medicare Other | Source: Ambulatory Visit

## 2016-03-14 ENCOUNTER — Ambulatory Visit: Payer: Medicare Other | Admitting: Thoracic Surgery (Cardiothoracic Vascular Surgery)

## 2016-04-04 ENCOUNTER — Ambulatory Visit (INDEPENDENT_AMBULATORY_CARE_PROVIDER_SITE_OTHER): Payer: Medicare Other | Admitting: Pharmacist Clinician (PhC)/ Clinical Pharmacy Specialist

## 2016-04-04 DIAGNOSIS — Z7901 Long term (current) use of anticoagulants: Secondary | ICD-10-CM | POA: Diagnosis not present

## 2016-04-04 DIAGNOSIS — I359 Nonrheumatic aortic valve disorder, unspecified: Secondary | ICD-10-CM | POA: Diagnosis not present

## 2016-04-04 LAB — POCT INR: INR: 2.1

## 2016-04-21 ENCOUNTER — Other Ambulatory Visit: Payer: Self-pay | Admitting: Cardiology

## 2016-04-21 NOTE — Telephone Encounter (Signed)
REFILL 

## 2016-04-26 ENCOUNTER — Ambulatory Visit (INDEPENDENT_AMBULATORY_CARE_PROVIDER_SITE_OTHER): Payer: Medicare Other | Admitting: *Deleted

## 2016-04-26 DIAGNOSIS — I495 Sick sinus syndrome: Secondary | ICD-10-CM

## 2016-04-26 NOTE — Progress Notes (Signed)
Remote pacemaker transmission.   

## 2016-05-02 ENCOUNTER — Ambulatory Visit (INDEPENDENT_AMBULATORY_CARE_PROVIDER_SITE_OTHER): Payer: Medicare Other | Admitting: Pharmacist Clinician (PhC)/ Clinical Pharmacy Specialist

## 2016-05-02 DIAGNOSIS — I359 Nonrheumatic aortic valve disorder, unspecified: Secondary | ICD-10-CM | POA: Diagnosis not present

## 2016-05-02 DIAGNOSIS — Z7901 Long term (current) use of anticoagulants: Secondary | ICD-10-CM | POA: Diagnosis not present

## 2016-05-02 LAB — POCT INR: INR: 2.1

## 2016-05-16 ENCOUNTER — Ambulatory Visit (INDEPENDENT_AMBULATORY_CARE_PROVIDER_SITE_OTHER): Payer: Medicare Other | Admitting: Pharmacist

## 2016-05-16 DIAGNOSIS — I359 Nonrheumatic aortic valve disorder, unspecified: Secondary | ICD-10-CM | POA: Diagnosis not present

## 2016-05-16 DIAGNOSIS — Z7901 Long term (current) use of anticoagulants: Secondary | ICD-10-CM

## 2016-05-16 LAB — POCT INR: INR: 2

## 2016-05-30 ENCOUNTER — Ambulatory Visit (INDEPENDENT_AMBULATORY_CARE_PROVIDER_SITE_OTHER): Payer: Medicare Other | Admitting: Pharmacist Clinician (PhC)/ Clinical Pharmacy Specialist

## 2016-05-30 DIAGNOSIS — I359 Nonrheumatic aortic valve disorder, unspecified: Secondary | ICD-10-CM | POA: Diagnosis not present

## 2016-05-30 DIAGNOSIS — Z7901 Long term (current) use of anticoagulants: Secondary | ICD-10-CM

## 2016-05-30 LAB — POCT INR: INR: 3.2

## 2016-06-03 ENCOUNTER — Encounter: Payer: Self-pay | Admitting: Cardiology

## 2016-06-06 LAB — CUP PACEART REMOTE DEVICE CHECK
Battery Impedance: 100 Ohm
Brady Statistic AP VP Percent: 0 %
Brady Statistic AP VS Percent: 65 %
Brady Statistic AS VP Percent: 0 %
Brady Statistic AS VS Percent: 35 %
Implantable Lead Implant Date: 20060220
Implantable Lead Location: 753859
Implantable Lead Model: 5076
Lead Channel Impedance Value: 578 Ohm
Lead Channel Pacing Threshold Amplitude: 0.5 V
Lead Channel Pacing Threshold Pulse Width: 0.4 ms
Lead Channel Sensing Intrinsic Amplitude: 2.8 mV
Lead Channel Sensing Intrinsic Amplitude: 5.6 mV
MDC IDC LEAD IMPLANT DT: 20060220
MDC IDC LEAD LOCATION: 753860
MDC IDC MSMT BATTERY REMAINING LONGEVITY: 164 mo
MDC IDC MSMT BATTERY VOLTAGE: 2.8 V
MDC IDC MSMT LEADCHNL RA IMPEDANCE VALUE: 495 Ohm
MDC IDC MSMT LEADCHNL RV PACING THRESHOLD AMPLITUDE: 0.75 V
MDC IDC MSMT LEADCHNL RV PACING THRESHOLD PULSEWIDTH: 0.4 ms
MDC IDC SESS DTM: 20170502132758
MDC IDC SET LEADCHNL RA PACING AMPLITUDE: 2 V
MDC IDC SET LEADCHNL RV PACING AMPLITUDE: 2.5 V
MDC IDC SET LEADCHNL RV PACING PULSEWIDTH: 0.4 ms
MDC IDC SET LEADCHNL RV SENSING SENSITIVITY: 2.8 mV

## 2016-06-20 ENCOUNTER — Ambulatory Visit (INDEPENDENT_AMBULATORY_CARE_PROVIDER_SITE_OTHER): Payer: Medicare Other | Admitting: Pharmacist Clinician (PhC)/ Clinical Pharmacy Specialist

## 2016-06-20 ENCOUNTER — Telehealth: Payer: Self-pay | Admitting: Pharmacist Clinician (PhC)/ Clinical Pharmacy Specialist

## 2016-06-20 DIAGNOSIS — Z7901 Long term (current) use of anticoagulants: Secondary | ICD-10-CM

## 2016-06-20 DIAGNOSIS — I359 Nonrheumatic aortic valve disorder, unspecified: Secondary | ICD-10-CM

## 2016-06-20 LAB — POCT INR: INR: 3.6

## 2016-06-20 NOTE — Telephone Encounter (Signed)
error 

## 2016-06-29 ENCOUNTER — Other Ambulatory Visit: Payer: Self-pay | Admitting: Cardiology

## 2016-07-07 ENCOUNTER — Ambulatory Visit (INDEPENDENT_AMBULATORY_CARE_PROVIDER_SITE_OTHER): Payer: Medicare Other | Admitting: Pharmacist

## 2016-07-07 ENCOUNTER — Other Ambulatory Visit: Payer: Self-pay | Admitting: *Deleted

## 2016-07-07 DIAGNOSIS — I359 Nonrheumatic aortic valve disorder, unspecified: Secondary | ICD-10-CM

## 2016-07-07 DIAGNOSIS — Z7901 Long term (current) use of anticoagulants: Secondary | ICD-10-CM

## 2016-07-07 DIAGNOSIS — I712 Thoracic aortic aneurysm, without rupture, unspecified: Secondary | ICD-10-CM

## 2016-07-07 LAB — POCT INR: INR: 2.9

## 2016-07-14 ENCOUNTER — Telehealth: Payer: Self-pay | Admitting: Cardiology

## 2016-07-14 NOTE — Telephone Encounter (Signed)
New message     FYI Calling to let Dr Jens Somrenshaw that pt cancelled all appts with Dr Arrie Aranoladonato.  Pt felt like he did not need to see kidney doctor.  Per nurse, pt has advanced kidney disease

## 2016-07-18 ENCOUNTER — Other Ambulatory Visit: Payer: Self-pay | Admitting: Cardiology

## 2016-07-18 DIAGNOSIS — I4891 Unspecified atrial fibrillation: Secondary | ICD-10-CM

## 2016-07-26 ENCOUNTER — Ambulatory Visit (INDEPENDENT_AMBULATORY_CARE_PROVIDER_SITE_OTHER): Payer: Medicare Other | Admitting: *Deleted

## 2016-07-26 DIAGNOSIS — Z950 Presence of cardiac pacemaker: Secondary | ICD-10-CM

## 2016-07-26 DIAGNOSIS — I495 Sick sinus syndrome: Secondary | ICD-10-CM

## 2016-07-26 NOTE — Progress Notes (Signed)
Remote pacemaker transmission.   

## 2016-07-28 ENCOUNTER — Ambulatory Visit (INDEPENDENT_AMBULATORY_CARE_PROVIDER_SITE_OTHER): Payer: Medicare Other | Admitting: Pharmacist Clinician (PhC)/ Clinical Pharmacy Specialist

## 2016-07-28 DIAGNOSIS — I359 Nonrheumatic aortic valve disorder, unspecified: Secondary | ICD-10-CM

## 2016-07-28 DIAGNOSIS — Z7901 Long term (current) use of anticoagulants: Secondary | ICD-10-CM | POA: Diagnosis not present

## 2016-07-28 LAB — POCT INR: INR: 3.1

## 2016-08-02 ENCOUNTER — Encounter: Payer: Self-pay | Admitting: Cardiology

## 2016-08-10 LAB — CUP PACEART REMOTE DEVICE CHECK
Battery Impedance: 100 Ohm
Brady Statistic AP VS Percent: 66 %
Brady Statistic AS VP Percent: 0 %
Brady Statistic AS VS Percent: 34 %
Implantable Lead Implant Date: 20060220
Implantable Lead Location: 753859
Implantable Lead Model: 5076
Lead Channel Impedance Value: 542 Ohm
Lead Channel Pacing Threshold Pulse Width: 0.4 ms
Lead Channel Sensing Intrinsic Amplitude: 2.8 mV
Lead Channel Sensing Intrinsic Amplitude: 5.6 mV
Lead Channel Setting Pacing Amplitude: 2 V
Lead Channel Setting Pacing Pulse Width: 0.4 ms
MDC IDC LEAD IMPLANT DT: 20060220
MDC IDC LEAD LOCATION: 753860
MDC IDC MSMT BATTERY REMAINING LONGEVITY: 152 mo
MDC IDC MSMT BATTERY VOLTAGE: 2.8 V
MDC IDC MSMT LEADCHNL RA IMPEDANCE VALUE: 482 Ohm
MDC IDC MSMT LEADCHNL RA PACING THRESHOLD AMPLITUDE: 0.5 V
MDC IDC MSMT LEADCHNL RV PACING THRESHOLD AMPLITUDE: 0.875 V
MDC IDC MSMT LEADCHNL RV PACING THRESHOLD PULSEWIDTH: 0.4 ms
MDC IDC SESS DTM: 20170801131808
MDC IDC SET LEADCHNL RV PACING AMPLITUDE: 2.5 V
MDC IDC SET LEADCHNL RV SENSING SENSITIVITY: 2.8 mV
MDC IDC STAT BRADY AP VP PERCENT: 0 %

## 2016-08-15 ENCOUNTER — Other Ambulatory Visit: Payer: Medicare Other

## 2016-08-15 ENCOUNTER — Ambulatory Visit: Payer: Medicare Other | Admitting: Thoracic Surgery (Cardiothoracic Vascular Surgery)

## 2016-08-18 NOTE — Progress Notes (Signed)
HPI: FU AVR; hx of aortic insufficiency and ascending thoracic aortic aneurysm s/p Bentall procedure with mechanical AVR in 2006, sinus bradycardia status post permanent pacemaker implantation, PAF, SVT.Patient was seen by Dr. Ladona Ridgelaylor 10/15 with recurrent and increasingly symptomatic atrial fibrillation. He has also been noted to have MAT. Amiodarone initiated. Echocardiogram 10/15 demonstrated normal LV function with an EF of 50-55%. AVR was functioning appropriately. Note had EGD 9/15 showing gastritis, duodenitis, hiatal hernia and stricture. Colonoscopy showed extensive diverticular disease and hemorrhoids. 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 Followed by Dr Cornelius Moraswen. Since last seen, Patient describes dyspneaWith activities. No orthopnea, PND, pedal edema or chest pain. He has severe knee pain which limits all activities and his wife feels like his dyspnea is related to inactivity.   Studies: - LHC (1/06): Proximal RCA 30%, large thoracic aortic aneurysm (7 cm by CT scan) - Echo (09/2014): Mild concentric hypertrophy. EF 50% to 55%. Wall motion was normal. Grade 1 diastolic dysfunction. Aortic valve: A mechanical prosthesis was present. There was mildregurgitation. Mean gradient (S): 10 mm Hg. Peak gradient (S): 22 mm Hg.Moderate LAE, mild RAE - Carotid US (2/16): No significant stenosis  Current Outpatient Prescriptions  Medication Sig Dispense Refill  . alfuzosin (UROXATRAL) 10 MG 24 hr tablet Take 10 mg by mouth daily.     [name]. amiodarone (PACERONE) 200 MG tablet TAKE 1 TABLET ONCE DAILY. 30 tablet 0  . diltiazem (CARDIZEM CD) 180 MG 24 hr capsule TAKE 1 CAPSULE BY MOUTH DAILY. 90 capsule 0  . finasteride (PROSCAR) 5 MG tablet Take 5 mg by mouth daily.    . furosemide (LASIX) 20 MG tablet Take 20 mg by mouth daily. Pt also has a 40 mg tab that is taken as needed for shortness of breath or weight gain    . furosemide (LASIX) 40 MG tablet Take 1 tablet by mouth daily as needed for  fluid or edema.     . Lactobacillus (ACIDOPHILUS PO) Take 1 capsule by mouth 2 (two) times daily.    [name]. levothyroxine (SYNTHROID, LEVOTHROID) 100 MCG tablet Take 1 tablet by mouth daily.    [name]. omeprazole (PRILOSEC) 20 MG capsule Take 20 mg by mouth daily.    . polyethylene glycol (MIRALAX / GLYCOLAX) packet Take 17 g by mouth daily as needed. Until stooling regularly 30 packet 0  . Prenatal Vit-Fe Fumarate-FA (PNV PRENATAL PLUS MULTIVITAMIN) 27-1 MG TABS Take 1 tablet by mouth daily.    . traMADol (ULTRAM) 50 MG tablet Take 100 mg by mouth 3 (three) times daily.     [name]. warfarin (COUMADIN) 4 MG tablet Take 2-4 mg by mouth daily at 6 PM. Take 1 tablet (4 mg total) by mouth daily except for  Wednesday, and Fridays take 0.5 tablet (2 mg total).     No current facility-administered medications for this visit.      Past Medical History:  Diagnosis Date  . Amaurosis fugax   . Anemia   . Aortic insufficiency   . Atrial fibrillation (HCC)   . BPH (benign prostatic hypertrophy)   . Carpal tunnel syndrome   . Congestive heart failure (CHF) (HCC)   . Elevated PSA   . History of blood transfusion ~ 08/2014   related to LGIB  . History of gout   . Hypothyroidism   . Lower GI bleeding ~ 08/2014  . Presence of permanent cardiac pacemaker    Medtronic, 2006, SSS, bradycardia, Dr. Ladona Ridgelaylor  . 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 SVT (supraventricular tachycardia) (HCC)   . Thoracic aortic aneurysm [facility])     Past Surgical History:  Procedure Laterality Date  . ASCENDING AORTIC ANEURYSM REPAIR W/ MECHANICAL AORTIC VALVE REPLACEMENT  01/19/2005   27 mm  Carboseal Valsalva valve conduit  . CARPAL TUNNEL RELEASE Right   . EP IMPLANTABLE DEVICE N/A 11/25/2015   Procedure:  PPM Generator Changeout/ Battery Only;  Surgeon: Marinus Maw, MD;  Location: [facility];  Service: Cardiovascular;  Laterality: N/A;  . INGUINAL HERNIA REPAIR Bilateral   . INSERT / REPLACE / REMOVE PACEMAKER     Medtronic  . JOINT REPLACEMENT    . MECHANICAL AORTIC VALVE REPLACEMENT  01/19/2005   27 mm Carbomedics  . MEDIAN STERNOTOMY    . TOTAL KNEE ARTHROPLASTY Right     Social History   Social History  . Marital status: Married    Spouse name: N/A  . Number of children: 3  . Years of education: N/A   Occupational History  . retired Retired    Visual merchandiser   Social History Main Topics  . Smoking status: Never Smoker  . Smokeless tobacco: Never Used  . Alcohol use No  . Drug use: No  . Sexual activity: Not on file   Other Topics Concern  . Not on file   Social History Narrative   Lives with his wife who does the cooking and make sure he takes his medicines appropriately.    Family History  Problem Relation Age of Onset  . Hypertension Mother   . Stroke Father   . Dementia Sister     youngest sister  . Heart disease Brother     ROS: Severe knee arthralgias and shoulder arthralgias but no fevers or chills, productive cough, hemoptysis, dysphasia, odynophagia, melena, hematochezia, dysuria, hematuria, rash, seizure activity, orthopnea, PND, pedal edema, claudication. Remaining systems are negative.  Physical Exam: Well-developed well-nourished in no acute distress.  Skin is warm and dry.  HEENT is normal.  Neck is supple.  Chest is clear to auscultation with normal expansion.  Cardiovascular exam is regular rate and rhythm. 3/6 systolic murmur left sternal border. Abdominal exam nontender or distended. No masses palpated. Extremities show no edema. neuro grossly intact  ECG Atrial paced rhythm, nonspecific ST changes.  1 Status post  AVR-plan to continue SBE prophylaxis and Coumadin. Check echocardiogram given dyspnea.  2Paroxysmal atrial fibrillation-patient remains in sinus rhythm. Continue amiodarone. Check chest x-ray. He had laboratories drawn by his primary care physician recently and will have those forwarded to [place].  3 chronic diastolic congestive heart failure-the patient appears to be euvolemic on examination. Continue present dose of Lasix.  4 hypertension-blood pressure controlled. Continue present medications.  5 status post pacemaker-followed by electrophysiology.  6 pseudoaneurysm of aorta-followed by Dr. Cornelius Moras. He would be a poor surgical candidate.  7 history of SVT-continue amiodarone.  8 dyspnea-etiology unclear. Probable component of inactivity/deconditioning. Not volume overloaded on examination. Check echocardiogram.  Olga Millers, MD

## 2016-08-22 ENCOUNTER — Ambulatory Visit
Admission: RE | Admit: 2016-08-22 | Discharge: 2016-08-22 | Disposition: A | Discharge status: Home / Self Care | Payer: Medicare Other | Source: Ambulatory Visit | Attending: Cardiology | Admitting: Cardiology

## 2016-08-22 ENCOUNTER — Ambulatory Visit (INDEPENDENT_AMBULATORY_CARE_PROVIDER_SITE_OTHER): Payer: Medicare Other | Admitting: Cardiology

## 2016-08-22 ENCOUNTER — Encounter: Payer: Self-pay | Admitting: Cardiology

## 2016-08-22 ENCOUNTER — Ambulatory Visit (INDEPENDENT_AMBULATORY_CARE_PROVIDER_SITE_OTHER): Payer: Medicare Other | Admitting: Pharmacist Clinician (PhC)/ Clinical Pharmacy Specialist

## 2016-08-22 VITALS — BP 128/50 | HR 64 | Ht 72.0 in | Wt 199.0 lb

## 2016-08-22 DIAGNOSIS — Z79899 Other long term (current) drug therapy: Secondary | ICD-10-CM | POA: Diagnosis not present

## 2016-08-22 DIAGNOSIS — Z954 Presence of other heart-valve replacement: Secondary | ICD-10-CM | POA: Diagnosis not present

## 2016-08-22 DIAGNOSIS — I359 Nonrheumatic aortic valve disorder, unspecified: Secondary | ICD-10-CM | POA: Diagnosis not present

## 2016-08-22 DIAGNOSIS — Z952 Presence of prosthetic heart valve: Secondary | ICD-10-CM

## 2016-08-22 DIAGNOSIS — I4891 Unspecified atrial fibrillation: Secondary | ICD-10-CM | POA: Diagnosis not present

## 2016-08-22 DIAGNOSIS — Z7901 Long term (current) use of anticoagulants: Secondary | ICD-10-CM | POA: Diagnosis not present

## 2016-08-22 LAB — POCT INR: INR: 2.9

## 2016-08-22 NOTE — Patient Instructions (Signed)
Medication Instructions:   NO CHANGE  Testing/Procedures: A chest x-ray takes a picture of the organs and structures inside the chest, including the heart, lungs, and blood vessels. This test can show several things, including, whether the heart is enlarges; whether fluid is building up in the lungs; and whether pacemaker / defibrillator leads are still in place.  Your physician has requested that you have an echocardiogram. Echocardiography is a painless test that uses sound waves to create images of your heart. It provides your doctor with information about the size and shape of your heart and how well your heart's chambers and valves are working. This procedure takes approximately one hour. There are no restrictions for this procedure.    Follow-Up: Your physician wants you to follow-up in: 6 MONTHS WITH DR CRENSHAW.  You will receive a reminder letter in the mail two months in advance. If you don't receive a letter, please call our office to schedule the follow-up appointment.  If you need a refill on your cardiac medications before your next appointment, please call your pharmacy.

## 2016-08-23 ENCOUNTER — Ambulatory Visit (HOSPITAL_COMMUNITY): Payer: Medicare Other | Attending: Cardiology

## 2016-08-23 ENCOUNTER — Other Ambulatory Visit: Payer: Self-pay

## 2016-08-23 DIAGNOSIS — Z79899 Other long term (current) drug therapy: Secondary | ICD-10-CM

## 2016-08-23 DIAGNOSIS — Z953 Presence of xenogenic heart valve: Secondary | ICD-10-CM | POA: Insufficient documentation

## 2016-08-23 DIAGNOSIS — I4891 Unspecified atrial fibrillation: Secondary | ICD-10-CM

## 2016-08-23 DIAGNOSIS — I351 Nonrheumatic aortic (valve) insufficiency: Secondary | ICD-10-CM | POA: Diagnosis not present

## 2016-08-23 DIAGNOSIS — Z954 Presence of other heart-valve replacement: Secondary | ICD-10-CM

## 2016-08-23 DIAGNOSIS — I340 Nonrheumatic mitral (valve) insufficiency: Secondary | ICD-10-CM | POA: Diagnosis not present

## 2016-08-23 DIAGNOSIS — I359 Nonrheumatic aortic valve disorder, unspecified: Secondary | ICD-10-CM | POA: Diagnosis present

## 2016-08-23 DIAGNOSIS — I77810 Thoracic aortic ectasia: Secondary | ICD-10-CM | POA: Insufficient documentation

## 2016-08-23 DIAGNOSIS — I517 Cardiomegaly: Secondary | ICD-10-CM | POA: Insufficient documentation

## 2016-08-23 DIAGNOSIS — Z952 Presence of prosthetic heart valve: Secondary | ICD-10-CM

## 2016-08-26 ENCOUNTER — Other Ambulatory Visit: Payer: Self-pay | Admitting: Cardiology

## 2016-09-05 ENCOUNTER — Ambulatory Visit (INDEPENDENT_AMBULATORY_CARE_PROVIDER_SITE_OTHER): Payer: Medicare Other | Admitting: Thoracic Surgery (Cardiothoracic Vascular Surgery)

## 2016-09-05 ENCOUNTER — Encounter: Payer: Self-pay | Admitting: Thoracic Surgery (Cardiothoracic Vascular Surgery)

## 2016-09-05 ENCOUNTER — Ambulatory Visit
Admission: RE | Admit: 2016-09-05 | Discharge: 2016-09-05 | Disposition: A | Discharge status: Home / Self Care | Payer: Medicare Other | Source: Ambulatory Visit | Attending: Thoracic Surgery (Cardiothoracic Vascular Surgery) | Admitting: Thoracic Surgery (Cardiothoracic Vascular Surgery)

## 2016-09-05 ENCOUNTER — Other Ambulatory Visit: Payer: Self-pay | Admitting: Thoracic Surgery (Cardiothoracic Vascular Surgery)

## 2016-09-05 VITALS — BP 146/65 | HR 72 | Resp 16 | Ht 72.0 in | Wt 199.0 lb

## 2016-09-05 DIAGNOSIS — I712 Thoracic aortic aneurysm, without rupture, unspecified: Secondary | ICD-10-CM

## 2016-09-05 DIAGNOSIS — I719 Aortic aneurysm of unspecified site, without rupture: Secondary | ICD-10-CM

## 2016-09-05 DIAGNOSIS — I7121 Aneurysm of the ascending aorta, without rupture: Secondary | ICD-10-CM

## 2016-09-05 DIAGNOSIS — Z952 Presence of prosthetic heart valve: Secondary | ICD-10-CM

## 2016-09-05 DIAGNOSIS — Z954 Presence of other heart-valve replacement: Secondary | ICD-10-CM

## 2016-09-05 NOTE — Progress Notes (Signed)
[address]       [place] [id number]             [phone]     CARDIOTHORACIC SURGERY OFFICE NOTE  Referring Provider is Jens Somrenshaw, Madolyn FriezeBrian S, MD PCP is Abigail MiyamotoPERRY,LAWRENCE EDWARD, MD   HPI:  Patient is an 82 year old male with multiple medical problems including chronic diastolic congestive heart failure, atrial fibrillation, hypertension, chronic kidney disease, and severe degenerative arthritis with extremely limited physical mobility and severe physical deconditioning. He underwent Bentall aortic root replacement with a mechanical valve synthetic graft conduit in 2006. 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 He was last seen here in our office for follow-up on 05/18/2015. Over the past year the patient has continued to gradually go down his physically. He complains of slow progression of exertional shortness of breath and fatigue. He is now quite weak and has trouble to get up and on his feet. He remains severely limited by chronic pain in both knees. He denies resting shortness of breath, PND, or orthopnea. His appetite is poor. Coincident with this he has been noted to have continued gradual decline in his renal function with recent creatinine up to 3.4 when it was last checked on 02/08/2016. He returns to our office today for follow-up related to his small false aneurysm of the aorta and dilated transverse aortic arch. He is accompanied by his wife and daughter. He denies any history of chest pain or chest tightness.   Current Outpatient Prescriptions  Medication Sig Dispense Refill  . alfuzosin (UROXATRAL) 10 MG 24 hr tablet Take 10 mg by mouth daily.     [name]. amiodarone (PACERONE) 200 MG tablet TAKE 1 TABLET ONCE DAILY. 30 tablet 5  . diltiazem (CARDIZEM CD) 180 MG 24 hr capsule TAKE 1  CAPSULE BY MOUTH DAILY. 90 capsule 0  . finasteride (PROSCAR) 5 MG tablet Take 5 mg by mouth daily.    . furosemide (LASIX) 20 MG tablet Take 20 mg by mouth daily. Pt also has a 40 mg tab that is taken as needed for shortness of breath or weight gain    . Lactobacillus (ACIDOPHILUS PO) Take 1 capsule by mouth 2 (two) times daily.    [name]. levothyroxine (SYNTHROID, LEVOTHROID) 100 MCG tablet Take 1 tablet by mouth daily.    . polyethylene glycol (MIRALAX / GLYCOLAX) packet Take 17 g by mouth daily as needed. Until stooling regularly 30 packet 0  . Prenatal Vit-Fe Fumarate-FA (PNV PRENATAL PLUS MULTIVITAMIN) 27-1 MG TABS Take 1 tablet by mouth daily.    . traMADol (ULTRAM) 50 MG tablet Take 100 mg by mouth 3 (three) times daily.     [name]. warfarin (COUMADIN) 4 MG tablet Take 2-4 mg by mouth daily at 6 PM. Take 1 tablet (4 mg total) by mouth daily except for  Wednesday, and Fridays take 0.5 tablet (2 mg total).     No current facility-administered medications for this visit.       Physical Exam:   BP (!) 146/65   Pulse 72   Resp 16   Ht 6' (1.829 m)   Wt 199 lb (90.3 kg)   SpO2 93% Comment: RA  BMI 26.99 kg/m   General:  Chronically debilitated and weak  Chest:   Clear to auscultation  CV:  Irregular rate and rhythm with mechanical heart valve sounds  Incisions:  n/a  Abdomen:  Soft and nontender  Extremities:  Warm and well-perfused, mild lower extremity edema  Diagnostic Tests:  CT CHEST WITHOUT CONTRAST  TECHNIQUE: Multidetector CT imaging of the chest was performed following the standard protocol without IV contrast. Labs at [facility] with BUN 64, creatinine 6.1 and GFR 8.  COMPARISON:  02/16/2015 and 11/28/2014  FINDINGS: Cardiovascular: Cardiac pacer over the upper left chest wall. Mild cardiomegaly unchanged. Evidence of patient's prosthetic aortic valve unchanged. Evidence of patient's ascending thoracic aortic graft placement. Continued evidence of patient's  known aneurysmal dilatation of the proximal aortic arch measuring 4.6 cm in diameter unchanged from 11/28/2014. Previously noted small pseudoaneurysm over the distal aspect of the ascending thoracic aorta is not well visualized. Calcified plaque over the aortic arch in distal thoracic aorta.  Mediastinum/Nodes: Stable 1.2 cm precarinal lymph node. No hilar adenopathy. Remaining mediastinal structures are within normal.  Lungs/Pleura: Lungs are adequately inflated with mild posterior bibasilar atelectasis/ scarring. Possible tiny amount of right pleural fluid. Airways are within normal.  Upper Abdomen: Couple cysts over the liver with the larger over the left lobe measuring 6.5 cm. Mild diverticulosis of the colon.  Musculoskeletal: Mild degenerate change of the spine.  IMPRESSION: No acute cardiopulmonary disease.  Stable aneurysmal dilatation of the proximal aortic arch measuring 4.6 cm. Aortic atherosclerosis. Recommend semi-annual imaging followup by CTA or MRA and referral to cardiothoracic surgery if not already obtained. This recommendation follows 2010 ACCF/AHA/AATS/ACR/ASA/SCA/SCAI/SIR/STS/SVM Guidelines for the Diagnosis and Management of Patients With Thoracic Aortic Disease. Circulation. 2010; 121: e266-e36.  Previous aortic valve replacement and ascending thoracic aortic graft unchanged.  Mild cardiomegaly unchanged. Mild bibasilar atelectasis/scarring. Possible small amount right pleural fluid.  Stable 1.2 cm precarinal lymph node likely reactive.  Liver cysts.   Electronically Signed   By: Elberta Fortis M.D.   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 Assessment of the small false aneurysm cannot be made in the absence of  administration of the venous contrast. This patient would not be considered a candidate for any type of surgical intervention related to his thoracic aorta.    Plan:   I discussed matters at length with the patient, his wife, and his daughter. They all agree that there is no reason to continue to obtain follow-up CT scans to monitor the size of his thoracic aorta or the previously noted false aneurysm. He will continue to follow-up with Dr. Jens Som and Dr. Marina Goodell for long-term attention to his chronic medical problems. In the future he will call and return to see [place] only should specific questions arise.  I spent in excess of 15 minutes during the conduct of this office consultation and >50% of this time involved direct face-to-face encounter with the patient for counseling and/or coordination of their care.   Salvatore Decent. Cornelius Moras, MD 09/05/2016 12:29 PM

## 2016-09-05 NOTE — Patient Instructions (Signed)
Continue all previous medications without any changes at this time  

## 2016-09-19 ENCOUNTER — Ambulatory Visit (INDEPENDENT_AMBULATORY_CARE_PROVIDER_SITE_OTHER): Payer: Medicare Other | Admitting: Pharmacist

## 2016-09-19 DIAGNOSIS — Z7901 Long term (current) use of anticoagulants: Secondary | ICD-10-CM | POA: Diagnosis not present

## 2016-09-19 DIAGNOSIS — I359 Nonrheumatic aortic valve disorder, unspecified: Secondary | ICD-10-CM

## 2016-09-19 LAB — POCT INR: INR: 2.6

## 2016-10-17 ENCOUNTER — Ambulatory Visit (INDEPENDENT_AMBULATORY_CARE_PROVIDER_SITE_OTHER): Payer: Medicare Other | Admitting: Pharmacist

## 2016-10-17 DIAGNOSIS — I359 Nonrheumatic aortic valve disorder, unspecified: Secondary | ICD-10-CM

## 2016-10-17 DIAGNOSIS — Z7901 Long term (current) use of anticoagulants: Secondary | ICD-10-CM

## 2016-10-17 LAB — POCT INR: INR: 4.4

## 2016-10-22 ENCOUNTER — Emergency Department (HOSPITAL_COMMUNITY): Payer: Medicare Other

## 2016-10-22 ENCOUNTER — Inpatient Hospital Stay (HOSPITAL_COMMUNITY)
Admission: EM | Admit: 2016-10-22 | Discharge: 2016-10-25 | DRG: 291 | Disposition: A | Discharge status: Hospice | Payer: Medicare Other | Attending: Internal Medicine | Admitting: Internal Medicine

## 2016-10-22 ENCOUNTER — Encounter (HOSPITAL_COMMUNITY): Payer: Self-pay

## 2016-10-22 DIAGNOSIS — Z7901 Long term (current) use of anticoagulants: Secondary | ICD-10-CM

## 2016-10-22 DIAGNOSIS — Z950 Presence of cardiac pacemaker: Secondary | ICD-10-CM | POA: Diagnosis not present

## 2016-10-22 DIAGNOSIS — R7989 Other specified abnormal findings of blood chemistry: Secondary | ICD-10-CM | POA: Diagnosis present

## 2016-10-22 DIAGNOSIS — Z952 Presence of prosthetic heart valve: Secondary | ICD-10-CM

## 2016-10-22 DIAGNOSIS — Z96651 Presence of right artificial knee joint: Secondary | ICD-10-CM | POA: Diagnosis present

## 2016-10-22 DIAGNOSIS — D649 Anemia, unspecified: Secondary | ICD-10-CM | POA: Diagnosis not present

## 2016-10-22 DIAGNOSIS — I5043 Acute on chronic combined systolic (congestive) and diastolic (congestive) heart failure: Secondary | ICD-10-CM | POA: Diagnosis present

## 2016-10-22 DIAGNOSIS — Z66 Do not resuscitate: Secondary | ICD-10-CM | POA: Diagnosis present

## 2016-10-22 DIAGNOSIS — D500 Iron deficiency anemia secondary to blood loss (chronic): Secondary | ICD-10-CM | POA: Diagnosis present

## 2016-10-22 DIAGNOSIS — I712 Thoracic aortic aneurysm, without rupture: Secondary | ICD-10-CM | POA: Diagnosis present

## 2016-10-22 DIAGNOSIS — E039 Hypothyroidism, unspecified: Secondary | ICD-10-CM | POA: Diagnosis present

## 2016-10-22 DIAGNOSIS — R778 Other specified abnormalities of plasma proteins: Secondary | ICD-10-CM | POA: Diagnosis present

## 2016-10-22 DIAGNOSIS — I132 Hypertensive heart and chronic kidney disease with heart failure and with stage 5 chronic kidney disease, or end stage renal disease: Principal | ICD-10-CM | POA: Diagnosis present

## 2016-10-22 DIAGNOSIS — I248 Other forms of acute ischemic heart disease: Secondary | ICD-10-CM | POA: Diagnosis present

## 2016-10-22 DIAGNOSIS — N185 Chronic kidney disease, stage 5: Secondary | ICD-10-CM | POA: Diagnosis present

## 2016-10-22 DIAGNOSIS — Z79899 Other long term (current) drug therapy: Secondary | ICD-10-CM

## 2016-10-22 DIAGNOSIS — K5791 Diverticulosis of intestine, part unspecified, without perforation or abscess with bleeding: Secondary | ICD-10-CM | POA: Diagnosis not present

## 2016-10-22 DIAGNOSIS — N179 Acute kidney failure, unspecified: Secondary | ICD-10-CM | POA: Diagnosis present

## 2016-10-22 DIAGNOSIS — Z7189 Other specified counseling: Secondary | ICD-10-CM

## 2016-10-22 DIAGNOSIS — R079 Chest pain, unspecified: Secondary | ICD-10-CM

## 2016-10-22 DIAGNOSIS — I480 Paroxysmal atrial fibrillation: Secondary | ICD-10-CM | POA: Diagnosis present

## 2016-10-22 DIAGNOSIS — Z515 Encounter for palliative care: Secondary | ICD-10-CM | POA: Diagnosis not present

## 2016-10-22 DIAGNOSIS — I472 Ventricular tachycardia: Secondary | ICD-10-CM | POA: Diagnosis not present

## 2016-10-22 DIAGNOSIS — E875 Hyperkalemia: Secondary | ICD-10-CM | POA: Diagnosis present

## 2016-10-22 DIAGNOSIS — N189 Chronic kidney disease, unspecified: Secondary | ICD-10-CM

## 2016-10-22 DIAGNOSIS — I7121 Aneurysm of the ascending aorta, without rupture: Secondary | ICD-10-CM | POA: Diagnosis present

## 2016-10-22 DIAGNOSIS — N400 Enlarged prostate without lower urinary tract symptoms: Secondary | ICD-10-CM | POA: Diagnosis present

## 2016-10-22 DIAGNOSIS — R0602 Shortness of breath: Secondary | ICD-10-CM | POA: Diagnosis present

## 2016-10-22 LAB — CBC
HCT: 25 % — ABNORMAL LOW (ref 39.0–52.0)
HEMATOCRIT: 23 % — AB (ref 39.0–52.0)
HEMOGLOBIN: 7.3 g/dL — AB (ref 13.0–17.0)
Hemoglobin: 8 g/dL — ABNORMAL LOW (ref 13.0–17.0)
MCH: 29.7 pg (ref 26.0–34.0)
MCH: 29.3 pg (ref 26.0–34.0)
MCHC: 32 g/dL (ref 30.0–36.0)
MCHC: 31.7 g/dL (ref 30.0–36.0)
MCV: 92.9 fL (ref 78.0–100.0)
MCV: 92.4 fL (ref 78.0–100.0)
PLATELETS: 189 10*3/uL (ref 150–400)
Platelets: 158 10*3/uL (ref 150–400)
RBC: 2.69 MIL/uL — ABNORMAL LOW (ref 4.22–5.81)
RBC: 2.49 MIL/uL — ABNORMAL LOW (ref 4.22–5.81)
RDW: 15.6 % — AB (ref 11.5–15.5)
RDW: 15.5 % (ref 11.5–15.5)
WBC: 2.7 10*3/uL — AB (ref 4.0–10.5)
WBC: 6.4 10*3/uL (ref 4.0–10.5)

## 2016-10-22 LAB — BASIC METABOLIC PANEL
ANION GAP: 10 (ref 5–15)
ANION GAP: 8 (ref 5–15)
BUN: 83 mg/dL — ABNORMAL HIGH (ref 6–20)
BUN: 84 mg/dL — AB (ref 6–20)
CALCIUM: 8.3 mg/dL — AB (ref 8.9–10.3)
CHLORIDE: 110 mmol/L (ref 101–111)
CHLORIDE: 109 mmol/L (ref 101–111)
CO2: 19 mmol/L — AB (ref 22–32)
CO2: 20 mmol/L — AB (ref 22–32)
Calcium: 8 mg/dL — ABNORMAL LOW (ref 8.9–10.3)
Creatinine, Ser: 7.82 mg/dL — ABNORMAL HIGH (ref 0.61–1.24)
Creatinine, Ser: 8.1 mg/dL — ABNORMAL HIGH (ref 0.61–1.24)
GFR calc Af Amer: 6 mL/min — ABNORMAL LOW (ref >60)
GFR calc non Af Amer: 5 mL/min — ABNORMAL LOW (ref >60)
GFR, EST AFRICAN AMERICAN: 6 mL/min — AB (ref >60)
GFR, EST NON AFRICAN AMERICAN: 5 mL/min — AB (ref >60)
GLUCOSE: 114 mg/dL — AB (ref 65–99)
GLUCOSE: 103 mg/dL — AB (ref 65–99)
POTASSIUM: 4.7 mmol/L (ref 3.5–5.1)
Potassium: 5.2 mmol/L — ABNORMAL HIGH (ref 3.5–5.1)
Sodium: 139 mmol/L (ref 135–145)
Sodium: 137 mmol/L (ref 135–145)

## 2016-10-22 LAB — URINE MICROSCOPIC-ADD ON

## 2016-10-22 LAB — TROPONIN I
TROPONIN I: 0.2 ng/mL — AB (ref <0.03)
TROPONIN I: 0.25 ng/mL — AB (ref <0.03)
Troponin I: 0.25 ng/mL (ref <0.03)

## 2016-10-22 LAB — URINALYSIS, ROUTINE W REFLEX MICROSCOPIC
BILIRUBIN URINE: NEGATIVE
GLUCOSE, UA: NEGATIVE mg/dL
KETONES UR: NEGATIVE mg/dL
Leukocytes, UA: NEGATIVE
NITRITE: NEGATIVE
PH: 5 (ref 5.0–8.0)
Protein, ur: 30 mg/dL — AB
SPECIFIC GRAVITY, URINE: 1.009 (ref 1.005–1.030)

## 2016-10-22 LAB — PROTIME-INR
INR: 3.84
Prothrombin Time: 38.8 seconds — ABNORMAL HIGH (ref 11.4–15.2)

## 2016-10-22 LAB — POC OCCULT BLOOD, ED: Fecal Occult Bld: POSITIVE — AB

## 2016-10-22 LAB — BRAIN NATRIURETIC PEPTIDE: B Natriuretic Peptide: 2476.4 pg/mL — ABNORMAL HIGH (ref 0.0–100.0)

## 2016-10-22 LAB — PREPARE RBC (CROSSMATCH)

## 2016-10-22 MED ORDER — ACIDOPHILUS PO CAPS: 1.0000 | ORAL_CAPSULE | Freq: Two times a day (BID) | ORAL | Status: DC

## 2016-10-22 MED ORDER — ASPIRIN 81 MG PO CHEW
324.0000 mg | CHEWABLE_TABLET | Freq: Once | ORAL | Status: AC
  Administered 2016-10-22: 324 mg via ORAL
  Filled 2016-10-22: qty 4

## 2016-10-22 MED ORDER — DILTIAZEM HCL ER COATED BEADS 180 MG PO CP24
180.0000 mg | ORAL_CAPSULE | Freq: Every day | ORAL | Status: DC
  Administered 2016-10-22 – 2016-10-25 (×4): 180 mg via ORAL
  Filled 2016-10-22 (×4): qty 1

## 2016-10-22 MED ORDER — SODIUM CHLORIDE 0.9% FLUSH
3.0000 mL | Freq: Two times a day (BID) | INTRAVENOUS | Status: DC
  Administered 2016-10-22 – 2016-10-24 (×6): 3 mL via INTRAVENOUS

## 2016-10-22 MED ORDER — FUROSEMIDE 10 MG/ML IJ SOLN
80.0000 mg | Freq: Once | INTRAMUSCULAR | Status: AC
  Administered 2016-10-22: 80 mg via INTRAVENOUS
  Filled 2016-10-22: qty 8

## 2016-10-22 MED ORDER — NITROGLYCERIN 0.4 MG SL SUBL
0.4000 mg | SUBLINGUAL_TABLET | SUBLINGUAL | Status: DC | PRN
  Administered 2016-10-22: 0.4 mg via SUBLINGUAL
  Filled 2016-10-22: qty 1

## 2016-10-22 MED ORDER — ALFUZOSIN HCL ER 10 MG PO TB24
10.0000 mg | ORAL_TABLET | Freq: Every day | ORAL | Status: DC
  Administered 2016-10-22 – 2016-10-25 (×4): 10 mg via ORAL
  Filled 2016-10-22 (×5): qty 1

## 2016-10-22 MED ORDER — FINASTERIDE 5 MG PO TABS
5.0000 mg | ORAL_TABLET | Freq: Every day | ORAL | Status: DC
  Administered 2016-10-22 – 2016-10-25 (×4): 5 mg via ORAL
  Filled 2016-10-22 (×4): qty 1

## 2016-10-22 MED ORDER — LEVOTHYROXINE SODIUM 112 MCG PO CAPS: 112.0000 ug | ORAL_CAPSULE | Freq: Every day | ORAL | Status: DC

## 2016-10-22 MED ORDER — SODIUM CHLORIDE 0.9 % IV SOLN: Freq: Once | INTRAVENOUS | Status: DC

## 2016-10-22 MED ORDER — WARFARIN - PHARMACIST DOSING INPATIENT
Freq: Every day | Status: DC
  Administered 2016-10-23: 18:00:00

## 2016-10-22 MED ORDER — PANTOPRAZOLE SODIUM 40 MG PO TBEC
40.0000 mg | DELAYED_RELEASE_TABLET | Freq: Every day | ORAL | Status: DC
  Administered 2016-10-22 – 2016-10-25 (×4): 40 mg via ORAL
  Filled 2016-10-22 (×4): qty 1

## 2016-10-22 MED ORDER — LEVOTHYROXINE SODIUM 112 MCG PO TABS
112.0000 ug | ORAL_TABLET | Freq: Every day | ORAL | Status: DC
  Administered 2016-10-23 – 2016-10-25 (×3): 112 ug via ORAL
  Filled 2016-10-22 (×3): qty 1

## 2016-10-22 MED ORDER — POLYETHYLENE GLYCOL 3350 17 G PO PACK
17.0000 g | PACK | Freq: Every day | ORAL | Status: DC | PRN
  Filled 2016-10-22: qty 1

## 2016-10-22 MED ORDER — ACETAMINOPHEN 325 MG PO TABS
325.0000 mg | ORAL_TABLET | Freq: Four times a day (QID) | ORAL | Status: DC | PRN
  Filled 2016-10-22: qty 1

## 2016-10-22 MED ORDER — AMIODARONE HCL 200 MG PO TABS
200.0000 mg | ORAL_TABLET | Freq: Every day | ORAL | Status: DC
  Administered 2016-10-22 – 2016-10-25 (×4): 200 mg via ORAL
  Filled 2016-10-22 (×4): qty 1

## 2016-10-22 MED ORDER — RISAQUAD PO CAPS
1.0000 | ORAL_CAPSULE | Freq: Two times a day (BID) | ORAL | Status: DC
  Administered 2016-10-23 – 2016-10-25 (×6): 1 via ORAL
  Filled 2016-10-22 (×6): qty 1

## 2016-10-22 NOTE — Progress Notes (Signed)
Pt received from ED, Vitals stable, aware of the safety precautions,  Family members at bed side,prescribed medicines provided, will continue to monitor

## 2016-10-22 NOTE — ED Triage Notes (Signed)
Pt. arived with central chest pain starting last night,  Upon arrival he is chest pain free.  Having pain under his rt. Arm with movement.  Pt. Is sob upon arrival with audible wheezing.  He reports that the symptoms began yesterday.[name]. His wife reports the cough, weakness and sob has been going on for a while.  Skin is p/w/, rt. Hand is swollen. ECG done

## 2016-10-22 NOTE — ED Provider Notes (Signed)
[facility]-EMERGENCY DEPT Provider Note   CSN: [id number] Arrival date & time: 10/22/16  0915     History [id number]  Chief Complaint Chief Complaint  Patient presents with  . Shortness of Breath  . Chest Pain    HPI [name] [name] is a 82 y.o. male.  HPI  82 year old male presents with chest pain and shortness of breath. He states he's been feeling weak for several weeks but this has been worsening. Yesterday he slid down/fell and had to get help getting up. This morning he was having significant shortness of breath and some chest pain so he called EMS. Short of breath started yesterday and is most prominent with any type of minimal exertion or moving. The chest pain started this morning and is mostly right axillary and right arm. Also has a little bit of mid anterior chest pain. States he has cough for a while, especially with eating. Denies a leg swelling. Is currently on Lasix for fluid issues. Has been having left hand swelling for several weeks that is not worse than typical. No abdominal distention.  Past Medical History:  Diagnosis Date  . Amaurosis fugax   . Anemia   . Aortic insufficiency   . Atrial fibrillation (HCC)   . BPH (benign prostatic hypertrophy)   . Carpal tunnel syndrome   . Congestive heart failure (CHF) (HCC)   . Elevated PSA   . History of blood transfusion ~ 08/2014   related to LGIB  . History of gout   . Hypothyroidism   . Lower GI bleeding ~ 08/2014  . Presence of permanent cardiac pacemaker    Medtronic, 2006, SSS, bradycardia, Dr. Ladona Ridgelaylor  . 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 SVT (supraventricular tachycardia)  (HCC)   . Thoracic aortic aneurysm [facility](HCC)     Patient Active Problem List   Diagnosis Date Noted  . Elevated troponin 10/22/2016  . CKD (chronic kidney disease) stage 5, GFR less than 15 ml/min (HCC) 10/22/2016  . Hyperkalemia 10/22/2016  . Thoracic ascending aortic aneurysm (HCC) 12/09/2014  . 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 Acute on chronic diastolic ACC/AHA stage C congestive heart failure (HCC)   . Paroxysmal atrial fibrillation (HCC)   . Other constipation   . Sick sinus syndrome (HCC)   . Acute on chronic combined systolic and diastolic heart failure (HCC)   . Atrial tachycardia, multifocal (HCC)   . Tachycardia   . SOB (shortness of breath) 11/06/2014  . Multifocal atrial tachycardia (HCC) 10/19/2014  . Chronic diastolic congestive heart failure (HCC) 10/17/2014  . Long term current use of anticoagulant therapy - coumadin 07/22/2014  . Syncope 11/07/2013  . Atrial fibrillation (HCC) 10/16/2013  . Preop cardiovascular exam 08/12/2013  . abdomen 11/01/2012  . CARDIOMYOPATHY 09/07/2009  . AORTIC VALVE REPLACEMENT, HX OF 09/07/2009  . Hypothyroidism 09/05/2009  . AMAUROSIS FUGAX 09/05/2009  . MITRAL REGURGITATION, MILD 09/05/2009  . TRICUSPID REGURGITATION, MILD 09/05/2009  . Essential hypertension 09/05/2009  . AORTIC INSUFFICIENCY 09/05/2009  . BRADYCARDIA 09/05/2009  . Congestive heart failure (HCC) 09/05/2009  . VENTRICULAR HYPERTROPHY, LEFT, HX OF 09/05/2009  . Aneurysm of thoracic aorta (HCC) 09/05/2009  . DYSPNEA, HX OF 09/05/2009  .  Personal history of unspecified circulatory disease 09/05/2009  . SUPRAVENTRICULAR TACHYCARDIA, HX OF 09/05/2009  . BENIGN PROSTATIC HYPERTROPHY, HX OF 09/05/2009  . TOTAL KNEE REPLACEMENT, RIGHT, HX OF 09/05/2009  . PACEMAKER-Medtronic 09/05/2009  . PERCUTANEOUS TRANSLUMINAL CORONARY ANGIOPLASTY, HX OF 09/05/2009  . CARPAL TUNNEL RELEASE, RIGHT, HX OF 09/05/2009  . S/P Bentall aortic root replacement with mechanical  valve conduit 01/19/2005    Past Surgical History:  Procedure Laterality Date  . ASCENDING AORTIC ANEURYSM REPAIR W/ MECHANICAL AORTIC VALVE REPLACEMENT  01/19/2005   27 mm Carboseal Valsalva valve conduit  . CARPAL TUNNEL RELEASE Right   . EP IMPLANTABLE DEVICE N/A 11/25/2015   Procedure:  PPM Generator Changeout/ Battery Only;  Surgeon: Marinus MawGregg W Taylor, MD;  Location: [facility] INVASIVE CV LAB;  Service: Cardiovascular;  Laterality: N/A;  . INGUINAL HERNIA REPAIR Bilateral   . INSERT / REPLACE / REMOVE PACEMAKER     Medtronic  . JOINT REPLACEMENT    . MECHANICAL AORTIC VALVE REPLACEMENT  01/19/2005   27 mm Carbomedics  . MEDIAN STERNOTOMY    . TOTAL KNEE ARTHROPLASTY Right        Home Medications    Prior to Admission medications   Medication Sig Start Date End Date Taking? Authorizing Provider  acetaminophen (TYLENOL) 325 MG tablet Take 325 mg by mouth every 6 (six) hours as needed for moderate pain or headache.   Yes Historical Provider, MD  alfuzosin (UROXATRAL) 10 MG 24 hr tablet Take 10 mg by mouth daily.    Yes Historical Provider, MD  amiodarone (PACERONE) 200 MG tablet Take 200 mg by mouth daily.   Yes Historical Provider, MD  diltiazem (CARDIZEM CD) 180 MG 24 hr capsule TAKE 1 CAPSULE BY MOUTH DAILY. Patient taking differently: Take 180 mg by mouth daily 07/18/16  Yes Lewayne BuntingBrian S Crenshaw, MD  finasteride (PROSCAR) 5 MG tablet Take 5 mg by mouth daily.   Yes Historical Provider, MD  furosemide (LASIX) 20 MG tablet Take 20-40 mg by mouth daily as needed for fluid.    Yes Historical Provider, MD  ipratropium (ATROVENT) 0.06 % nasal spray Place 1-2 sprays into both nostrils 2 (two) times daily as needed for rhinitis.  08/31/16  Yes Historical Provider, MD  Lactobacillus (ACIDOPHILUS PO) Take 1 capsule by mouth 2 (two) times daily.   Yes Historical Provider, MD  Levothyroxine Sodium 112 MCG CAPS Take 112 mcg by mouth daily.  04/07/15  Yes Historical Provider, MD  polyethylene glycol  (MIRALAX / GLYCOLAX) packet Take 17 g by mouth daily as needed. Until stooling regularly 11/09/14  Yes Leona SingletonMaria T Thekkekandam, MD  traMADol (ULTRAM) 50 MG tablet Take 100 mg by mouth 3 (three) times daily.  04/18/14  Yes Historical Provider, MD  warfarin (COUMADIN) 4 MG tablet Take 2-4 mg by mouth See admin instructions. Take 2 mg by mouth on Monday and Friday. Take 4 mg by mouth on all other days.   Yes Historical Provider, MD    Family History Family History  Problem Relation Age of Onset  . Hypertension Mother   . Stroke Father   . Dementia Sister     youngest sister  . Heart disease Brother     Social History Social History  Substance Use Topics  . Smoking status: Never Smoker  . Smokeless tobacco: Never Used  . Alcohol use No     Allergies   Review of patient's allergies indicates no known allergies.   Review of Systems Review of Systems  Respiratory: Positive  for cough and shortness of breath.   Cardiovascular: Positive for chest pain. Negative for leg swelling.  Gastrointestinal: Negative for abdominal distention and abdominal pain.  Neurological: Positive for weakness.  All other systems reviewed and are negative.    Physical Exam Updated Vital Signs BP 136/80 (BP Location: Left Arm)   Pulse 71   Temp 98.2 F (36.8 C) (Oral)   Resp 20   Ht 6' (1.829 m)   Wt 209 lb 14.1 oz (95.2 kg)   SpO2 97%   BMI 28.46 kg/m   Physical Exam  Constitutional: He is oriented to person, place, and time. He appears well-developed and well-nourished.  HENT:  Head: Normocephalic and atraumatic.  Right Ear: External ear normal.  Left Ear: External ear normal.  Nose: Nose normal.  Eyes: Right eye exhibits no discharge. Left eye exhibits no discharge.  Neck: Neck supple.  Cardiovascular: Normal rate and regular rhythm.   Murmur heard. Pulmonary/Chest: No accessory muscle usage. Tachypnea noted. No respiratory distress. He has wheezes in the left lower field. He has rales in  the left lower field. He exhibits tenderness (mild - mid axillary line).  Abdominal: Soft. There is no tenderness.  Musculoskeletal: He exhibits no edema.  Leg show no edema. Left hand diffusely swollen, no erythema  Neurological: He is alert and oriented to person, place, and time.  Skin: Skin is warm and dry. He is not diaphoretic.  Nursing note and vitals reviewed.    ED Treatments / Results  Labs (all labs ordered are listed, but only abnormal results are displayed) Labs Reviewed  BASIC METABOLIC PANEL - Abnormal; Notable for the following:       Result Value   Potassium 5.2 (*)    CO2 19 (*)    Glucose, Bld 114 (*)    BUN 83 (*)    Creatinine, Ser 7.82 (*)    Calcium 8.3 (*)    GFR calc non Af Amer 5 (*)    GFR calc Af Amer 6 (*)    All other components within normal limits  PROTIME-INR - Abnormal; Notable for the following:    Prothrombin Time 38.8 (*)    All other components within normal limits  TROPONIN I - Abnormal; Notable for the following:    Troponin I 0.20 (*)    All other components within normal limits  CBC - Abnormal; Notable for the following:    WBC 2.7 (*)    RBC 2.69 (*)    Hemoglobin 8.0 (*)    HCT 25.0 (*)    RDW 15.6 (*)    All other components within normal limits  BRAIN NATRIURETIC PEPTIDE - Abnormal; Notable for the following:    B Natriuretic Peptide 2,476.4 (*)    All other components within normal limits  TROPONIN I - Abnormal; Notable for the following:    Troponin I 0.25 (*)    All other components within normal limits  POC OCCULT BLOOD, ED - Abnormal; Notable for the following:    Fecal Occult Bld POSITIVE (*)    All other components within normal limits  URINALYSIS, ROUTINE W REFLEX MICROSCOPIC (NOT AT [facility])  TROPONIN I  BASIC METABOLIC PANEL  CBC  TROPONIN I  CBC  RENAL FUNCTION PANEL  PROTIME-INR    EKG  EKG Interpretation  Date/Time:  Saturday October 22 2016 09:39:31 EDT Ventricular Rate:  69 PR Interval:    QRS  Duration: 132 QT Interval:  454 QTC Calculation: 487 R Axis:   -  51 Text Interpretation:  Atrial fibrillation Nonspecific IVCD with LAD LVH with secondary repolarization abnormality ST/T changes new compared to 2015 Confirmed by Jovonne Wilton MD, Jaicey Sweaney [phone]) on 10/22/2016 10:01:06 AM       Radiology Dg Chest 2 View  Result Date: 10/22/2016 CLINICAL DATA:  Right axillary chest pain EXAM: CHEST  2 VIEW COMPARISON:  08/22/2016 chest radiograph. FINDINGS: Sternotomy wires appear aligned and intact. Stable configuration of 2 lead left subclavian pacemaker and aortic valve prosthesis. Stable cardiomediastinal silhouette with mild cardiomegaly and aortic atherosclerosis. No pneumothorax. Small bilateral pleural effusions. Borderline mild pulmonary edema. Mild left basilar atelectasis. IMPRESSION: 1. Borderline mild congestive heart failure. 2. Small bilateral pleural effusions and left basilar atelectasis. 3. Aortic atherosclerosis. Electronically Signed   By: Delbert Phenix M.D.   On: 10/22/2016 11:02    Procedures Procedures (including critical care time)  Medications Ordered in ED Medications  acetaminophen (TYLENOL) tablet 325 mg (not administered)  amiodarone (PACERONE) tablet 200 mg (200 mg Oral Given 10/22/16 1403)  diltiazem (CARDIZEM CD) 24 hr capsule 180 mg (180 mg Oral Given 10/22/16 1403)  polyethylene glycol (MIRALAX / GLYCOLAX) packet 17 g (not administered)  alfuzosin (UROXATRAL) 24 hr tablet 10 mg (10 mg Oral Given 10/22/16 1601)  finasteride (PROSCAR) tablet 5 mg (5 mg Oral Given 10/22/16 1403)  sodium chloride flush (NS) 0.9 % injection 3 mL (3 mLs Intravenous Given 10/22/16 1404)  pantoprazole (PROTONIX) EC tablet 40 mg (40 mg Oral Given 10/22/16 1403)  acidophilus (RISAQUAD) capsule 1 capsule (not administered)  levothyroxine (SYNTHROID, LEVOTHROID) tablet 112 mcg (not administered)  Warfarin - Pharmacist Dosing Inpatient (not administered)  aspirin chewable tablet 324 mg (324  mg Oral Given 10/22/16 1000)  furosemide (LASIX) injection 80 mg (80 mg Intravenous Given 10/22/16 1201)     Initial Impression / Assessment and Plan / ED Course  I have reviewed the triage vital signs and the nursing notes.  Pertinent labs & imaging results that were available during my care of the patient were reviewed by me and considered in my medical decision making (see chart for details).  Clinical Course  Comment By Time  Will check labs to eval for ACS, CHF, etc. CP is atypical but has a concerning history.  Pricilla Loveless, MD 10/28 0932  Hgb 8.0. No reported bloody or melanotic stools. Rectal exam shows light brown stool. He reports no change in symptoms but appears to be breathing easier after 1 nitro Pricilla Loveless, MD 10/28 1038  Teaching service to admit. Requests 80 mg IV lasix. Pricilla Loveless, MD 10/28 1134    Patient appears to be somewhat improved, but will need admission for diuresis and renal/cards consult. ECG probably more from strain rather than ACS. Given ASA.  Final Clinical Impressions(s) / ED Diagnoses   Final diagnoses:  Ascending aortic aneurysm (HCC)  Chest pain  Acute renal failure superimposed on chronic kidney disease, unspecified CKD stage, unspecified acute renal failure type [facility])    New Prescriptions Current Discharge Medication List       Pricilla Loveless, MD 10/22/16 1934

## 2016-10-22 NOTE — ED Notes (Signed)
Pt. Just returned from X-ray. Placed back on the monitor sand Vitals

## 2016-10-22 NOTE — H&P (Signed)
Date: 10/22/2016               Patient Name:  [name] [name] MRN: [id number]  DOB: 01/23/1928 Age / Sex: 82 y.o., male   PCP: Abigail MiyamotoLawrence Edward Perry, MD         Medical Service: Internal Medicine Teaching Service         Attending Physician: Dr. Pricilla LovelessScott Goldston, MD    First Contact: Dr. Reymundo Pollarolyn Cristoval Teall Pager: [phone]  Second Contact: Dr. Gwynn BurlyAndrew Wallace  Pager: [phone]       After Hours (After 5p/  First Contact Pager: [phone]  weekends / holidays): Second Contact Pager: [phone]   Chief Complaint: SOB, left arm pain   History of Present Illness: Patient is an 82 yo M with a pmhx of HTN, CHF, Aortic valve replacement, paroxsymal atrial fibrillation with a pacemaker, and a thoracic ascending aortic aneurysm presenting with worsening SOB and left arm pain. Patient says for the past few months his breathing has worsened. He complains of dyspnea on exertion and is now feeling SOB even at rest. Yesterday evening patient reported falling after getting up to use to bathroom in the middle of the night. He went to sit down on the bed but missed, falling to the floor. He denies experiencing chest pain or LOC during that episode. He did not injure himself. In the morning when he woke up he noticed he was having pain down his left arm as well as swelling in his left hand. Per wife, patient has been experiencing intermittent swelling in that hand every morning that improves throughout the day. However, in light of the new arm pain and worsening SOB she decided to bring him in for evaluation. On ROS he denies chest pain, but endorses bilateral shoulder pain that he attributes to his arthritis. He also complaints of suprapubic discomfort. He denies dysuria but has been experiencing episodes of incontinence. His last BM yesterday was normal. He denies blood in his stool or having dark melenotic stools. He also endorses a dry cough over the past few months that is worsening. He often gets coughing spells  when he is trying to eat, but he denies symptoms of dysphagia or odynophagia.   In the ED, vitals were stable (T 97.9, BP 159/98, HR 69, RR 18, and Oxygen 98% on RA). EKG showed some new ST depression in V5 and V6 compared to prior tracing and I-stat troponin was elevated 0.20. CXR revealed mild congestive changes with bilateral lower effusions and BNP was elevated at 2,400. Other labs significant for worsening renal function, creatinine elevated to 7.82 and BUN 83 from 3.45 and 42 respectively eight months ago. CBC showed leukopenia with wbc 2.7 and hgb of 8.0 from 12.3 eight months ago. Rectal exam per ED physician with brown stool, FOBT pending.   Meds:  Current Meds  Medication Sig  . acetaminophen (TYLENOL) 325 MG tablet Take 325 mg by mouth every 6 (six) hours as needed for moderate pain or headache.  . alfuzosin (UROXATRAL) 10 MG 24 hr tablet Take 10 mg by mouth daily.   [name]. amiodarone (PACERONE) 200 MG tablet Take 200 mg by mouth daily.  [name]. diltiazem (CARDIZEM CD) 180 MG 24 hr capsule TAKE 1 CAPSULE BY MOUTH DAILY. (Patient taking differently: Take 180 mg by mouth daily)  . finasteride (PROSCAR) 5 MG tablet Take 5 mg by mouth daily.  . furosemide (LASIX) 20 MG tablet Take 20-40 mg by mouth daily as needed for fluid.   .[name]  ipratropium (ATROVENT) 0.06 % nasal spray Place 1-2 sprays into both nostrils 2 (two) times daily as needed for rhinitis.   . Lactobacillus (ACIDOPHILUS PO) Take 1 capsule by mouth 2 (two) times daily.  . Levothyroxine Sodium 112 MCG CAPS Take 112 mcg by mouth daily.   . polyethylene glycol (MIRALAX / GLYCOLAX) packet Take 17 g by mouth daily as needed. Until stooling regularly  . traMADol (ULTRAM) 50 MG tablet Take 100 mg by mouth 3 (three) times daily.   [name] warfarin (COUMADIN) 4 MG tablet Take 2-4 mg by mouth See admin instructions. Take 2 mg by mouth on Monday and Friday. Take 4 mg by mouth on all other days.    Allergies: Allergies as of 10/22/2016  . (No Known  Allergies)   Past Medical History:  Diagnosis Date  . Amaurosis fugax   . Anemia   . Aortic insufficiency   . Atrial fibrillation (HCC)   . BPH (benign prostatic hypertrophy)   . Carpal tunnel syndrome   . Congestive heart failure (CHF) (HCC)   . Elevated PSA   . History of blood transfusion ~ 08/2014   related to LGIB  . History of gout   . Hypothyroidism   . Lower GI bleeding ~ 08/2014  . Presence of permanent cardiac pacemaker    Medtronic, 2006, SSS, bradycardia, Dr. Ladona Ridgel  . 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 SVT (supraventricular tachycardia) (HCC)   . Thoracic aortic aneurysm (HCC)     Family History:  Family History  Problem Relation Age of Onset  . Hypertension Mother   . Stroke Father   . Dementia Sister     youngest sister  . Heart disease Brother     Social History: Lives at home with his wife. Patient is DNR.   Review of Systems: A complete ROS was negative except as per HPI.    Physical Exam: Blood pressure 155/59, pulse 67, temperature 97.9 F (36.6 C), temperature source Oral, resp. rate 26, height 6' (1.829 m), weight 209 lb (94.8 kg), SpO2 98 %. Constitutional: NAD, appears comfortable Cardiovascular: RRR, Mechanical valve murmur Pulmonary/Chest: Bibasilar crackles, becomes SOB while speaking  Abdominal: Soft, non tender, non distended. +BS.  Extremities: Warm and well perfused. Distal pulses intact. 1+ pitting edema to the mid shins bilaterally.  Neurological: A&Ox3, CN II - XII grossly intact.  Skin: No rashes or erythema  Psychiatric: Normal mood and affect  EKG: Personally reviewed. Atrial  fibrillation with ventricular rate of 66. New ST depression in V5 - V6 compared to prior tracing.   CXR: Personally reviewed. Cardiomegaly with interstitial edema. Bilateral pleural effusions. No focal consolidations.   Assessment & Plan by Problem:  L arm Pain: Likely NSTEMI. Patient denies chest pain but EKG shows new ST depression in V5-V6 and troponin is mildly elevated at 0.2. LHC in 2006 per Dr. Ludwig Clarks progress note on 02/03/2016 that showed proximal RCA 30% stenosis as well as a large thoracic aortic aneurysm. No other known coronary risk factors aside from age. Given his severe decline in renal function patient would not be a candidate for LHC.  -- Palliative care consult  -- Symptom management  -- S/p ASA  324 x 1 -- ASA 81 daily  -- Trend troponins q6 hrs x 3 -- Repeat EKG in AM -- Tele monitoring  -- SL nitro prn for chest pain  -- Pulse ox and supplemental oxygen prn to maintain sats > 92%  Acute on Chronic HFrEF: Last ECHO in 07/2016 with mild to moderately reduced systolic function, EF 40-45%, and diffuse hypokinesis. CXR on admission with congestive changes and BNP elevated > 2,000. Patient takes 20 mg lasix daily at home plus an additional 20 mg prn for swelling and weight gain. His dry weight is 200 -202 lbs. Up today at 209 lbs.  -- IV lasix 80 mg x 1 in ED -- Strict I&Os, daily weights, fluid restrict  -- Daily labs  Acute on Chronic Kidney Disease: Creatinine and BUN elevated 7.82 and 83 from 3.45 and 42 respectively eight months ago. Likely progression of CKD vs acute exacerbation in the setting of volume overload. Patient was evaluated by [facility] a few months ago and told he was not a dialysis candidate; recommended hospice if renal function were to worsen further.  -- Diurese as above  -- Daily BMET -- Avoid nephrotoxic meds   Anemia: Hemodynamically stable. Positive FOBT this admission. Recent colonoscopy in 2015 for lower GI bleed on anticoagulation that  showed severe diverticular disease. Upper endoscopy revealed severe gastritis, duodenitis, hiatal hernia, and distal esophageal stricture s/p empiric dilation. Patient also has a known ascending aortic aneurysm, stable per CT chest in September of this year.   -- PPI  -- Repeat CBC at 6:00 pm  -- Transfuse prn to maintain hgb > 7.0  Anticoagulation: Mechanical AVR. INR 3.84 today. -- Pharmacy consult, appreciate recs  -- Hold warfarin today   Paroxysmal Atrial Fibrillation / MAT / hx of SVT: EKG with a-fib on admission but rates currently well controlled. Patient has a pacemaker. Follows with Dr. Ladona Ridgelaylor.  -- Continue amiodarone 200 mg daily -- Continue diltiazem 180 mg daily   Thoracic Ascending Aortic Aneurysm: Stable per chest CT in September. Has been evaluated by Dr. Barry Dieneswens who felt he is not a good surgical candidate.  -- Patient was scheduled to have repeat CTA recently but was unable to be performed due to worsening renal function. CT wo contrast showed stable aneurysm.   FEN: fluid restrict, replete lytes prn, heart healthy  VTE ppx: Warfarin for AVR  Code Status: DNR  Dispo: Admit patient to Inpatient with expected length of stay greater than 2 midnights.  Signed: Reymundo Pollarolyn Tryce Surratt, MD 10/22/2016, 11:30 AM  Pager: [phone]

## 2016-10-22 NOTE — ED Notes (Signed)
Pt. Has a urinal  And is instructed to call RN , we will be measuring urine

## 2016-10-22 NOTE — Progress Notes (Signed)
ANTICOAGULATION CONSULT NOTE - Initial Consult  Pharmacy Consult for warfarin Indication: mechanical valve  No Known Allergies  Patient Measurements: Height: 6' (182.9 cm) Weight: 209 lb 14.1 oz (95.2 kg) IBW/kg (Calculated) : 77.6  Vital Signs: Temp: 98.2 F (36.8 C) (10/28 1336) Temp Source: Oral (10/28 1336) BP: 136/80 (10/28 1336) Pulse Rate: 71 (10/28 1336)  Labs:  Recent Labs  10/22/16 0946  HGB 8.0*  HCT 25.0*  PLT 189  LABPROT 38.8*  INR 3.84  CREATININE 7.82*  TROPONINI 0.20*    Estimated Creatinine Clearance: 8.1 mL/min (by C-G formula based on SCr of 7.82 mg/dL (H)).   Medical History: Past Medical History:  Diagnosis Date  . Amaurosis fugax   . Anemia   . Aortic insufficiency   . Atrial fibrillation (HCC)   . BPH (benign prostatic hypertrophy)   . Carpal tunnel syndrome   . Congestive heart failure (CHF) (HCC)   . Elevated PSA   . History of blood transfusion ~ 08/2014   related to LGIB  . History of gout   . Hypothyroidism   . Lower GI bleeding ~ 08/2014  . Presence of permanent cardiac pacemaker    Medtronic, 2006, SSS, bradycardia, Dr. Ladona Ridgelaylor  . 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 SVT (supraventricular tachycardia) (HCC)   . Thoracic aortic aneurysm [facility](HCC)    Assessment: 88 yom presented to the hospital with CP and SOB. He is on chronic coumadin for history of mechanical valve. INR is supratherapeutic today at 3.84. Also of note, FOBT is positive but no active bleeding per discussion with MD. H/H is low and platelets are WNL.   Goal of Therapy:  INR 2.5-3.5 Monitor platelets by  anticoagulation protocol: Yes   Plan:  - No warfarin tonight - Daily INR  Winnifred Dufford, Drake Leachachel Lynn 10/22/2016,2:02 PM

## 2016-10-22 NOTE — Progress Notes (Signed)
Hgb 7.3 on repeat CBC.  Examined patient at bedside.  Reports he has been urinating more since receiving lasix. Gets very tired walking short distances like to the bathroom.  Denies melena or hematochezia.  Discussed possible contribution of anemia to his fatigue in addition to heart failure.  Alert, oriented, in no distress. RRR, mechanical valve click Lungs with bibasilar crackles  -Transfuse 1U pRBCs

## 2016-10-23 ENCOUNTER — Other Ambulatory Visit: Payer: Self-pay

## 2016-10-23 LAB — CBC
HEMATOCRIT: 26.6 % — AB (ref 39.0–52.0)
HEMOGLOBIN: 8.5 g/dL — AB (ref 13.0–17.0)
MCH: 28.7 pg (ref 26.0–34.0)
MCHC: 32 g/dL (ref 30.0–36.0)
MCV: 89.9 fL (ref 78.0–100.0)
Platelets: 178 10*3/uL (ref 150–400)
RBC: 2.96 MIL/uL — ABNORMAL LOW (ref 4.22–5.81)
RDW: 15.6 % — ABNORMAL HIGH (ref 11.5–15.5)
WBC: 7.5 10*3/uL (ref 4.0–10.5)

## 2016-10-23 LAB — RENAL FUNCTION PANEL
ALBUMIN: 2.7 g/dL — AB (ref 3.5–5.0)
ANION GAP: 10 (ref 5–15)
BUN: 85 mg/dL — ABNORMAL HIGH (ref 6–20)
CHLORIDE: 108 mmol/L (ref 101–111)
CO2: 17 mmol/L — ABNORMAL LOW (ref 22–32)
Calcium: 7.9 mg/dL — ABNORMAL LOW (ref 8.9–10.3)
Creatinine, Ser: 8.23 mg/dL — ABNORMAL HIGH (ref 0.61–1.24)
GFR, EST AFRICAN AMERICAN: 6 mL/min — AB (ref >60)
GFR, EST NON AFRICAN AMERICAN: 5 mL/min — AB (ref >60)
Glucose, Bld: 118 mg/dL — ABNORMAL HIGH (ref 65–99)
PHOSPHORUS: 4.6 mg/dL (ref 2.5–4.6)
POTASSIUM: 4.5 mmol/L (ref 3.5–5.1)
Sodium: 135 mmol/L (ref 135–145)

## 2016-10-23 LAB — PROTIME-INR
INR: 3.74
PROTHROMBIN TIME: 37.9 s — AB (ref 11.4–15.2)

## 2016-10-23 LAB — ABO/RH: ABO/RH(D): A POS

## 2016-10-23 MED ORDER — WARFARIN SODIUM 1 MG PO TABS
1.0000 mg | ORAL_TABLET | Freq: Once | ORAL | Status: AC
  Administered 2016-10-23: 1 mg via ORAL
  Filled 2016-10-23: qty 1

## 2016-10-23 MED ORDER — FUROSEMIDE 10 MG/ML IJ SOLN
80.0000 mg | Freq: Once | INTRAMUSCULAR | Status: AC
  Administered 2016-10-23: 80 mg via INTRAVENOUS
  Filled 2016-10-23: qty 8

## 2016-10-23 NOTE — Progress Notes (Addendum)
   Subjective: Patient reports that his breathing has improved today. He continues to have good urine output with the lasix. Sitting up eating breakfast this morning.   Objective:  Vital signs in last 24 hours: Vitals:   10/23/16 0052 10/23/16 0229 10/23/16 0308 10/23/16 0620  BP: (!) 139/47 (!) 127/47 (!) 129/51 (!) 126/51  Pulse: 70 62 61 68  Resp: 18 18 18 18   Temp: 98.4 F (36.9 C) 98.4 F (36.9 C) 98.6 F (37 C) 98.1 F (36.7 C)  TempSrc: Oral Oral Oral Oral  SpO2: 94% 96% 96% 96%  Weight:    207 lb 12.8 oz (94.3 kg)  Height:       Physical Exam Constitutional: NAD, appears comfortable Cardiovascular: RRR, Mechanical valve murmur Pulmonary/Chest: Bibasilar crackles, somewhat improved, breathing comfortably on room air Abdominal: Soft, non tender, non distended. +BS.  Extremities: Warm and well perfused. Distal pulses intact. 1+ pitting edema to the mid shins bilaterally.  Neurological: A&Ox3, CN II - XII grossly intact.  Skin: No rashes or erythema  Psychiatric: Normal mood and affect  Assessment/Plan:  Acute on chronic combined systolic and diastolic heart failure: Complicated by progressively worsening renal function, now CKD stage V and not a dialysis candidate. S/p IV lasix 80 mg x 1 yesterday. Patient reports good urine output and says his breathing has improved today. I/Os appear inaccurate today but weight is down 2lbs since yesterday.  -- IV lasix 80 mg again today -- Strict I&Os, daily weights, fluid restrict  -- Daily labs  CKD stage V: Renal function has progressively worsened over the past 8 months (creatine 3.45 -> 7.8 and BUN 42 -> 83). Evaluated by Dr. Lawana Paiolodonado in April of this year and was told he is not a dialysis candidate, recommended hospice if he were to progress to ESRD. Patient is diuresing well with lasix, reports good urine output. Creatinine worse today after diuresis 7.8 -> 8.2 -- Continue diuresis for today, will continue to monitor renal  function. Caution with further diuresis.  -- Daily labs -- Avoid nephrotoxic meds -- Palliative care consult today   Anemia: Currently asymptomatic. Hemoglobin dropped yesterday 7.3 from 8.0 on admission. FOBT was positive in ED. Patient is on warfarin for mechanical AVR. Recent colonoscopy in 2015 showed severe diverticular disease. Upper endoscopy revealed severe gastritis, duodenitis, hiatal hernia, and distal esophageal stricture s/p empiric dilation. Patient also has a known ascending aortic aneurysm, stable per CT chest in September of this year.   -- Continue PPI -- S/p 1 unit pRBCs overnight -- post transfusion labs have not been drawn; will follow up  -- Transfusion prn to maintain hgb > 7.0   Anticoagulation: Mechanical AVR. INR 3.84 on admission.  -- Warfarin per pharmacy, appreciate recs   Paroxysmal Atrial Fibrillation / MAT / hx of SVT: EKG with a-fib on admission but rates continue to be well controlled. Patient has a pacemaker. Follows with Dr. Ladona Ridgelaylor.  -- Continue amiodarone 200 mg daily -- Continue diltiazem 180 mg daily   Thoracic Ascending Aortic Aneurysm: Stable per chest CT in September. Has been evaluated by Dr. Barry Dieneswens who felt he is not a good surgical candidate.   Hypothyroidism: -- Continue home synthroid 112 mcg daily   FEN: fluid restrict, replete lytes prn, heart healthy  VTE ppx: Warfarin for AVR  Code Status: DNR  Dispo: Anticipated discharge pending further diuresis and palliative care recommendations for hospice.   [name] Pollarolyn Benn Tarver, MD 10/23/2016, 10:34 AM Pager: [phone]

## 2016-10-23 NOTE — Progress Notes (Signed)
Patient up to stand with 2 assist to urinate.  Urinated approximately 200 ccs amber urine, very weak, and short of breath.  O2 sat 97% on room air.  Assisted patient up in bed.  Discussed with family palliative care consult that was made put in by primary provider. Wife very concerned because she freely admits she cannot care for patient at home.  Patient very unsteady even with just attempting to stand by bed with 2 assist.

## 2016-10-23 NOTE — Progress Notes (Signed)
ANTICOAGULATION CONSULT NOTE   Pharmacy Consult for warfarin Indication: mechanical valve  No Known Allergies  Patient Measurements: Height: 6' (182.9 cm) Weight: 207 lb 12.8 oz (94.3 kg) IBW/kg (Calculated) : 77.6  Vital Signs: Temp: 98.1 F (36.7 C) (10/29 1215) Temp Source: Oral (10/29 1215) BP: 131/65 (10/29 1215) Pulse Rate: 70 (10/29 1215)  Labs:  Recent Labs  10/22/16 0946 10/22/16 1532 10/22/16 2119 10/23/16 0921  HGB 8.0*  --  7.3* 8.5*  HCT 25.0*  --  23.0* 26.6*  PLT 189  --  158 178  LABPROT 38.8*  --   --  37.9*  INR 3.84  --   --  3.74  CREATININE 7.82*  --  8.10* 8.23*  TROPONINI 0.20* 0.25* 0.25*  --     Estimated Creatinine Clearance: 7.7 mL/min (by C-G formula based on SCr of 8.23 mg/dL (H)).  Assessment: [age] yom presented to the hospital with CP and SOB. He is on chronic coumadin for history of mechanical valve. INR remains supratherapeutic today at 3.74 but down from yesterday. H/H improved from yesterday and platelets remain WNL. Will attempt low-dose warfarin today to prevent INR from drastically dropping from multiple held doses.   Goal of Therapy:  INR 2.5-3.5 Monitor platelets by anticoagulation protocol: Yes   Plan:  - Warfarin 1mg  PO x 1 tonight - Daily INR  Kasiya Burck, Drake Leachachel Lynn 10/23/2016,12:24 PM

## 2016-10-23 NOTE — Progress Notes (Addendum)
Internal Medicine Attending:   I saw and examined the patient. I reviewed the resident's note and I agree with the resident's findings and plan as documented in the resident's note. SOB has improved with IV lasix.  He is still volume overloaded and we will continue IV lasix.  He received 1 units of PRBC last night.  We hope to reach his wife by telephone today to discuss hospice.   ADDENDUM: I was able to speak with [name] [name], wife of [name] [name].  I updated her on his current status, my discussions with [name] and his poor prognosis.  She informed me that he has been functionally declining for months, she is very concerned that she will be unable to assist in his care if he were to be discharged home.  His wishes to limit interventions are consistent with his previous wishes and they have discussed hospice in the past but were never sure of what time was right to enter hospice care.  She voices understanding of his current situation and agrees to meet with hospice to discuss what they may be able to offer.  We will place a referral to hospice.  In the interim we did discuss our current plan which consist of diuresis if possible, limited blood draws, limited imaging, and no invasive interventions.

## 2016-10-24 ENCOUNTER — Encounter (HOSPITAL_COMMUNITY): Payer: Self-pay | Admitting: Nurse Practitioner

## 2016-10-24 ENCOUNTER — Encounter: Payer: Medicare Other | Admitting: Internal Medicine

## 2016-10-24 DIAGNOSIS — I712 Thoracic aortic aneurysm, without rupture: Secondary | ICD-10-CM

## 2016-10-24 DIAGNOSIS — E039 Hypothyroidism, unspecified: Secondary | ICD-10-CM

## 2016-10-24 DIAGNOSIS — Z66 Do not resuscitate: Secondary | ICD-10-CM

## 2016-10-24 DIAGNOSIS — D649 Anemia, unspecified: Secondary | ICD-10-CM

## 2016-10-24 DIAGNOSIS — I5043 Acute on chronic combined systolic (congestive) and diastolic (congestive) heart failure: Secondary | ICD-10-CM

## 2016-10-24 DIAGNOSIS — Z9889 Other specified postprocedural states: Secondary | ICD-10-CM

## 2016-10-24 DIAGNOSIS — Z950 Presence of cardiac pacemaker: Secondary | ICD-10-CM

## 2016-10-24 DIAGNOSIS — Z79899 Other long term (current) drug therapy: Secondary | ICD-10-CM

## 2016-10-24 DIAGNOSIS — Z7901 Long term (current) use of anticoagulants: Secondary | ICD-10-CM

## 2016-10-24 DIAGNOSIS — Z952 Presence of prosthetic heart valve: Secondary | ICD-10-CM

## 2016-10-24 DIAGNOSIS — Z515 Encounter for palliative care: Secondary | ICD-10-CM

## 2016-10-24 DIAGNOSIS — Z7189 Other specified counseling: Secondary | ICD-10-CM

## 2016-10-24 DIAGNOSIS — I480 Paroxysmal atrial fibrillation: Secondary | ICD-10-CM

## 2016-10-24 DIAGNOSIS — Z8679 Personal history of other diseases of the circulatory system: Secondary | ICD-10-CM

## 2016-10-24 DIAGNOSIS — N185 Chronic kidney disease, stage 5: Secondary | ICD-10-CM

## 2016-10-24 LAB — TYPE AND SCREEN
ABO/RH(D): A POS
ANTIBODY SCREEN: NEGATIVE
Unit division: 0

## 2016-10-24 LAB — BASIC METABOLIC PANEL
ANION GAP: 9 (ref 5–15)
BUN: 90 mg/dL — ABNORMAL HIGH (ref 6–20)
CO2: 21 mmol/L — ABNORMAL LOW (ref 22–32)
Calcium: 8.1 mg/dL — ABNORMAL LOW (ref 8.9–10.3)
Chloride: 108 mmol/L (ref 101–111)
Creatinine, Ser: 9.23 mg/dL — ABNORMAL HIGH (ref 0.61–1.24)
GFR calc Af Amer: 5 mL/min — ABNORMAL LOW (ref >60)
GFR, EST NON AFRICAN AMERICAN: 4 mL/min — AB (ref >60)
Glucose, Bld: 109 mg/dL — ABNORMAL HIGH (ref 65–99)
POTASSIUM: 4.4 mmol/L (ref 3.5–5.1)
SODIUM: 138 mmol/L (ref 135–145)

## 2016-10-24 LAB — CBC
HCT: 25.3 % — ABNORMAL LOW (ref 39.0–52.0)
Hemoglobin: 8.4 g/dL — ABNORMAL LOW (ref 13.0–17.0)
MCH: 29.4 pg (ref 26.0–34.0)
MCHC: 33.2 g/dL (ref 30.0–36.0)
MCV: 88.5 fL (ref 78.0–100.0)
PLATELETS: 181 10*3/uL (ref 150–400)
RBC: 2.86 MIL/uL — AB (ref 4.22–5.81)
RDW: 15.6 % — ABNORMAL HIGH (ref 11.5–15.5)
WBC: 7.3 10*3/uL (ref 4.0–10.5)

## 2016-10-24 LAB — PROTIME-INR
INR: 3.75
Prothrombin Time: 38 seconds — ABNORMAL HIGH (ref 11.4–15.2)

## 2016-10-24 MED ORDER — HYPROMELLOSE (GONIOSCOPIC) 2.5 % OP SOLN
1.0000 [drp] | Freq: Three times a day (TID) | OPHTHALMIC | Status: DC | PRN
  Administered 2016-10-24 – 2016-10-25 (×2): 1 [drp] via OPHTHALMIC
  Filled 2016-10-24: qty 15

## 2016-10-24 MED ORDER — MORPHINE SULFATE (CONCENTRATE) 10 MG/0.5ML PO SOLN
2.5000 mg | ORAL | Status: DC | PRN
  Administered 2016-10-25: 2.6 mg via ORAL
  Filled 2016-10-24: qty 0.5

## 2016-10-24 MED ORDER — WARFARIN SODIUM 1 MG PO TABS
1.0000 mg | ORAL_TABLET | Freq: Once | ORAL | Status: AC
  Administered 2016-10-24: 1 mg via ORAL
  Filled 2016-10-24: qty 0.5
  Filled 2016-10-24 (×2): qty 1

## 2016-10-24 MED ORDER — LIDOCAINE 5 % EX PTCH
3.0000 | MEDICATED_PATCH | CUTANEOUS | Status: DC
  Administered 2016-10-24 – 2016-10-25 (×2): 3 via TRANSDERMAL
  Filled 2016-10-24 (×3): qty 3

## 2016-10-24 NOTE — Progress Notes (Signed)
Patient alarmed 5 beats v-tach.  Pt. Asymptomatic and laying in bed.  Dr. Antony ContrasGuilloud made aware. Will continue to monitor.

## 2016-10-24 NOTE — Consult Note (Signed)
   [facility]   10/24/2016  Maryelizabeth KaufmannGattis Phillips 02/03/1928 [id number]  Patient screened for potential [facility] Care Management services for HF and COPD screens. [facility] is [facility] under patient's Medicare plan.  Chart review reveals the patient is an 82 y.o. male  with past medical history of HTN, HF, CKD, aortic valve replacement, pAfib, bradycardia (paced), and AAA admitted on 10/22/2016 with SOB. Workup reveals HF exacerbation with volume overload and worsening kidney disease, now with serum Cr. 9.23 (not a candidate for HD). Palliative consulted for GOC and transition to Hospice per Palliative Care notes.  No [facility] needs indicated as the patient will receive full services with Palliative Care/Hospice. .  For questions contact:   Charlesetta ShanksVictoria Elveta Rape, RN BSN CCM [facility] Liaison  [phone] business mobile phone Toll free office [phone]

## 2016-10-24 NOTE — Care Management Note (Signed)
Case Management Note  Patient Details  Name: [name] [name] MRN: [id number] Date of Birth: 01/21/1928  Action/Plan: CM received referral for Home Hospice choice, spouse/ daughter [facility]. Referral made as requested. Clinical information faxed as requested for arrangements.  Expected Discharge Date:    possibly 10/25/2016              Expected Discharge Plan:  Home w Hospice Care  Discharge planning Services  CM Consult  Choice offered to:  Patient, Spouse, Adult Children    [facility]  Status of Service:  In process, will continue to follow  Reola MosherChandler, Ivoree Felmlee L, RN,MHA,BSN [phone] 10/24/2016, 11:38 AM

## 2016-10-24 NOTE — Progress Notes (Signed)
ANTICOAGULATION CONSULT NOTE   Pharmacy Consult for warfarin Indication: mechanical valve  No Known Allergies  Patient Measurements: Height: 6' (182.9 cm) Weight: 200 lb 6.4 oz (90.9 kg) IBW/kg (Calculated) : 77.6  Vital Signs: Temp: 98.3 F (36.8 C) (10/30 0400) BP: 145/40 (10/30 0400) Pulse Rate: 80 (10/30 0400)  Labs:  Recent Labs  10/22/16 0946 10/22/16 1532 10/22/16 2119 10/23/16 0921 10/24/16 0332  HGB 8.0*  --  7.3* 8.5* 8.4*  HCT 25.0*  --  23.0* 26.6* 25.3*  PLT 189  --  158 178 181  LABPROT 38.8*  --   --  37.9* 38.0*  INR 3.84  --   --  3.74 3.75  CREATININE 7.82*  --  8.10* 8.23* 9.23*  TROPONINI 0.20* 0.25* 0.25*  --   --     Estimated Creatinine Clearance: 6.3 mL/min (by C-G formula based on SCr of 9.23 mg/dL (H)).  Assessment: [age] yom presented to the hospital with CP and SOB. On Coumadin 4mg  daily exc for 2mg  on Mon/Fri for hx mechanical valve (goal INR 2.5-3.5) INR staying up at 3.75 even with lower doses. We should see the INR slowly trend down into therapeutic range. He is also on amiodarone. Hgb low but stable at 8.4, plts wnl. No s/s of bleed.  Goal of Therapy:  INR 2.5-3.5 Monitor platelets by anticoagulation protocol: Yes   Plan:  Give another Coumadin 1mg  PO x 1 tonight Monitor daily INR, CBC, s/s of bleed  Enzo BiNathan Makaylah Oddo, PharmD, BCPS Clinical Pharmacist Pager [phone] 10/24/2016 10:00 AM

## 2016-10-24 NOTE — Progress Notes (Signed)
  Date: 10/24/2016  Patient name: [name] [name]  Medical record number: [id number]  Date of birth: 07/23/1928   I have personally seen this patient and the plan of care was discussed with the house staff. Please see Dr. Jule SerGuilloud's note for complete details. I concur with her findings.    [facility].   Plans for referral to home hospice and morphine PRN for SOB.  Pain control attempt with lidoderm patch.  DNR.  We will assist with transition to home hospice.    [name], MD 10/24/2016, 2:39 PM

## 2016-10-24 NOTE — Progress Notes (Signed)
   Subjective: Patient continues to complain of SOB, feels like his breathing is unchanged from yesterday. Requesting to get out of bed and walk today.   Objective:  Vital signs in last 24 hours: Vitals:   10/23/16 1215 10/23/16 2009 10/24/16 0400 10/24/16 0500  BP: 131/65 (!) 129/51 (!) 145/40   Pulse: 70 60 80   Resp: 20 20    Temp: 98.1 F (36.7 C) 97.6 F (36.4 C) 98.3 F (36.8 C)   TempSrc: Oral Oral    SpO2: 97% 97% 97%   Weight:    200 lb 6.4 oz (90.9 kg)  Height:       Physical Exam Constitutional: NAD, appears comfortable Cardiovascular: RRR, Mechanical valve murmur Pulmonary/Chest: Bibasilar crackles, breathing comfortably on room air Abdominal: Soft, non tender, non distended. +BS.  Extremities: Warm and well perfused. Distal pulses intact. 1+ pitting edema to the mid shins bilaterally.  Neurological: A&Ox3, CN II - XII grossly intact.  Skin: No rashes or erythema  Psychiatric: Normal mood and affect  Assessment/Plan:  Acute on chronic combined systolic and diastolic heart failure: Complicated by progressively worsening renal function, now CKD stage V and not a dialysis candidate. S/p IV lasix 80 mg x 2, once on admission and once yesterday. Patient reports good urine output and but says his breathing is unchanged from yesterday. I/Os net negative 466 cc/24hrs and weight is down 9 lbs since admission.  -- Will hold off on further diuresis at this time given worsening renal function  -- Strict I&Os, daily weights, fluid restrict  -- Daily labs  CKD stage V: Renal function has progressively worsened over the past 8 months (creatine 3.45 -> 7.8 and BUN 42 -> 83). Evaluated by Dr. Lawana Paiolodonado in April of this year and was told he is not a dialysis candidate, recommended hospice if he were to progress to ESRD. Patient is diured well with lasix, reports good urine output. But creatinine is worse now after diuresis 7.8 -> 8.2 -> 9.2. -- Will hold off on further diuresis --  Daily labs -- Avoid nephrotoxic meds -- Palliative care and hospice consult today   Anemia: Currently asymptomatic. Hemoglobin has remained stable since transfusion yesterday, hgb 8.4 today.  FOBT was positive in ED. Patient is on warfarin for mechanical AVR. Recent colonoscopy in 2015 showed severe diverticular disease. Upper endoscopy revealed severe gastritis, duodenitis, hiatal hernia, and distal esophageal stricture s/p empiric dilation. Patient also has a known ascending aortic aneurysm, stable per CT chest in September of this year.  -- Continue PPI -- S/p 1 unit pRBCs yesterday -- Transfusion prn to maintain hgb > 7.0   Anticoagulation: Mechanical AVR. INR 3.84 on admission. Warfarin held on admission, he received 1mg  yesterday and INR is 3.75 today. -- Warfarin per pharmacy, appreciate recs   Paroxysmal Atrial Fibrillation / MAT / hx of SVT: EKG with a-fib on admission but rates continue to be well controlled. Patient has a pacemaker. Follows with Dr. Ladona Ridgelaylor.  -- Continue amiodarone 200 mg daily -- Continue diltiazem 180 mg daily   Thoracic Ascending Aortic Aneurysm: Stable per chest CT in September. Has been evaluated by Dr. Barry Dieneswens who felt he is not a good surgical candidate.   Hypothyroidism: -- Continue home synthroid 112 mcg daily   FEN: fluid restrict, replete lytes prn, heart healthy  VTE ppx: Warfarin for AVR  Code Status: DNR  Dispo: Anticipated discharge pending palliative care and hospice consult recommendations.  [name] Pollarolyn Joshus Rogan, MD 10/24/2016, 7:53 AM Pager: [phone]

## 2016-10-24 NOTE — Consult Note (Signed)
Consultation Note Date: 10/24/2016   Patient Name: [name] [name]  DOB: 05/28/1928  MRN: [id number]  Age / Sex: 82 y.o., male  PCP: Abigail MiyamotoLawrence Edward Perry, MD Referring Physician: Gust RungErik C Hoffman, DO  Reason for Consultation: Disposition and Establishing goals of care  HPI/Patient Profile: 82 y.o. male  with past medical history of HTN, CHF, CKD, aortic valve replacement, pAfib, bradycardia (paced), and AAA admitted on 10/22/2016 with SOB. Workup reveals CHF exacerbation with volume overload and worsening kidney disease, now with serum Cr. 9.23 (not a candidate for HD). Palliative consulted for GOC and transition to Hospice.   Clinical Assessment and Goals of Care: Patient and family are clear in that they understand patient's poor prognosis and the fact that this is an end of life situation. They have goals of patient returning home and maintaining comfort. They have had previous experience with [facility] due to their son dying. Discussed palliative interventions including use of morphine for SOB. Their goal is to keep patient comfortable and limit sedation if possible, maintain mental status as long as possible. Discussed that morphine will only be used if patient or family requests for SOB comfort. Patient's main complaints are SOB with any exertion, even moving in bed. Pain in shoulders and knees. Pain is chronic, and achy. Nothing relieves pain- they have tried several creams and rubs. Discussed trial of lidoderm patches. Patient still eating and drinking, but has decreased appetite. He enjoys his wife cooking for him.  Primary Decision Maker: PATIENT, spouse and daughter    SUMMARY OF RECOMMENDATIONS -Referral for home with Hospice -Morphine intensol 2.5mg  q 1 hr prn for SOB -lidoderm patches to bil shoulders and L knee -Durable DNR to chart   Code Status/[facility]  Planning:  DNR    Symptom Management:  See above recommendations  Palliative Prophylaxis:   Delirium Protocol and Frequent Pain Assessment  Additional Recommendations (Limitations, Scope, Preferences):  Avoid Hospitalization, Full Comfort Care, Minimize Medications, No Artificial Feeding, No Blood Transfusions, No Chemotherapy, No Diagnostics, No Glucose Monitoring, No Hemodialysis, No IV Antibiotics, No IV Fluids, No Lab Draws, No Radiation, No Surgical Procedures and No Tracheostomy  Psycho-social/Spiritual:   Desire for further Chaplaincy support:No  Additional Recommendations: Education on Hospice  Prognosis:   < 4 weeks d/t rapidly declining kidney function, CHF, transition to comfort measures  Discharge Planning: Home with Hospice      Primary Diagnoses: Present on Admission: . Acute on chronic combined systolic and diastolic heart failure (HCC) . Thoracic ascending aortic aneurysm (HCC) . Paroxysmal atrial fibrillation (HCC) . Elevated troponin . Hyperkalemia   I have reviewed the medical record, interviewed the patient and family, and examined the patient. The following aspects are pertinent.  Past Medical History:  Diagnosis Date  . Amaurosis fugax   . Anemia   . Aortic insufficiency   . Atrial fibrillation (HCC)   . BPH (benign prostatic hypertrophy)   . Carpal tunnel syndrome   . Congestive heart failure (CHF) (HCC)   . Elevated PSA   .  History of blood transfusion ~ 08/2014   related to LGIB  . History of gout   . Hypothyroidism   . Lower GI bleeding ~ 08/2014  . Presence of permanent cardiac pacemaker    [employer], 2006, SSS, bradycardia, Dr. Ladona Ridgel  . 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 SVT (supraventricular tachycardia) (HCC)   . Thoracic aortic aneurysm [facility])    Social History   Social History  . Marital status: Married    Spouse name: N/A  . Number of children: 3  . Years of education: N/A   Occupational History  . retired Retired    Visual merchandiser   Social History Main Topics  . Smoking status: Never Smoker  . Smokeless tobacco: Never Used  . Alcohol use No  . Drug use: No  . Sexual activity: Not Asked   Other Topics Concern  . None   Social History Narrative   Lives with his wife who does the cooking and make sure he takes his medicines appropriately.   Family History  Problem Relation Age of Onset  . Hypertension Mother   . Stroke Father   . Dementia Sister     youngest sister  . Heart disease Brother    Scheduled Meds: . sodium chloride   Intravenous Once  . acidophilus  1 capsule Oral BID  . alfuzosin  10 mg Oral Daily  . amiodarone  200 mg Oral Daily  . diltiazem  180 mg Oral Daily  . finasteride  5 mg Oral Daily  . levothyroxine  112 mcg Oral QAC breakfast  . lidocaine  3 patch Transdermal Q24H  . pantoprazole  40 mg Oral Daily  . sodium chloride flush  3 mL Intravenous Q12H  . warfarin  1 mg Oral ONCE-1800  . Warfarin - Pharmacist Dosing Inpatient   Does not apply q1800   Continuous Infusions:  PRN Meds:.acetaminophen, morphine CONCENTRATE, polyethylene glycol Medications Prior to Admission:  Prior to Admission medications   Medication Sig Start Date End Date Taking? Authorizing Provider  acetaminophen (TYLENOL) 325 MG tablet Take 325 mg by mouth every 6 (six) hours as needed for moderate pain or headache.   Yes Historical Provider, MD  alfuzosin (UROXATRAL) 10 MG 24 hr tablet Take 10 mg by mouth daily.    Yes Historical Provider, MD  amiodarone (PACERONE) 200 MG tablet Take 200 mg by mouth daily.   Yes Historical Provider, MD  diltiazem  (CARDIZEM CD) 180 MG 24 hr capsule TAKE 1 CAPSULE BY MOUTH DAILY. Patient taking differently: Take 180 mg by mouth daily 07/18/16  Yes Lewayne Bunting, MD  finasteride (PROSCAR) 5 MG tablet Take 5 mg by mouth daily.   Yes Historical Provider, MD  furosemide (LASIX) 20 MG tablet Take 20-40 mg by mouth daily as needed for fluid.    Yes Historical Provider, MD  ipratropium (ATROVENT) 0.06 % nasal spray Place 1-2 sprays into both nostrils 2 (two) times daily as needed for rhinitis.  08/31/16  Yes Historical Provider, MD  Lactobacillus (ACIDOPHILUS PO) Take 1 capsule by mouth 2 (two) times daily.   Yes Historical Provider, MD  Levothyroxine Sodium 112 MCG CAPS Take 112 mcg by mouth daily.  04/07/15  Yes Historical Provider, MD  polyethylene glycol (MIRALAX / GLYCOLAX) packet Take 17 g by mouth daily as needed. Until stooling regularly 11/09/14  Yes Leona SingletonMaria T Thekkekandam, MD  traMADol (ULTRAM) 50 MG tablet Take 100 mg by mouth 3 (three) times daily.  04/18/14  Yes Historical Provider, MD  warfarin (COUMADIN) 4 MG tablet Take 2-4 mg by mouth See admin instructions. Take 2 mg by mouth on Monday and Friday. Take 4 mg by mouth on all other days.   Yes Historical Provider, MD   No Known Allergies Review of Systems  Constitutional: Positive for activity change and fatigue.  HENT: Negative.   Eyes: Negative.   Respiratory: Positive for shortness of breath and wheezing.   Gastrointestinal: Positive for abdominal distention.  Endocrine: Negative.   Genitourinary: Negative.   Musculoskeletal: Positive for arthralgias, joint swelling and myalgias.  Skin: Negative.   Allergic/Immunologic: Negative.   Neurological: Negative.   Hematological: Negative.   Psychiatric/Behavioral: Negative.     Physical Exam  Constitutional: He appears well-developed and well-nourished. No distress.  HENT:  Head: Normocephalic and atraumatic.  Eyes: No scleral icterus.  Neck: Normal range of motion.  Cardiovascular: Intact  distal pulses.   Murmur heard. irregular  Pulmonary/Chest: He has no wheezes.  Labored with activity  Musculoskeletal: He exhibits edema.  Generalized weakness  Neurological: He is alert.  Skin: Skin is warm and dry.  Psychiatric: He has a normal mood and affect. His behavior is normal. Thought content normal.    Vital Signs: BP (!) 145/40 (BP Location: Right Arm)   Pulse 80   Temp 98.3 F (36.8 C)   Resp 20   Ht 6' (1.829 m)   Wt 90.9 kg (200 lb 6.4 oz)   SpO2 97%   BMI 27.18 kg/m  Pain Assessment: No/denies pain       SpO2: SpO2: 97 % O2 Device:SpO2: 97 % O2 Flow Rate: .O2 Flow Rate (L/min): 0 L/min  IO: Intake/output summary:  Intake/Output Summary (Last 24 hours) at 10/24/16 1022 Last data filed at 10/24/16 0954  Gross per 24 hour  Intake             1233 ml  Output             1776 ml  Net             -543 ml    LBM: Last BM Date: 10/23/16 Baseline Weight: Weight: 94.8 kg (209 lb) Most recent weight: Weight: 90.9 kg (200 lb 6.4 oz)     Palliative Assessment/Data: PPS: 40%     Time In:0930 Time Out: 1030  Time Total: 60 minutes Greater than 50%  of this time was spent counseling and coordinating care related to the above assessment and plan.  Signed by: Ocie BobKasie Kealii Thueson, AGNP-C Palliative Medicine    Please contact Palliative Medicine Team phone at [phone] for questions and concerns.  For individual provider: See Loretha StaplerAmion

## 2016-10-25 MED ORDER — LIDOCAINE 5 % EX PTCH: 3.0000 | MEDICATED_PATCH | CUTANEOUS | 0 refills | Status: AC

## 2016-10-25 MED ORDER — WARFARIN SODIUM 2 MG PO TABS: 2.0000 mg | ORAL_TABLET | Freq: Every day | ORAL | 1 refills | Status: AC

## 2016-10-25 NOTE — Progress Notes (Signed)
  Date: 10/25/2016  Patient name: [name] [name]  Medical record number: [id number]  Date of birth: 12/31/1927   I have personally seen and evaluated [name] and the plan of care was discussed with the house staff. Please see Dr. Jule SerGuilloud's note for complete details. I concur with her findings with the following additions/corrections:   Discharge today with home hospice.   Inez CatalinaEmily B Mullen, MD 10/25/2016, 1:14 PM

## 2016-10-25 NOTE — Evaluation (Signed)
Physical Therapy Evaluation Patient Details Name: [name] [name] MRN: [id number] DOB: 11/26/1928 Today's Date: 10/25/2016   History of Present Illness  82 y.o. male admitted with acute on chronic systolic and diastolic heart failure. PMH of a fib, pacemaker, aortic valve replacement, BPH, HTN, CKD. Pt to DC home with Hospice. Per family, pt has "days to weeks" of life remaining 2* renal failure.   Clinical Impression  Pt admitted with above diagnosis. Pt currently with functional limitations due to the deficits listed below (see PT Problem List). MOD assist for supine to sit and sit to stand, pt ambulated 8' with RW, distance limited by dyspnea, SaO2 97% on RA.  Pt will benefit from skilled PT to increase their independence and safety with mobility to allow discharge to the venue listed below.       Follow Up Recommendations Home health PT (for home safety eval, home exercise program, to maximize independence with mobility)    Equipment Recommendations  None recommended by PT    Recommendations for Other Services       Precautions / Restrictions Precautions Precautions: Fall Precaution Comments: reports 1 fall in past year Restrictions Weight Bearing Restrictions: No      Mobility  Bed Mobility Overal bed mobility: Needs Assistance Bed Mobility: Supine to Sit     Supine to sit: Mod assist;HOB elevated     General bed mobility comments: assist to raise trunk and pivot to EOB with pad  Transfers Overall transfer level: Needs assistance Equipment used: Rolling walker (2 wheeled) Transfers: Sit to/from Stand Sit to Stand: Mod assist;From elevated surface         General transfer comment: mod A to rise, VCs hand placement  Ambulation/Gait Ambulation/Gait assistance: Min guard Ambulation Distance (Feet): 8 Feet Assistive device: Rolling walker (2 wheeled) Gait Pattern/deviations: Step-through pattern;Decreased step length - right;Decreased step length - left   Gait  velocity interpretation: Below normal speed for age/gender General Gait Details: distance limited by 3/4 dyspnea, SaO2 97% on RA with activity, HR 80s  Stairs            Wheelchair Mobility    Modified Rankin (Stroke Patients Only)       Balance Overall balance assessment: History of Falls;Needs assistance   Sitting balance-Leahy Scale: Good       Standing balance-Leahy Scale: Poor                               Pertinent Vitals/Pain Pain Assessment: No/denies pain    Home Living Family/patient expects to be discharged to:: Private residence Living Arrangements: Spouse/significant other Available Help at Discharge: Family;Available 24 hours/day Type of Home: House Home Access: Stairs to enter Entrance Stairs-Rails: None Entrance Stairs-Number of Steps: 2 Home Layout: One level Home Equipment: Walker - 4 wheels;Cane - single point;Shower seat;Toilet riser;Hospital bed;Wheelchair - manual Additional [facility]    Prior Function Level of Independence: Needs assistance   Gait / Transfers Assistance Needed: uses cane or RW  ADL's / Homemaking Assistance Needed: assist with bathing/dressing        Hand Dominance   Dominant Hand: Right    Extremity/Trunk Assessment   Upper Extremity Assessment: Generalized weakness (crepitus noted B shoulders)           Lower Extremity Assessment: Overall WFL for tasks assessed (+4/5 knee ext B)      Cervical / Trunk Assessment: Kyphotic (forward head)  Communication   Communication: No difficulties  Cognition Arousal/Alertness: Awake/alert Behavior During Therapy: WFL for tasks assessed/performed Overall Cognitive Status: Within Functional Limits for tasks assessed                      General Comments      Exercises     Assessment/Plan    PT Assessment Patient needs continued PT services  PT Problem List Decreased activity  tolerance;Decreased balance;Decreased mobility;Cardiopulmonary status limiting activity          PT Treatment Interventions Gait training;Functional mobility training;Stair training;Therapeutic exercise;Therapeutic activities;Patient/family education    PT Goals (Current goals can be found in the Care Plan section)  Acute Rehab PT Goals Patient Stated Goal: home with hospice PT Goal Formulation: With patient/family Time For Goal Achievement: 11/08/16 Potential to Achieve Goals: Fair    Frequency Min 3X/week   Barriers to discharge        Co-evaluation               End of Session Equipment Utilized During Treatment: Gait belt Activity Tolerance: Patient limited by fatigue Patient left: in chair;with call bell/phone within reach;with family/visitor present Nurse Communication: Mobility status         Time: [id number] PT Time Calculation (min) (ACUTE ONLY): 24 min   Charges:   PT Evaluation $PT Eval Low Complexity: 1 Procedure PT Treatments $Gait Training: 8-22 mins   PT G Codes:        Tamala SerUhlenberg, [name] [name] 10/25/2016, 10:21 AM [phone]

## 2016-10-25 NOTE — Progress Notes (Signed)
Awaiting for DME to be delivered to the patient's home prior to discharge. [facility] ambulance [phone]( [phone]) telephone number [facility] to call to schedule pick up for transportation home. Abelino DerrickB Donnell Beauchamp [facility] Medical CenterRN,MHA,BSN [phone]

## 2016-10-25 NOTE — Progress Notes (Signed)
   Subjective: Patient says he feels tired today, breathing is unchanged from yesterday. Otherwise no complaints.   Objective:  Vital signs in last 24 hours: Vitals:   10/24/16 0500 10/24/16 1133 10/24/16 2025 10/25/16 0449  BP:  (!) 119/47 (!) 123/51 (!) 131/49  Pulse:  81 81 79  Resp:  18 18 18   Temp:  97.7 F (36.5 C) 98.2 F (36.8 C) 98.8 F (37.1 C)  TempSrc:  Oral Oral Oral  SpO2:  98% 97% 96%  Weight: 200 lb 6.4 oz (90.9 kg)   200 lb 9.9 oz (91 kg)  Height:       Physical Exam Constitutional: NAD, appears comfortable Cardiovascular:RRR, Mechanical valve murmur Pulmonary/Chest:Bibasilar crackles, breathing comfortably on room air Abdominal:Soft, non tender, non distended. +BS.  Extremities: Warm and well perfused. Distal pulses intact. 1+ pitting edema to the mid shins bilaterally.  Neurological:A&Ox3, CN II - XII grossly intact.  Skin: No rashes or erythema  Psychiatric:Normal mood and affect  Assessment/Plan:  Acute on chronic combined systolic and diastolic heart failure:Complicated by progressively worsening renal function, now CKD stage V and not a dialysis candidate. S/p IV lasix 80 mg x 2 one dose on admission and one the following day. Patient reported good urine output and but says his breathing is only slightly better than on admission. I/Os net -647 cc/24hrs and weight is stable, down 9 lbs since admission.  -- Will hold off on further diuresis at this time given worsening renal function  -- Transition to home hospice per palliative recommendations  CKD stage V:Renal function has progressively worsened over the past 8 months (creatine 3.45 ->7.8and BUN 42 ->83). Evaluated by Dr. Lawana Paiolodonado in April of this year and was told he is not a dialysis candidate, recommended hospice if he were to progress to ESRD. Patient diuresed well with lasix, reported good urine output. But renal function has continued to decline 7.8 -> 8.2 -> 9.2. -- Home hospice    Anemia: Currently asymptomatic. Hemoglobin has remained stable since transfusion yesterday, hgb 8.4 today.  FOBT was positive in ED. Patient is on warfarin for mechanical AVR. Recent colonoscopy in 2015 showed severe diverticular disease. Upper endoscopy revealed severe gastritis, duodenitis, hiatal hernia, and distal esophageal stricture s/p empiric dilation. Patient also has a known ascending aortic aneurysm, stable per CT chest in September of this year.  -- Continue PPI -- S/p 1 unit pRBCs  -- Discontinue lab check  Anticoagulation: Mechanical AVR. INR 3.84 on admission.  -- Discussed with pharmacy; will plan to discharge on 2 mg daily (half of home dose)  Paroxysmal Atrial Fibrillation / MAT / hx of SVT: EKG with a-fib on admission but rates continue to be well controlled. Patient has a pacemaker. Follows with Dr. Ladona Ridgelaylor.  -- Discontinue cardiac monitoring  -- Continue amiodarone 200 mg daily -- Continue diltiazem 180 mg daily   Thoracic Ascending Aortic Aneurysm: Stable per chest CT in September. Has been evaluated by Dr. Barry Dieneswens who felt he is not a good surgical candidate.   Hypothyroidism: -- Continue home synthroid 112 mcg daily   FEN: fluid restrict, replete lytes prn, heart healthy  VTE ppx: Warfarin for AVR  Code Status: DNR  Dispo: Anticipated discharge today pending home hospice.   Reymundo Pollarolyn Rosslyn Pasion, MD 10/25/2016, 6:52 AM Pager: [phone]

## 2016-10-25 NOTE — Progress Notes (Signed)
[facility]; [place], DME will be delivered to the home today by [facility] between the hours of 2-3 pm. Patient will be transported home via nonemergent ambulance [facility]; paperwork for ambulance is placed on the shadow chart. Spouse at home awaiting on arrival of Equipment, daughters are at the bedside at present. Abelino DerrickB Yolandra Habig Berger HospitalRN,MHA,BSN [phone]

## 2016-10-25 NOTE — Discharge Summary (Signed)
Name: [name] [name] MRN: [id number] DOB: [birth year] 82 y.o. PCP: [name] [name]  Date of Admission: 10/22/2016  9:15 AM Date of Discharge: 10/25/2016 Attending Physician: [name], [name]  Discharge Diagnosis: 1. Acute on chronic combined systolic and diastolic heart failure  2. ESRD not a dialysis candidate  3. Home hospice   Discharge Medications:   Medication List    STOP taking these medications   ACIDOPHILUS PO     TAKE these medications   acetaminophen 325 MG tablet Commonly known as:  TYLENOL Take 325 mg by mouth every 6 (six) hours as needed for moderate pain or headache.   alfuzosin 10 MG 24 hr tablet Commonly known as:  UROXATRAL Take 10 mg by mouth daily.   amiodarone 200 MG tablet Commonly known as:  PACERONE Take 200 mg by mouth daily.   diltiazem 180 MG 24 hr capsule Commonly known as:  CARDIZEM CD TAKE 1 CAPSULE BY MOUTH DAILY. What changed:  See the new instructions.   finasteride 5 MG tablet Commonly known as:  PROSCAR Take 5 mg by mouth daily.   furosemide 20 MG tablet Commonly known as:  LASIX Take 20-40 mg by mouth daily as needed for fluid.   ipratropium 0.06 % nasal spray Commonly known as:  ATROVENT Place 1-2 sprays into both nostrils 2 (two) times daily as needed for rhinitis.   Levothyroxine Sodium 112 MCG Caps Take 112 mcg by mouth daily.   lidocaine 5 % Commonly known as:  LIDODERM Place 3 patches onto the skin daily. Remove & Discard patch within 12 hours or as directed by [name]   polyethylene glycol packet Commonly known as:  MIRALAX / GLYCOLAX Take 17 g by mouth daily as needed. Until stooling regularly   traMADol 50 MG tablet Commonly known as:  ULTRAM Take 100 mg by mouth 3 (three) times daily.   warfarin 2 MG tablet Commonly known as:  COUMADIN Take 1 tablet (2 mg total) by mouth daily. Take one tablet (12m) daily What changed:  medication strength  how much to take  when to take  this  additional instructions       Disposition and follow-up:   [facility]  At the hospital follow up visit please address:  1.  Comfort care measures: Patient presented in acute CHF exacerbation complicated by progressively worsening renal function, now CKD stage 5 and deemed not to be a dialysis candidate. Palliative care was consulted and the decision was made to transition to home hospice. Palliative care recommendations as below:  SUMMARY OF RECOMMENDATIONS -Referral for home with Hospice -Morphine intensol 2.519mq 1 hr prn for SOB -lidoderm patches to bil shoulders and L knee -Durable DNR to chart   2.  Labs / imaging needed at time of follow-up: None  3.  Pending labs/ test needing follow-up: None   Follow-up Appointments: Follow-up Information    [name].   Specialty:  Family Medicine Contact information: [address] 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 Acute on chronic combined systolic and diastolic heart failure: Patient presented with progressively worsening dyspnea on exertion and SOB, found to be hypervolemic on exam. Last ECHO in 07/2016 showed mild to moderately reduced  systolic function, EF 42-68%, and diffuse hypokinesis. CXR on admission was consistent with congestive changes and BNP elevated > 2,000. Patient reported taking 20 mg lasix daily at home plus an additional 20 mg prn for swelling and weight gain, however, felt that the medicine was no longer working as well as it used to. His weight was about 9 lbs over his dry weight on presentation. He denied chest pain, but EKG showed some new ST depression in V5-V6 and troponin was mildly elevated 0.20 -> 0.25 -> 0.25, likely due to demand ischemia. He was given two doses of IV lasix and responded well with good urine output. Weight came down to 200 from 209 and patient reported improvement in his breathing. Renal function continued to decline with diuresis. Creatinine was 7.82 on admission and increased to 8.10 -> 8.23 -> 9.23 with lasix.   2. CKD Stage V: Renal function has progressively worsened over the past 8 months (creatine 3.45 -> 7.8 and BUN 42 -> 83 on admission labs). He was evaluated by Dr. Vanita Panda in April of this year and was told he is not a dialysis candidate, recommended hospice if he were to progress to ESRD. Patient diuresed well with lasix and reported improvement in his SOB, but renal function continued to decline. Palliative care was consulted and the decision was made to more forward with home hospice planning.  3. Anemia: Hemoglobin was 8.0 on admission and dropped to 7.3 later that evening. FOBT was positive in the ED. Patient is on warfarin for mechanical AVR. Recent colonoscopy in 2015 showed severe diverticular disease. Upper endoscopy revealed severe gastritis, duodenitis, hiatal hernia, and distal esophageal stricture s/p empiric dilation. Patient also has a known ascending aortic aneurysm, stable per CT chest in September of this year. Patient was started on daily protonix but this was discontinued on discharge. He was transfused 1 unit pRBCs with good response. Post transfusion hemoglobin  was 8.5 and remained stable at 8.4 the following day.   4. Anticoagulation: On warfarin for a mechanical AVR. INR was 3.84 on admission. Warfarin was initially held and managed per pharmacy recommendations. He received two, 1 mg doses of warfarin and INR was 3.75 on discharge. Discussed with pharmacy. Will plan to continue 2 mg daily (half of home dose) on discharge.   5. Paroxysmal Atrial Fibrillation / MAT / hx of SVT: EKG with a-fib on admission but rates remained well controlled during his hospitalization. Patient has a pacemaker and follows with Dr. Lovena Le. He had one episode of non sustained v-tach (5 beats) captured on tele. Cardiac monitoring was discontinued on day 3. Home meds amiodarone 200 mg and diltiazem 180 mg daily were continued.   Discharge Vitals:   BP (!) 131/49 (BP Location: Right Arm)   Pulse 79   Temp 98.8 F (37.1 C) (Oral)   Resp 18   Ht 6' (1.829 m)   Wt 200 lb 9.9 oz (91 kg)  Comment: bedscale  SpO2 98%   BMI 27.21 kg/m   Pertinent Labs, Studies, and Procedures:   10/22/2016 CXR 2 View: IMPRESSION: 1. Borderline mild congestive heart failure. 2. Small bilateral pleural effusions and left basilar atelectasis. 3. Aortic atherosclerosis.  Labs:   Ref. Range 10/22/2016 09:46 10/22/2016 21:19 10/23/2016 09:21 10/24/2016 03:32  Sodium Latest Ref Range: 135 - 145 mmol/L 139 137 135 138  Potassium Latest Ref Range: 3.5 - 5.1 mmol/L 5.2 (H) 4.7 4.5 4.4  Chloride Latest Ref Range: 101 - 111 mmol/L 110 109 108 108  CO2 Latest Ref Range: 22 - 32 mmol/L 19 (L) 20 (L) 17 (L) 21 (L)  BUN Latest Ref Range: 6 - 20 mg/dL 83 (H) 84 (H) 85 (H) 90 (H)  Creatinine Latest Ref Range: 0.61 - 1.24 mg/dL 7.82 (H) 8.10 (H) 8.23 (H) 9.23 (H)  Calcium Latest Ref Range: 8.9 - 10.3 mg/dL 8.3 (L) 8.0 (L) 7.9 (L) 8.1 (L)  EGFR (Non-African Amer.) Latest Ref Range: >60 mL/min 5 (L) 5 (L) 5 (L) 4 (L)  EGFR (African American) Latest Ref Range: >60 mL/min 6 (L) 6 (L) 6 (L) 5 (L)  Glucose  Latest Ref Range: 65 - 99 mg/dL 114 (H) 103 (H) 118 (H) 109 (H)  Anion gap Latest Ref Range: 5 - _0 Ref. Range 10/22/2016 09:46 10/22/2016 21:19 10/23/2016 09:21 10/24/2016 03:32  WBC Latest Ref Range: 4.0 - 10.5 K/uL 2.7 (L) 6.4 7.5 7.3  RBC Latest Ref Range: 4.22 - 5.81 MIL/uL 2.69 (L) 2.49 (L) 2.96 (L) 2.86 (L)  Hemoglobin Latest Ref Range: 13.0 - 17.0 g/dL 8.0 (L) 7.3 (L) 8.5 (L) 8.4 (L)  HCT Latest Ref Range: 39.0 - 52.0 % 25.0 (L) 23.0 (L) 26.6 (L) 25.3 (L)  MCV Latest Ref Range: 78.0 - 100.0 fL 92.9 92.4 89.9 88.5  MCH Latest Ref Range: 26.0 - 34.0 pg 29.7 29.3 28.7 29.4  MCHC Latest Ref Range: 30.0 - 36.0 g/dL 32.0 31.7 32.0 33.2  RDW Latest Ref Range: 11.5 - 15.5 % 15.6 (H) 15.5 15.6 (H) 15.6 (H)  Platelets Latest Ref Range: 150 - 400 K/uL 189 158 178 181    Ref. Range 10/22/2016 09:46 10/22/2016 15:32 10/22/2016 21:19  Troponin I Latest Ref Range: <0.03 ng/mL 0.20 (HH) 0.25 (HH) 0.25 (HH)    Ref. Range 10/22/2016 09:46  B Natriuretic Peptide Latest Ref Range: 0.0 - 100.0 pg/mL 2,476.4 (H)   Urinalysis:  Ref. Range 10/22/2016 19:58  Appearance Latest Ref Range: CLEAR  CLOUDY (A)  Bacteria, UA Latest Ref Range: NONE SEEN  RARE (A)  Bilirubin Urine Latest Ref Range: NEGATIVE  NEGATIVE  Casts Latest Ref Range: NEGATIVE  HYALINE CASTS (A)  Color, Urine Latest Ref Range: YELLOW  YELLOW  Glucose Latest Ref Range: NEGATIVE mg/dL NEGATIVE  Hgb urine dipstick Latest Ref Range: NEGATIVE  LARGE (A)  Ketones, ur Latest Ref Range: NEGATIVE mg/dL NEGATIVE  Leukocytes, UA Latest Ref Range: NEGATIVE  NEGATIVE  Nitrite Latest Ref Range: NEGATIVE  NEGATIVE  pH Latest Ref Range: 5.0 - 8.0  5.0  Protein Latest Ref Range: NEGATIVE mg/dL 30 (A)  RBC / HPF Latest Ref Range: 0 - 5 RBC/hpf TOO NUMEROUS TO C...  Specific Gravity, Urine Latest Ref Range: 1.005 - 1.030  1.009  Squamous Epithelial / LPF Latest Ref Range: NONE SEEN  0-5 (A)  WBC, UA Latest Ref Range: 0 - 5 WBC/hpf 0-5    Discharge Instructions: Discharge Instructions  Call [name] for:  difficulty breathing, headache or visual disturbances    Complete by:  As directed    Call [name] for:  severe uncontrolled pain    Complete by:  As directed    Call [name] for:  temperature >100.4    Complete by:  As directed    Diet - low sodium heart healthy    Complete by:  As directed    Discharge instructions    Complete by:  As directed    Please continue taking your medications as prescribed. Your warfarin dose has been decreased to one 2 mg tablet daily. A new prescription has been sent to your pharmacy. It was a pleasure taking care of you, Mr. Tremaine. Please let me know if I can do anything for you prior to discharge. I will be thinking of you and your family.   - Dr. Philipp Ovens   Increase activity slowly    Complete by:  As directed       Signed: Velna Ochs, [name] 10/25/2016, 11:09 AM   Pager: [phone]

## 2016-10-25 NOTE — Progress Notes (Signed)
Patient is restful with daughters at bedside. Per Family Equipment arrived at home house to be set-up at 4:15pm. [facility]

## 2016-12-19 IMAGING — CT CT HEART MORP W/ CTA COR W/ SCORE W/ CA W/CM &/OR W/O CM
1 of 5 series · 1 of 20 positions shown, 2 images · non-contrast
Comparison: Chest CTA 11/28/2014.

MEDICATIONS:
Lopressor:  50  mg, P.O.

CLINICAL DATA: 86-year-old male with history of Bentall procedure
for aortic valve stenosis performed in 9003. Possible dissection or
pseudoaneurysm of the distal ascending thoracic aorta noted on prior
CTA of the chest 11/28/2014.

[Series 300: locator · axial · 0.35mm/px · z∈[-218,-218]mm · 1 of 1 slices shown, 2 images]
[im 1/1  vessel]
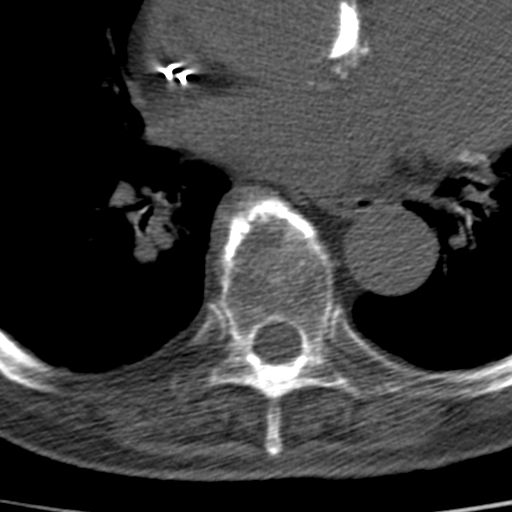
[im 1/1  lung]
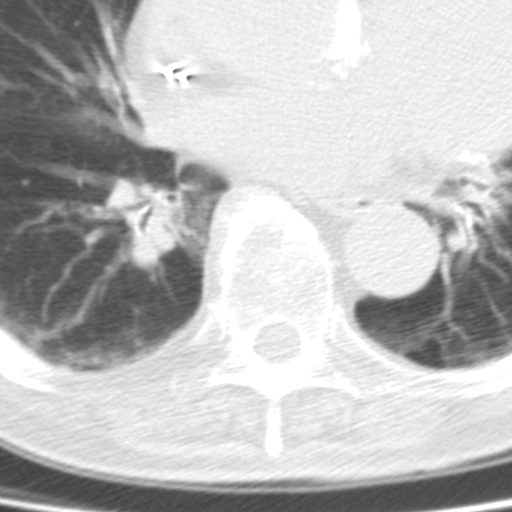

[1 of 20 positions shown; findings below may reference images not displayed]

EXAM:
CT ANGIOGRAPHY OF THE HEART, CORONARY ARTERY, STRUCTURE, AND
MORPHOLOGY

PROCEDURE:
CT angiography of the coronary vessels was performed on a 256
channel system using prospective ECG gating. A scout and ECG-gated
noncontrast exam (for calcium scoring) were performed. Appropriate
delay was determined by bolus tracking after injection of iodinated
contrast, and an ECG-gated coronary CTA was performed with sub-mm
slice collimation during late diastole. Imaging post processing was
performed on an independent workstation creating multiplanar and 3-D
images, allowing for quantitative analysis of the heart and coronary
arteries. Note that this exam targets the heart and the chest was
not imaged in its entirety.
FINDINGS: Technical quality:  Excellent.

Heart rate:  61

CORONARY ARTERIES:

Coronary arteries were incompletely imaged, and are largely not
interpretable secondary to a combination of extensive coronary
artery calcification and cardiac motion. Multivessel coronary artery
disease is noted, most severe in the left anterior descending
coronary artery.

CORONARY CALCIUM:

Calcium scoring was not performed.

OTHER FINDINGS:

Mediastinum/Lymph Nodes: Post procedural changes of Bentall
procedure noted, with a mechanical aortic valve (Klpigbb Moolman type
valve), and an ascending aortic graft noted. Along the right side of
the distal graft, originating at or immediately below the level of
the sewing ring, there is a small focal outpouching of contrast,
which extends over a length of approximately 2.3 cm craniocaudally
and measures approximately 1.8 x 0.9 cm transaxially. The neck of
this apparent pseudoaneurysm is visualized on image 178 of series
501. No extension of the pseudoaneurysm above the sewing ring is
identified. The proximal aortic arch is mildly aneurysmal measuring
4.4 cm in diameter. The mid arch, isthmus and descending thoracic
aorta measure 3.2 cm, 3.4 cm and 2.9 cm in diameter respectively.

Sinuses of Valsalva are dilated measuring up to 4.7 cm in diameter.
Mild kinking of the ascending thoracic aortic graft, of no
hemodynamic significance. Study was focused on the area of concern
in the ascending thoracic aorta, so the entire thorax was not
covered. Within the visualized portions of the thorax there are
several borderline dilated and mildly dilated mediastinal lymph
nodes, measuring up to 11 mm in short axis, likely chronic in
reactive. Esophagus is unremarkable in appearance. No axillary
lymphadenopathy. Left-sided pacemaker in position with lead tips
terminating in the right atrial appendage and right ventricular
apex.

Lungs/Pleura: Mild diffuse bronchial wall thickening. No acute
consolidative airspace disease. No pleural effusions. Mild scarring
in the lung bases, particularly in the inferior segment of the
lingula. No suspicious appearing pulmonary nodules or masses are
noted in the visualized portions of the lungs.

Musculoskeletal/Soft Tissues: Median sternotomy wires. There are no
aggressive appearing lytic or blastic lesions noted in the
visualized portions of the skeleton.
IMPRESSION: 1. Small pseudoaneurysm off the right side of the distal ascending
thoracic aorta, which appears to originate at or immediately beneath
the level of the sewing ring for the patient's ascending thoracic
aortic graft. This measures approximately 2.3 x 1.8 x 0.9 cm on
today's examination, and appears only slightly larger than prior
study from 11/28/2014. No extension distal to the sewing ring into
the upper ascending thoracic aorta or aortic arch.
2. Mild aneurysmal dilatation of the sinuses of Valsalva and the
proximal aortic arch.
3. Additional incidental findings, as above.
These results were called by telephone at the time of interpretation
on 02/16/2015 at [DATE] to Dr. KARTIK COLLETTE, who verbally
acknowledged these results.

## 2016-12-26 DEATH — deceased
